# Patient Record
Sex: Female | Born: 1975 | Race: Black or African American | Hispanic: No | Marital: Single | Smoking: Former smoker
Health system: Southern US, Community
[De-identification: ages and names within clinical notes are randomized; demographics above are authoritative.]

## PROBLEM LIST (undated history)

## (undated) DIAGNOSIS — E119 Type 2 diabetes mellitus without complications: Secondary | ICD-10-CM

## (undated) DIAGNOSIS — M79604 Pain in right leg: Secondary | ICD-10-CM

## (undated) DIAGNOSIS — K219 Gastro-esophageal reflux disease without esophagitis: Secondary | ICD-10-CM

## (undated) DIAGNOSIS — F909 Attention-deficit hyperactivity disorder, unspecified type: Secondary | ICD-10-CM

## (undated) DIAGNOSIS — M542 Cervicalgia: Secondary | ICD-10-CM

## (undated) DIAGNOSIS — F419 Anxiety disorder, unspecified: Secondary | ICD-10-CM

## (undated) DIAGNOSIS — Z8489 Family history of other specified conditions: Secondary | ICD-10-CM

## (undated) DIAGNOSIS — Z87442 Personal history of urinary calculi: Secondary | ICD-10-CM

## (undated) HISTORY — DX: Pain in right leg: M79.604

## (undated) HISTORY — PX: OTHER SURGICAL HISTORY: SHX169

## (undated) HISTORY — DX: Attention-deficit hyperactivity disorder, unspecified type: F90.9

## (undated) HISTORY — DX: Type 2 diabetes mellitus without complications: E11.9

## (undated) HISTORY — PX: CHOLECYSTECTOMY: SHX55

## (undated) HISTORY — DX: Anxiety disorder, unspecified: F41.9

## (undated) HISTORY — DX: Cervicalgia: M54.2

---

## 2000-11-29 ENCOUNTER — Emergency Department (HOSPITAL_COMMUNITY): Admission: EM | Admit: 2000-11-29 | Discharge: 2000-11-29 | Payer: Self-pay | Admitting: Emergency Medicine

## 2001-01-26 ENCOUNTER — Emergency Department (HOSPITAL_COMMUNITY): Admission: EM | Admit: 2001-01-26 | Discharge: 2001-01-26 | Payer: Self-pay | Admitting: Internal Medicine

## 2001-10-20 ENCOUNTER — Emergency Department (HOSPITAL_COMMUNITY): Admission: EM | Admit: 2001-10-20 | Discharge: 2001-10-20 | Payer: Self-pay | Admitting: Emergency Medicine

## 2004-01-03 ENCOUNTER — Encounter: Admission: RE | Admit: 2004-01-03 | Discharge: 2004-01-03 | Payer: Self-pay | Admitting: Obstetrics & Gynecology

## 2005-03-30 ENCOUNTER — Emergency Department (HOSPITAL_COMMUNITY): Admission: EM | Admit: 2005-03-30 | Discharge: 2005-03-30 | Payer: Self-pay | Admitting: Family Medicine

## 2005-04-02 ENCOUNTER — Ambulatory Visit: Payer: Self-pay | Admitting: Internal Medicine

## 2005-04-09 ENCOUNTER — Ambulatory Visit: Payer: Self-pay | Admitting: Internal Medicine

## 2005-05-14 ENCOUNTER — Ambulatory Visit: Payer: Self-pay | Admitting: Internal Medicine

## 2005-09-08 ENCOUNTER — Emergency Department (HOSPITAL_COMMUNITY): Admission: EM | Admit: 2005-09-08 | Discharge: 2005-09-08 | Payer: Self-pay | Admitting: Family Medicine

## 2005-10-12 ENCOUNTER — Emergency Department (HOSPITAL_COMMUNITY): Admission: EM | Admit: 2005-10-12 | Discharge: 2005-10-12 | Payer: Self-pay | Admitting: Family Medicine

## 2005-12-25 ENCOUNTER — Emergency Department (HOSPITAL_COMMUNITY): Admission: EM | Admit: 2005-12-25 | Discharge: 2005-12-25 | Payer: Self-pay | Admitting: Family Medicine

## 2005-12-28 ENCOUNTER — Emergency Department (HOSPITAL_COMMUNITY): Admission: EM | Admit: 2005-12-28 | Discharge: 2005-12-28 | Payer: Self-pay | Admitting: Family Medicine

## 2006-01-04 ENCOUNTER — Emergency Department (HOSPITAL_COMMUNITY): Admission: EM | Admit: 2006-01-04 | Discharge: 2006-01-04 | Payer: Self-pay | Admitting: Family Medicine

## 2006-03-13 ENCOUNTER — Emergency Department (HOSPITAL_COMMUNITY): Admission: EM | Admit: 2006-03-13 | Discharge: 2006-03-13 | Payer: Self-pay | Admitting: Emergency Medicine

## 2006-05-28 ENCOUNTER — Other Ambulatory Visit: Admission: RE | Admit: 2006-05-28 | Discharge: 2006-05-28 | Payer: Self-pay | Admitting: Gynecology

## 2006-06-28 ENCOUNTER — Ambulatory Visit: Payer: Self-pay | Admitting: Family Medicine

## 2006-11-25 ENCOUNTER — Emergency Department (HOSPITAL_COMMUNITY): Admission: EM | Admit: 2006-11-25 | Discharge: 2006-11-25 | Payer: Self-pay | Admitting: Emergency Medicine

## 2006-12-28 ENCOUNTER — Ambulatory Visit (HOSPITAL_COMMUNITY): Admission: RE | Admit: 2006-12-28 | Discharge: 2006-12-28 | Payer: Self-pay | Admitting: Obstetrics and Gynecology

## 2006-12-28 ENCOUNTER — Encounter (HOSPITAL_COMMUNITY): Payer: Self-pay | Admitting: Obstetrics and Gynecology

## 2007-02-11 ENCOUNTER — Ambulatory Visit: Payer: Self-pay | Admitting: Internal Medicine

## 2007-02-11 LAB — CONVERTED CEMR LAB
Chlamydia, Swab/Urine, PCR: NEGATIVE
GC Probe Amp, Urine: NEGATIVE

## 2007-09-16 ENCOUNTER — Emergency Department (HOSPITAL_COMMUNITY): Admission: EM | Admit: 2007-09-16 | Discharge: 2007-09-16 | Payer: Self-pay | Admitting: Family Medicine

## 2007-11-28 ENCOUNTER — Ambulatory Visit: Payer: Self-pay | Admitting: Internal Medicine

## 2008-01-13 ENCOUNTER — Other Ambulatory Visit: Admission: RE | Admit: 2008-01-13 | Discharge: 2008-01-13 | Payer: Self-pay | Admitting: Adult Health

## 2008-01-13 ENCOUNTER — Encounter (INDEPENDENT_AMBULATORY_CARE_PROVIDER_SITE_OTHER): Payer: Self-pay | Admitting: Adult Health

## 2008-01-13 ENCOUNTER — Ambulatory Visit: Payer: Self-pay | Admitting: Internal Medicine

## 2008-01-13 LAB — CONVERTED CEMR LAB
ALT: 11 units/L (ref 0–35)
AST: 15 units/L (ref 0–37)
Albumin: 4.3 g/dL (ref 3.5–5.2)
Alkaline Phosphatase: 44 units/L (ref 39–117)
BUN: 11 mg/dL (ref 6–23)
Basophils Absolute: 0 10*3/uL (ref 0.0–0.1)
Basophils Relative: 1 % (ref 0–1)
CO2: 24 meq/L (ref 19–32)
Calcium: 9 mg/dL (ref 8.4–10.5)
Chlamydia, DNA Probe: NEGATIVE
Chloride: 103 meq/L (ref 96–112)
Cholesterol: 160 mg/dL (ref 0–200)
Creatinine, Ser: 0.66 mg/dL (ref 0.40–1.20)
Eosinophils Absolute: 0.1 10*3/uL (ref 0.0–0.7)
Eosinophils Relative: 1 % (ref 0–5)
GC Probe Amp, Genital: NEGATIVE
Glucose, Bld: 79 mg/dL (ref 70–99)
HCT: 39.5 % (ref 36.0–46.0)
HDL: 51 mg/dL (ref >39)
Hemoglobin: 12.2 g/dL (ref 12.0–15.0)
LDL Cholesterol: 98 mg/dL (ref 0–99)
Lymphocytes Relative: 38 % (ref 12–46)
Lymphs Abs: 2.5 10*3/uL (ref 0.7–4.0)
MCHC: 30.9 g/dL (ref 30.0–36.0)
MCV: 78.7 fL (ref 78.0–100.0)
Monocytes Absolute: 0.4 10*3/uL (ref 0.1–1.0)
Monocytes Relative: 6 % (ref 3–12)
Neutro Abs: 3.6 10*3/uL (ref 1.7–7.7)
Neutrophils Relative %: 55 % (ref 43–77)
Platelets: 296 10*3/uL (ref 150–400)
Potassium: 4.1 meq/L (ref 3.5–5.3)
RBC: 5.02 M/uL (ref 3.87–5.11)
RDW: 15 % (ref 11.5–15.5)
Sodium: 142 meq/L (ref 135–145)
TSH: 0.635 microintl units/mL (ref 0.350–4.50)
Total Bilirubin: 0.7 mg/dL (ref 0.3–1.2)
Total CHOL/HDL Ratio: 3.1
Total Protein: 7.4 g/dL (ref 6.0–8.3)
Triglycerides: 56 mg/dL (ref <150)
VLDL: 11 mg/dL (ref 0–40)
WBC: 6.6 10*3/uL (ref 4.0–10.5)

## 2008-08-13 ENCOUNTER — Emergency Department (HOSPITAL_COMMUNITY): Admission: EM | Admit: 2008-08-13 | Discharge: 2008-08-13 | Payer: Self-pay | Admitting: Family Medicine

## 2008-09-26 ENCOUNTER — Emergency Department (HOSPITAL_COMMUNITY): Admission: EM | Admit: 2008-09-26 | Discharge: 2008-09-26 | Payer: Self-pay | Admitting: Family Medicine

## 2008-09-28 ENCOUNTER — Encounter: Admission: RE | Admit: 2008-09-28 | Discharge: 2008-09-28 | Payer: Self-pay | Admitting: Family Medicine

## 2010-04-12 LAB — HEMOCCULT GUIAC POC 1CARD (OFFICE): Fecal Occult Bld: NEGATIVE

## 2010-04-15 ENCOUNTER — Inpatient Hospital Stay (INDEPENDENT_AMBULATORY_CARE_PROVIDER_SITE_OTHER)
Admission: RE | Admit: 2010-04-15 | Discharge: 2010-04-15 | Disposition: A | Discharge status: Home / Self Care | Payer: Self-pay | Source: Ambulatory Visit | Attending: Family Medicine | Admitting: Family Medicine

## 2010-04-15 DIAGNOSIS — L2089 Other atopic dermatitis: Secondary | ICD-10-CM

## 2010-05-20 NOTE — Op Note (Signed)
NAME:  [name], [name]               ACCOUNT NO.:  [id number]   MEDICAL RECORD NO.:  [id number]          PATIENT TYPE:  AMB   LOCATION:  [facility]C                           FACILITY:  [facility]   PHYSICIAN:  Zelphia Cairo, MD    DATE OF BIRTH:  1975-07-30   DATE OF PROCEDURE:  12/28/2006  DATE OF DISCHARGE:                               OPERATIVE REPORT   PREOPERATIVE DIAGNOSIS:  1. Missed abortion.  2. Anembryonic pregnancy.   POSTOPERATIVE DIAGNOSIS:  1. Missed abortion.  2. Anembryonic pregnancy.   PROCEDURE:  1. Cervical block.  2. Suction D&E.   SURGEON:  Zelphia Cairo, MD.   ASSISTANT:  None.   ANESTHESIA:  MAC and local.   SPECIMEN:  Products of conception to pathology.   ESTIMATED BLOOD LOSS:  Minimal.   COMPLICATIONS:  None.   CONDITION:  Stable to recovery room.   DESCRIPTION OF PROCEDURE:  The patient was taken to the operating room  where MAC anesthesia was obtained. She was placed in the dorsal  lithotomy position using Allen stirrups.  She was prepped and draped in  sterile fashion and an in-and-out catheter was used to drain her bladder  for approximately 20 mL of clear urine.  A bivalve speculum was placed  in the vagina and a single-tooth tenaculum was placed on the anterior  lip of the cervix. Nesacaine was used to provide a cervical block.  The  cervix was then easily dilated using Pratt dilators and a 7-French  suction catheter was inserted into the uterine cavity and products of  conception were evacuated from the uterus.  A gentle curette was then  performed to ensure uterine cry and that all products of conception had  been  removed.  The suction catheter was then reinserted to remove all clots  and debris. A single-tooth tenaculum and speculum were removed and the  patient was taken to the recovery room in stable condition.  Sponge,  lap, needle and instrument counts were correct x2.      Zelphia Cairo, MD  Electronically Signed     GA/MEDQ   D:  12/28/2006  T:  12/28/2006  Job:  [id number]

## 2010-05-23 NOTE — Letter (Signed)
July 23, 2005     Ms. Rose [name]  [address]  [place], [place] [address]   RE:  [name], Rose  MRN:  [id number]  /  DOB:  [birth year]   Dear Ms. Gariepy:   You have failed to show up for your colonoscopy.  As you may recall, this  has been scheduled to investigate the bleeding and bowel habits changes you  have had.   This is an important medical test to help understand why you have these  problems.   I recommend that you call our office and arrange a followup visit with me to  discuss these matters to try to complete your evaluation.    Sincerely,      Iva Boop, MD, [facility]   CEG/MedQ  DD:  07/23/2005  DT:  07/24/2005  Job #:  [id number]

## 2010-10-10 LAB — CBC
HCT: 35.8 — ABNORMAL LOW
Hemoglobin: 12
MCHC: 33.4
MCV: 78.2
Platelets: 252
RBC: 4.58
RDW: 14.3
WBC: 8.2

## 2010-10-14 LAB — POCT URINALYSIS DIP (DEVICE)
Glucose, UA: NEGATIVE
Ketones, ur: 15 — AB
Nitrite: NEGATIVE
Operator id: 247071
Protein, ur: 30 — AB
Specific Gravity, Urine: 1.02
Urobilinogen, UA: 1
pH: 7

## 2010-10-14 LAB — POCT PREGNANCY, URINE
Operator id: 247071
Preg Test, Ur: POSITIVE

## 2012-09-13 ENCOUNTER — Encounter: Payer: 59 | Attending: Internal Medicine

## 2012-09-13 VITALS — Ht 65.0 in | Wt 162.9 lb

## 2012-09-13 DIAGNOSIS — E119 Type 2 diabetes mellitus without complications: Secondary | ICD-10-CM | POA: Insufficient documentation

## 2012-09-13 DIAGNOSIS — Z713 Dietary counseling and surveillance: Secondary | ICD-10-CM | POA: Insufficient documentation

## 2012-09-20 NOTE — Patient Instructions (Signed)
Goals:  Follow Diabetes Meal Plan as instructed  Eat 3 meals and 2 snacks, every 3-5 hrs  Limit carbohydrate intake to 45 grams carbohydrate/meal  Limit carbohydrate intake to 30 grams carbohydrate/snack  Add lean protein foods to meals/snacks  Monitor glucose levels as instructed by your doctor  Aim for 15-30 mins of physical activity daily  Bring food record and glucose log to your next nutrition visit

## 2012-09-20 NOTE — Progress Notes (Signed)
Patient was seen on 09/13/12 for the first of a series of three diabetes self-management courses at the [facility].   Current HbA1c: 9.3%  The following learning objectives were met by the patient during this course:   Defines the role of glucose and insulin  Identifies type of diabetes and pathophysiology  Defines the diagnostic criteria for diabetes and prediabetes  States the risk factors for Type 2 Diabetes  States the symptoms of Type 2 Diabetes  Defines Type 2 Diabetes treatment goals  Defines Type 2 Diabetes treatment options  States the rationale for glucose monitoring  Identifies A1C, glucose targets, and testing times  Identifies proper sharps disposal  Defines the purpose of a diabetes food plan  Identifies carbohydrate food groups  Defines effects of carbohydrate foods on glucose levels  Identifies carbohydrate choices/grams/food labels  States benefits of physical activity and effect on glucose  Review of suggested activity guidelines  Handouts given during class include:  Type 2 Diabetes: Basics Book  My Food Plan Book  Food and Activity Log  Your patient has identified their diabetes self-care support plan as:  [facility] support group  Continued diabetes education  Follow-Up Plan: Attend core 2 and core 3

## 2012-10-18 ENCOUNTER — Encounter: Payer: 59 | Attending: Internal Medicine

## 2012-10-18 DIAGNOSIS — E119 Type 2 diabetes mellitus without complications: Secondary | ICD-10-CM | POA: Insufficient documentation

## 2012-10-18 DIAGNOSIS — Z713 Dietary counseling and surveillance: Secondary | ICD-10-CM | POA: Insufficient documentation

## 2012-10-25 ENCOUNTER — Encounter: Payer: 59 | Attending: Internal Medicine

## 2012-10-25 DIAGNOSIS — Z713 Dietary counseling and surveillance: Secondary | ICD-10-CM | POA: Insufficient documentation

## 2012-10-25 DIAGNOSIS — E119 Type 2 diabetes mellitus without complications: Secondary | ICD-10-CM

## 2012-10-26 NOTE — Progress Notes (Signed)
Patient was seen on 10/25/12 for the third of a series of three diabetes self-management courses at the [facility]. The following learning objectives were met by the patient during this class:    State the amount of activity recommended for healthy living   Describe activities suitable for individual needs   Identify ways to regularly incorporate activity into daily life   Identify barriers to activity and ways to over come these barriers  Identify diabetes medications being personally used and their primary action for lowering glucose and possible side effects   Describe role of stress on blood glucose and develop strategies to address psychosocial issues   Identify diabetes complications and ways to prevent them  Explain how to manage diabetes during illness   Evaluate success in meeting personal goal   Establish 2-3 goals that they will plan to diligently work on until they return for the free 63-month follow-up visit  Your patient has established the following 4 month goals in their individualized success plan:  Count Carbohydrates at most meals and snacks  Be active 30 minutes or more 5 times a week  Take my medications as directed  Your patient has identified these potential barriers to change:  None stated  Your patient has identified their diabetes self-care support plan as  None stated

## 2012-12-06 DIAGNOSIS — J4520 Mild intermittent asthma, uncomplicated: Secondary | ICD-10-CM | POA: Insufficient documentation

## 2012-12-06 DIAGNOSIS — F418 Other specified anxiety disorders: Secondary | ICD-10-CM | POA: Insufficient documentation

## 2012-12-06 DIAGNOSIS — Z3041 Encounter for surveillance of contraceptive pills: Secondary | ICD-10-CM | POA: Insufficient documentation

## 2012-12-06 DIAGNOSIS — E1165 Type 2 diabetes mellitus with hyperglycemia: Secondary | ICD-10-CM | POA: Insufficient documentation

## 2013-03-04 ENCOUNTER — Emergency Department: Payer: Self-pay | Admitting: Emergency Medicine

## 2013-04-20 DIAGNOSIS — F39 Unspecified mood [affective] disorder: Secondary | ICD-10-CM | POA: Insufficient documentation

## 2013-06-30 ENCOUNTER — Emergency Department: Payer: Self-pay | Admitting: Internal Medicine

## 2013-08-18 ENCOUNTER — Encounter (HOSPITAL_COMMUNITY): Payer: Self-pay

## 2013-08-18 ENCOUNTER — Encounter (HOSPITAL_COMMUNITY)
Admission: RE | Admit: 2013-08-18 | Discharge: 2013-08-18 | Disposition: A | Discharge status: Home / Self Care | Payer: 59 | Source: Ambulatory Visit | Attending: Obstetrics and Gynecology | Admitting: Obstetrics and Gynecology

## 2013-08-18 DIAGNOSIS — Z01812 Encounter for preprocedural laboratory examination: Secondary | ICD-10-CM | POA: Insufficient documentation

## 2013-08-18 LAB — BASIC METABOLIC PANEL
Anion gap: 12 (ref 5–15)
BUN: 14 mg/dL (ref 6–23)
CO2: 25 mEq/L (ref 19–32)
Calcium: 9.2 mg/dL (ref 8.4–10.5)
Chloride: 102 mEq/L (ref 96–112)
Creatinine, Ser: 0.81 mg/dL (ref 0.50–1.10)
GFR calc Af Amer: >90 mL/min (ref >90)
GFR calc non Af Amer: >90 mL/min (ref >90)
Glucose, Bld: 111 mg/dL — ABNORMAL HIGH (ref 70–99)
Potassium: 3.8 mEq/L (ref 3.7–5.3)
Sodium: 139 mEq/L (ref 137–147)

## 2013-08-18 LAB — CBC
HCT: 37.6 % (ref 36.0–46.0)
Hemoglobin: 12.4 g/dL (ref 12.0–15.0)
MCH: 24.8 pg — ABNORMAL LOW (ref 26.0–34.0)
MCHC: 33 g/dL (ref 30.0–36.0)
MCV: 75.2 fL — ABNORMAL LOW (ref 78.0–100.0)
Platelets: 268 10*3/uL (ref 150–400)
RBC: 5 MIL/uL (ref 3.87–5.11)
RDW: 15.7 % — ABNORMAL HIGH (ref 11.5–15.5)
WBC: 7.4 10*3/uL (ref 4.0–10.5)

## 2013-08-18 NOTE — Patient Instructions (Addendum)
Your procedure is scheduled on:08/24/13  Enter through the Main Entrance at :[facility] up desk phone and dial [phone] and inform us of your arrival.  Please call [phone] if you have any problems the morning of surgery.  Remember: Do not eat food or drink liquids, including water, after midnight:WED  You may brush your teeth the morning of surgery. Do not take morning meds before surgery  DO NOT wear jewelry, eye make-up, lipstick,body lotion, or dark fingernail polish.  (Polished toes are ok) You may wear deodorant.  If you are to be admitted after surgery, leave suitcase in car until your room has been assigned. Patients discharged on the day of surgery will not be allowed to drive home. Wear loose fitting, comfortable clothes for your ride home.

## 2013-08-23 NOTE — H&P (Addendum)
Rose [name] is an 38 y.o. female with chronic pelvic pain presents for further evaluation.  Started on DMPA for menorrhagia and dysmenorrhea.  Bleeding has improved but pain persists.      Past Medical History  Diagnosis Date  . Diabetes mellitus without complication   . Asthma   . Anxiety     Past Surgical History  Procedure Laterality Date  . Wrist, cyst removal    . Cesarean section    . Cholecystectomy      No family history on file.  Social History:  reports that she has never smoked. She does not have any smokeless tobacco history on file. She reports that she drinks alcohol. She reports that she does not use illicit drugs.  Allergies:  Allergies  Allergen Reactions  . Bactrim [Sulfamethoxazole-Tmp Ds] Swelling    No prescriptions prior to admission    ROS  Physical Exam  AF, VSS Gen - NAD Abd - soft, ND CV - RRR Lungs - clear PV - no masses  [place] 2.7cm fibroid.  No adnexal masses.  ? Adenomyosis.  No free fluid   Assessment/Plan:  Chronic pelvic pain Diagnostic laparoscopy with possible fulgeration of endometriosis R/b/a discussed, questions answered, informed consent  Rose [name] 08/23/2013, 5:14 PM

## 2013-08-24 ENCOUNTER — Ambulatory Visit (HOSPITAL_COMMUNITY)
Admission: RE | Admit: 2013-08-24 | Discharge: 2013-08-24 | Disposition: A | Discharge status: Home / Self Care | Payer: 59 | Source: Ambulatory Visit | Attending: Obstetrics and Gynecology | Admitting: Obstetrics and Gynecology

## 2013-08-24 ENCOUNTER — Encounter (HOSPITAL_COMMUNITY): Payer: Self-pay | Admitting: *Deleted

## 2013-08-24 ENCOUNTER — Ambulatory Visit (HOSPITAL_COMMUNITY): Payer: 59 | Admitting: Anesthesiology

## 2013-08-24 ENCOUNTER — Encounter (HOSPITAL_COMMUNITY): Payer: 59 | Admitting: Anesthesiology

## 2013-08-24 ENCOUNTER — Encounter (HOSPITAL_COMMUNITY)
Admission: RE | Disposition: A | Discharge status: Home / Self Care | Payer: Self-pay | Source: Ambulatory Visit | Attending: Obstetrics and Gynecology

## 2013-08-24 DIAGNOSIS — G8929 Other chronic pain: Secondary | ICD-10-CM | POA: Insufficient documentation

## 2013-08-24 DIAGNOSIS — N920 Excessive and frequent menstruation with regular cycle: Secondary | ICD-10-CM | POA: Insufficient documentation

## 2013-08-24 DIAGNOSIS — N949 Unspecified condition associated with female genital organs and menstrual cycle: Secondary | ICD-10-CM | POA: Diagnosis not present

## 2013-08-24 DIAGNOSIS — J45909 Unspecified asthma, uncomplicated: Secondary | ICD-10-CM | POA: Insufficient documentation

## 2013-08-24 DIAGNOSIS — N946 Dysmenorrhea, unspecified: Secondary | ICD-10-CM | POA: Diagnosis not present

## 2013-08-24 DIAGNOSIS — E119 Type 2 diabetes mellitus without complications: Secondary | ICD-10-CM | POA: Diagnosis not present

## 2013-08-24 DIAGNOSIS — F411 Generalized anxiety disorder: Secondary | ICD-10-CM | POA: Insufficient documentation

## 2013-08-24 HISTORY — PX: LAPAROSCOPY: SHX197

## 2013-08-24 LAB — GLUCOSE, CAPILLARY
Glucose-Capillary: 100 mg/dL — ABNORMAL HIGH (ref 70–99)
Glucose-Capillary: 102 mg/dL — ABNORMAL HIGH (ref 70–99)

## 2013-08-24 LAB — PREGNANCY, URINE: Preg Test, Ur: NEGATIVE

## 2013-08-24 SURGERY — LAPAROSCOPY OPERATIVE
Anesthesia: General | Site: Abdomen

## 2013-08-24 MED ORDER — DEXAMETHASONE SODIUM PHOSPHATE 10 MG/ML IJ SOLN
INTRAMUSCULAR | Status: DC | PRN
  Administered 2013-08-24: 4 mg via INTRAVENOUS

## 2013-08-24 MED ORDER — HYDROCODONE-IBUPROFEN 7.5-200 MG PO TABS: 1.0000 | ORAL_TABLET | Freq: Three times a day (TID) | ORAL | Status: DC | PRN

## 2013-08-24 MED ORDER — MIDAZOLAM HCL 2 MG/2ML IJ SOLN: 0.5000 mg | Freq: Once | INTRAMUSCULAR | Status: DC | PRN

## 2013-08-24 MED ORDER — SCOPOLAMINE 1 MG/3DAYS TD PT72
1.0000 | MEDICATED_PATCH | Freq: Once | TRANSDERMAL | Status: DC
  Administered 2013-08-24: 1.5 mg via TRANSDERMAL

## 2013-08-24 MED ORDER — HYDROMORPHONE HCL PF 1 MG/ML IJ SOLN
0.2500 mg | INTRAMUSCULAR | Status: DC | PRN
  Administered 2013-08-24: 0.5 mg via INTRAVENOUS

## 2013-08-24 MED ORDER — DEXAMETHASONE SODIUM PHOSPHATE 4 MG/ML IJ SOLN
INTRAMUSCULAR | Status: AC
  Filled 2013-08-24: qty 1

## 2013-08-24 MED ORDER — LIDOCAINE HCL (CARDIAC) 20 MG/ML IV SOLN
INTRAVENOUS | Status: AC
  Filled 2013-08-24: qty 5

## 2013-08-24 MED ORDER — GLYCOPYRROLATE 0.2 MG/ML IJ SOLN
INTRAMUSCULAR | Status: AC
  Filled 2013-08-24: qty 1

## 2013-08-24 MED ORDER — ONDANSETRON HCL 4 MG/2ML IJ SOLN
INTRAMUSCULAR | Status: DC | PRN
  Administered 2013-08-24: 4 mg via INTRAVENOUS

## 2013-08-24 MED ORDER — MIDAZOLAM HCL 2 MG/2ML IJ SOLN
INTRAMUSCULAR | Status: AC
Start: 2013-08-24 — End: 2013-08-24
  Filled 2013-08-24: qty 2

## 2013-08-24 MED ORDER — GLYCOPYRROLATE 0.2 MG/ML IJ SOLN
INTRAMUSCULAR | Status: DC | PRN
  Administered 2013-08-24: .5 mg via INTRAVENOUS

## 2013-08-24 MED ORDER — MIDAZOLAM HCL 2 MG/2ML IJ SOLN
INTRAMUSCULAR | Status: DC | PRN
  Administered 2013-08-24: 2 mg via INTRAVENOUS

## 2013-08-24 MED ORDER — LIDOCAINE HCL (CARDIAC) 20 MG/ML IV SOLN
INTRAVENOUS | Status: DC | PRN
  Administered 2013-08-24: 70 mg via INTRAVENOUS

## 2013-08-24 MED ORDER — PROMETHAZINE HCL 25 MG/ML IJ SOLN: 6.2500 mg | INTRAMUSCULAR | Status: DC | PRN

## 2013-08-24 MED ORDER — PROPOFOL 10 MG/ML IV EMUL
INTRAVENOUS | Status: AC
  Filled 2013-08-24: qty 20

## 2013-08-24 MED ORDER — ROCURONIUM BROMIDE 100 MG/10ML IV SOLN
INTRAVENOUS | Status: AC
  Filled 2013-08-24: qty 1

## 2013-08-24 MED ORDER — HYDROMORPHONE HCL PF 1 MG/ML IJ SOLN
INTRAMUSCULAR | Status: AC
  Administered 2013-08-24: 0.5 mg via INTRAVENOUS
  Filled 2013-08-24: qty 1

## 2013-08-24 MED ORDER — SCOPOLAMINE 1 MG/3DAYS TD PT72
MEDICATED_PATCH | TRANSDERMAL | Status: AC
  Filled 2013-08-24: qty 1

## 2013-08-24 MED ORDER — ONDANSETRON HCL 4 MG/2ML IJ SOLN
INTRAMUSCULAR | Status: AC
  Filled 2013-08-24: qty 2

## 2013-08-24 MED ORDER — FENTANYL CITRATE 0.05 MG/ML IJ SOLN
INTRAMUSCULAR | Status: DC | PRN
  Administered 2013-08-24 (×2): 100 ug via INTRAVENOUS

## 2013-08-24 MED ORDER — KETOROLAC TROMETHAMINE 30 MG/ML IJ SOLN: 15.0000 mg | Freq: Once | INTRAMUSCULAR | Status: DC | PRN

## 2013-08-24 MED ORDER — LACTATED RINGERS IV SOLN
INTRAVENOUS | Status: DC
  Administered 2013-08-24: 08:00:00 via INTRAVENOUS
  Administered 2013-08-24: 1000 mL via INTRAVENOUS

## 2013-08-24 MED ORDER — CEFOTETAN DISODIUM 2 G IJ SOLR
2.0000 g | INTRAMUSCULAR | Status: AC
  Administered 2013-08-24: 2 g via INTRAVENOUS
  Filled 2013-08-24: qty 2

## 2013-08-24 MED ORDER — KETOROLAC TROMETHAMINE 30 MG/ML IJ SOLN
INTRAMUSCULAR | Status: DC | PRN
  Administered 2013-08-24: 30 mg via INTRAVENOUS

## 2013-08-24 MED ORDER — FENTANYL CITRATE 0.05 MG/ML IJ SOLN
INTRAMUSCULAR | Status: AC
  Filled 2013-08-24: qty 5

## 2013-08-24 MED ORDER — BUPIVACAINE HCL (PF) 0.25 % IJ SOLN
INTRAMUSCULAR | Status: DC | PRN
  Administered 2013-08-24: 6 mL

## 2013-08-24 MED ORDER — NEOSTIGMINE METHYLSULFATE 10 MG/10ML IV SOLN
INTRAVENOUS | Status: AC
  Filled 2013-08-24: qty 1

## 2013-08-24 MED ORDER — NEOSTIGMINE METHYLSULFATE 10 MG/10ML IV SOLN
INTRAVENOUS | Status: DC | PRN
  Administered 2013-08-24: 3.5 mg via INTRAVENOUS

## 2013-08-24 MED ORDER — BUPIVACAINE HCL (PF) 0.25 % IJ SOLN
INTRAMUSCULAR | Status: AC
  Filled 2013-08-24: qty 30

## 2013-08-24 MED ORDER — ROCURONIUM BROMIDE 100 MG/10ML IV SOLN
INTRAVENOUS | Status: DC | PRN
  Administered 2013-08-24: 40 mg via INTRAVENOUS

## 2013-08-24 MED ORDER — GLYCOPYRROLATE 0.2 MG/ML IJ SOLN
INTRAMUSCULAR | Status: AC
  Filled 2013-08-24: qty 2

## 2013-08-24 MED ORDER — PROPOFOL 10 MG/ML IV BOLUS
INTRAVENOUS | Status: DC | PRN
  Administered 2013-08-24: 150 mg via INTRAVENOUS

## 2013-08-24 MED ORDER — KETOROLAC TROMETHAMINE 30 MG/ML IJ SOLN
INTRAMUSCULAR | Status: AC
  Filled 2013-08-24: qty 1

## 2013-08-24 MED ORDER — MEPERIDINE HCL 25 MG/ML IJ SOLN: 6.2500 mg | INTRAMUSCULAR | Status: DC | PRN

## 2013-08-24 SURGICAL SUPPLY — 29 items
BLADE SURG 11 STRL SS (BLADE) ×3 IMPLANT
CATH ROBINSON RED A/P 16FR (CATHETERS) ×3 IMPLANT
CLOSURE WOUND 1/2 X4 (GAUZE/BANDAGES/DRESSINGS)
CLOTH BEACON ORANGE TIMEOUT ST (SAFETY) ×3 IMPLANT
DERMABOND ADVANCED (GAUZE/BANDAGES/DRESSINGS) ×2
DERMABOND ADVANCED .7 DNX12 (GAUZE/BANDAGES/DRESSINGS) ×1 IMPLANT
DRSG COVADERM PLUS 2X2 (GAUZE/BANDAGES/DRESSINGS) ×3 IMPLANT
DRSG OPSITE POSTOP 3X4 (GAUZE/BANDAGES/DRESSINGS) ×3 IMPLANT
DURAPREP 26ML APPLICATOR (WOUND CARE) ×3 IMPLANT
GLOVE BIO SURGEON STRL SZ 6.5 (GLOVE) ×4 IMPLANT
GLOVE BIO SURGEONS STRL SZ 6.5 (GLOVE) ×2
GLOVE BIOGEL PI IND STRL 7.0 (GLOVE) ×1 IMPLANT
GLOVE BIOGEL PI INDICATOR 7.0 (GLOVE) ×2
GLOVE SURG SS PI 7.0 STRL IVOR (GLOVE) ×15 IMPLANT
GOWN STRL REUS W/TWL LRG LVL3 (GOWN DISPOSABLE) ×6 IMPLANT
NS IRRIG 1000ML POUR BTL (IV SOLUTION) ×3 IMPLANT
PACK LAPAROSCOPY BASIN (CUSTOM PROCEDURE TRAY) ×3 IMPLANT
PROTECTOR NERVE ULNAR (MISCELLANEOUS) ×6 IMPLANT
SEALER TISSUE G2 CVD JAW 45CM (ENDOMECHANICALS) ×3 IMPLANT
STRIP CLOSURE SKIN 1/2X4 (GAUZE/BANDAGES/DRESSINGS) IMPLANT
SUT VIC AB 3-0 PS2 18 (SUTURE) ×2
SUT VIC AB 3-0 PS2 18XBRD (SUTURE) ×1 IMPLANT
SUT VICRYL 0 UR6 27IN ABS (SUTURE) ×3 IMPLANT
TOWEL OR 17X24 6PK STRL BLUE (TOWEL DISPOSABLE) ×6 IMPLANT
TROCAR OPTI TIP 5M 100M (ENDOMECHANICALS) ×3 IMPLANT
TROCAR XCEL NON-BLD 11X100MML (ENDOMECHANICALS) ×3 IMPLANT
TROCAR XCEL OPT SLVE 5M 100M (ENDOMECHANICALS) ×3 IMPLANT
WARMER LAPAROSCOPE (MISCELLANEOUS) ×3 IMPLANT
WATER STERILE IRR 1000ML POUR (IV SOLUTION) ×3 IMPLANT

## 2013-08-24 NOTE — Anesthesia Preprocedure Evaluation (Signed)
Anesthesia Evaluation  Patient identified by MRN, date of birth, ID band Patient awake    Reviewed: Allergy & Precautions, H&P , Patient's Chart, lab work & pertinent test results, reviewed documented beta blocker date and time   History of Anesthesia Complications Negative for: history of anesthetic complications  Airway Mallampati: II TM Distance: >3 FB Neck ROM: full    Dental   Pulmonary asthma ,  breath sounds clear to auscultation        Cardiovascular Exercise Tolerance: Good Rhythm:regular Rate:Normal     Neuro/Psych PSYCHIATRIC DISORDERS Anxiety    GI/Hepatic   Endo/Other  diabetes  Renal/GU      Musculoskeletal   Abdominal   Peds  Hematology   Anesthesia Other Findings   Reproductive/Obstetrics                           Anesthesia Physical Anesthesia Plan  ASA: III  Anesthesia Plan: General ETT   Post-op Pain Management:    Induction:   Airway Management Planned:   Additional Equipment:   Intra-op Plan:   Post-operative Plan:   Informed Consent: I have reviewed the patients History and Physical, chart, labs and discussed the procedure including the risks, benefits and alternatives for the proposed anesthesia with the patient or authorized representative who has indicated his/her understanding and acceptance.   Dental Advisory Given  Plan Discussed with: CRNA and Surgeon  Anesthesia Plan Comments:         Anesthesia Quick Evaluation

## 2013-08-24 NOTE — Op Note (Signed)
NAME:  Rose [name], Rose [name]               ACCOUNT NO.:  [id number]  MEDICAL RECORD NO.:  [id number]  LOCATION:  [facility]                          FACILITY:  [facility]  PHYSICIAN:  Marylynn Pearson, MD    DATE OF BIRTH:  01-18-1975  DATE OF PROCEDURE:  08/24/2013 DATE OF DISCHARGE:                              OPERATIVE REPORT   PREOPERATIVE DIAGNOSIS:  Chronic pelvic pain.  POSTOPERATIVE DIAGNOSES: 1. Chronic pelvic pain. 2. Adhesions.  PROCEDURE:  Diagnostic laparoscopy with lysis of adhesions.  SURGEON:  Marylynn Pearson, MD.  ANESTHESIA:  General.  COMPLICATIONS:  None.  CONDITION:  Stable to recovery room.  BLOOD LOSS:  Minimal.  PROCEDURE:  The patient was taken to the operating room after informed consent was obtained.  She was given general anesthesia, placed in a dorsal lithotomy position using Allen stirrups and prepped and draped in sterile fashion.  In and out catheter was used to drain her bladder. Bivalve speculum was placed in the vagina and a Hulka clamp was placed on the cervix.  Tenaculum and speculum were removed and our attention was turned to the abdomen.  0.25% Marcaine was used to provide local anesthesia.  An infraumbilical skin incision was made with a scalpel. This was extended to the fascia bluntly using a Kelly clamp.  Optical trocar was then inserted under direct visualization.  Once intraperitoneal placement was confirmed, CO2 was turned on and a survey was performed.  She was noted to have 2 areas of dense adhesions between the anterior uterus and the anterior abdominal wall.  Bilateral ovaries appeared normal.  Posterior cul-de-sac appeared normal.  No other adhesions identified.  No endometriosis identified.  Enseal device was then placed through the operative scope and a 5 mm trocar was inserted in the suprapubic region.  Adhesions were lysed using the Enseal device to allow for more mobility of the uterus.  All trocars and instruments were then removed  from the abdomen.  A deep stitch was placed in the infraumbilical incision and the skin of both incisions was closed with Vicryl and Dermabond was placed over both incisions.  The Hulka clamp was removed. The patient was extubated and taken to the recovery room in stable condition.  Sponge, lap, needle, and instrument counts were correct x2.     Marylynn Pearson, MD     GA/MEDQ  D:  08/24/2013  T:  08/24/2013  Job:  [id number]

## 2013-08-24 NOTE — Anesthesia Postprocedure Evaluation (Signed)
  Anesthesia Post-op Note  Patient: Rose [name]  Procedure(s) Performed: Procedure(s): LAPAROSCOPY OPERATIVE with lysis of adhesions  (N/A)  Patient Location: PACU  Anesthesia Type:General  Level of Consciousness: awake, alert  and oriented  Airway and Oxygen Therapy: Patient Spontanous Breathing  Post-op Pain: none  Post-op Assessment: Post-op Vital signs reviewed, Patient's Cardiovascular Status Stable, Respiratory Function Stable, Patent Airway, No signs of Nausea or vomiting and Pain level controlled  Post-op Vital Signs: Reviewed and stable  Last Vitals:  Filed Vitals:   08/24/13 0845  BP: 108/85  Pulse: 93  Temp:   Resp: 15    Complications: No apparent anesthesia complications

## 2013-08-24 NOTE — Transfer of Care (Signed)
Immediate Anesthesia Transfer of Care Note  Patient: Rose [name]  Procedure(s) Performed: Procedure(s): LAPAROSCOPY OPERATIVE with lysis of adhesions  (N/A)  Patient Location: PACU  Anesthesia Type:General  Level of Consciousness: awake, alert  and oriented  Airway & Oxygen Therapy: Patient Spontanous Breathing, [place] applied in PACU   Post-op Assessment: Report given to PACU RN and Post -op Vital signs reviewed and stable  Post vital signs: Reviewed and stable  Complications: No apparent anesthesia complications

## 2013-08-24 NOTE — Discharge Instructions (Signed)

## 2013-08-26 ENCOUNTER — Encounter (HOSPITAL_COMMUNITY): Payer: Self-pay | Admitting: Obstetrics and Gynecology

## 2013-09-15 ENCOUNTER — Emergency Department: Payer: Self-pay | Admitting: Emergency Medicine

## 2013-09-18 ENCOUNTER — Ambulatory Visit (INDEPENDENT_AMBULATORY_CARE_PROVIDER_SITE_OTHER): Payer: 59 | Admitting: Endocrinology

## 2013-09-18 ENCOUNTER — Encounter: Payer: Self-pay | Admitting: Endocrinology

## 2013-09-18 VITALS — BP 112/80 | HR 104 | Temp 98.9°F | Ht 64.0 in | Wt 159.0 lb

## 2013-09-18 DIAGNOSIS — E119 Type 2 diabetes mellitus without complications: Secondary | ICD-10-CM | POA: Insufficient documentation

## 2013-09-18 LAB — HEMOGLOBIN A1C: HEMOGLOBIN A1C: 9.1 % — AB (ref 4.6–6.5)

## 2013-09-18 MED ORDER — BROMOCRIPTINE MESYLATE 2.5 MG PO TABS: 1.2500 mg | ORAL_TABLET | Freq: Every day | ORAL | Status: DC

## 2013-09-18 MED ORDER — GLIMEPIRIDE 4 MG PO TABS: 4.0000 mg | ORAL_TABLET | Freq: Every day | ORAL | Status: DC

## 2013-09-18 MED ORDER — GLUCOSE BLOOD VI STRP: 1.0000 | ORAL_STRIP | Freq: Every day | Status: AC

## 2013-09-18 NOTE — Progress Notes (Signed)
Subjective:    [name], female    DOB: Mar 22, 1975, 38 y.o.   MRN: [id number]  HPI pt states DM was dx'ed in 2014, when she presented with blurry vision; she has mild if any neuropathy of the lower extremities; he is unaware of any associated chronic complications; she has never been on insulin; pt says his diet and exercise are poor; he has never had GDM, pancreatitis, severe hypoglycemia or DKA.  She says she is at risk for pregnancy.  She did not like the janumet (weight loss) or farxiga (vaginitis).     Past Medical History  Diagnosis Date  . Diabetes mellitus without complication   . Asthma   . Anxiety     Past Surgical History  Procedure Laterality Date  . Wrist, cyst removal    . Cesarean section    . Cholecystectomy    . Laparoscopy N/A 08/24/2013    Procedure: LAPAROSCOPY OPERATIVE with lysis of adhesions ;  Surgeon: Marylynn Pearson, MD;  Location: [facility];  Service: Gynecology;  Laterality: N/A;    History   Social History  . Marital Status: Single    Spouse Name: N/A    Number of Children: N/A  . Years of Education: N/A   Occupational History  . Not on file.   Social History Main Topics  . Smoking status: Never Smoker   . Smokeless tobacco: Not on file  . Alcohol Use: Yes     Comment: rarely  . Drug Use: No  . Sexual Activity: Not on file   Other Topics Concern  . Not on file   Social History Narrative  . No narrative on file    Current Outpatient Prescriptions on File Prior to Visit  Medication Sig Dispense Refill  . amphetamine-dextroamphetamine (ADDERALL) 20 MG tablet Take 20 mg by mouth 2 (two) times daily.       . clonazePAM (KLONOPIN) 1 MG tablet Take 1 mg by mouth 2 (two) times daily.      . Dapagliflozin Propanediol (FARXIGA) 10 MG TABS Take 1 tablet by mouth daily.      . Multiple Vitamins-Minerals (ANTIOXIDANT PO) Take 1 each by mouth daily.      Marland Kitchen omega-3 acid ethyl esters (LOVAZA) 1 G capsule Take 1 g by mouth daily.       . sitaGLIPtin (JANUVIA) 100 MG tablet Take 100 mg by mouth daily.      . Vitamins-Lipotropics (B-50 PO) Take 1 each by mouth daily.      Marland Kitchen albuterol (PROVENTIL HFA;VENTOLIN HFA) 108 (90 BASE) MCG/ACT inhaler Inhale 2 puffs into the lungs every 6 (six) hours as needed for wheezing or shortness of breath.       Marland Kitchen asenapine (SAPHRIS) 5 MG SUBL 24 hr tablet Place 5 mg under the tongue at bedtime.       No current facility-administered medications on file prior to visit.    Allergies  Allergen Reactions  . Bactrim [Sulfamethoxazole-Tmp Ds] Swelling    Family History  Problem Relation Age of Onset  . Diabetes Neg Hx     BP 112/80  Pulse 104  Temp(Src) 98.9 F (37.2 C) (Oral)  Ht 5\' 4"  (1.626 m)  Wt 159 lb (72.122 kg)  BMI 27.28 kg/m2  SpO2 94%    Review of Systems denies blurry vision, headache, chest pain, sob, n/v, urinary frequency, muscle cramps, excessive diaphoresis, cold intolerance, rhinorrhea, and easy bruising.  She has lost 15 lbs this year.  Depression  is improved.     Objective:   Physical Exam VS: see vs page GEN: no distress HEAD: head: no deformity eyes: no periorbital swelling, no proptosis external nose and ears are normal mouth: no lesion seen NECK: supple, thyroid is not enlarged CHEST WALL: no deformity LUNGS:  Clear to auscultation CV: reg rate and rhythm, no murmur ABD: abdomen is soft, nontender.  no hepatosplenomegaly.  not distended.  no hernia MUSCULOSKELETAL: muscle bulk and strength are grossly normal.  no obvious joint swelling.  gait is normal and steady EXTEMITIES: no deformity.  no ulcer on the feet.  feet are of normal color and temp.  no edema PULSES: dorsalis pedis intact bilat.  no carotid bruit NEURO:  cn 2-12 grossly intact.   readily moves all 4's.  sensation is intact to touch on the feet SKIN:  Normal texture and temperature.  No rash or suspicious lesion is visible.   NODES:  None palpable at the neck.   PSYCH: alert,  well-oriented.  Does not appear anxious nor depressed.    i have reviewed the following outside records:  Office notes.    Lab Results  Component Value Date   HGBA1C 9.1* 09/18/2013      Assessment & Plan:  DM: severe exacerbation: i reviewed all oral DM med categories with pt.  ptis advised: in view of your medical condition, you should avoid pregnancy until we have decided it is safe.   Weight loss, new to me, possibly due to meds.   Vaginitis, possibly due to farxiga.     Patient is advised the following: Patient Instructions  good diet and exercise habits significanly improve the control of your diabetes.  please let me know if you wish to be referred to a dietician.  high blood sugar is very risky to your health.  you should see an eye doctor and dentist every year.  You are at higher than average risk for pneumonia and hepatitis-B.  You should be vaccinated against both.   controlling your blood pressure and cholesterol drastically reduces the damage diabetes does to your body.  this also applies to quitting smoking.  please discuss these with your doctor.  check your blood sugar once a day.  vary the time of day when you check, between before the 3 meals, and at bedtime.  also check if you have symptoms of your blood sugar being too high or too low.  please keep a record of the readings and bring it to your next appointment here.  You can write it on any piece of paper.  please call us sooner if your blood sugar goes below 70, or if you have a lot of readings over 200. A diabetes blood test is requested for you today.  We'll contact you with results.   Based on the results, i'll prescribe for you an additional diabetes pill, such as bromocriptine or glimepiride, or both.    addendum: add both bromocriptine and glimepiride.

## 2013-09-18 NOTE — Patient Instructions (Addendum)
good diet and exercise habits significanly improve the control of your diabetes.  please let me know if you wish to be referred to a dietician.  high blood sugar is very risky to your health.  you should see an eye doctor and dentist every year.  You are at higher than average risk for pneumonia and hepatitis-B.  You should be vaccinated against both.   controlling your blood pressure and cholesterol drastically reduces the damage diabetes does to your body.  this also applies to quitting smoking.  please discuss these with your doctor.  check your blood sugar once a day.  vary the time of day when you check, between before the 3 meals, and at bedtime.  also check if you have symptoms of your blood sugar being too high or too low.  please keep a record of the readings and bring it to your next appointment here.  You can write it on any piece of paper.  please call us sooner if your blood sugar goes below 70, or if you have a lot of readings over 200. A diabetes blood test is requested for you today.  We'll contact you with results.   Based on the results, i'll prescribe for you an additional diabetes pill, such as bromocriptine or glimepiride, or both.

## 2013-09-19 ENCOUNTER — Ambulatory Visit (INDEPENDENT_AMBULATORY_CARE_PROVIDER_SITE_OTHER): Payer: 59 | Admitting: Neurology

## 2013-09-19 ENCOUNTER — Encounter: Payer: Self-pay | Admitting: Neurology

## 2013-09-19 ENCOUNTER — Encounter (INDEPENDENT_AMBULATORY_CARE_PROVIDER_SITE_OTHER): Payer: Self-pay

## 2013-09-19 VITALS — BP 114/83 | HR 117 | Ht 64.0 in | Wt 155.0 lb

## 2013-09-19 DIAGNOSIS — M79604 Pain in right leg: Secondary | ICD-10-CM | POA: Insufficient documentation

## 2013-09-19 DIAGNOSIS — M542 Cervicalgia: Secondary | ICD-10-CM

## 2013-09-19 DIAGNOSIS — M79609 Pain in unspecified limb: Secondary | ICD-10-CM

## 2013-09-19 MED ORDER — PREGABALIN 50 MG PO CAPS: 50.0000 mg | ORAL_CAPSULE | Freq: Three times a day (TID) | ORAL | Status: DC

## 2013-09-19 NOTE — Progress Notes (Signed)
PATIENT: Rose [name] DOB: 10-20-1975  HISTORICAL  Rose [name] is a 38 yo RH African American female, she is referred by her primary care physician, gynecologist Dr. Julien Girt for evaluation of right leg pain, right neck pain   She had past medical history of diabetes, chronic neck pain since 2005 .  She suffered chronic pelvic area pain, eventually underwent laparoscopic surgery in August 24 2013, per note, she was given general anesthesia, intubated, placed in a dorsal lithotomy position.   Surgery to help her pelvic pain very much, but postsurgically, she began to experience deep achy pain involving right anterior thigh muscle, anterior leg, radiating below her right knee, to right anterior shin, she denied persistent sensory loss, she denies left lower extremity involvement, no significant low back pain, no bowel bladder incontinence.  In addition, she experienced much worsening pain of her neck, limited range of motion of her neck, radiating pain from right neck, to the right shoulder, right arm, she also complains of a few months history of shooting sensation along her spine, and her body, itching, she was given a prescription of Neurontin by her psychologist, complains of itching, did not agree with her,  She denies significant gait difficulty other than the pain, no bowel bladder incontinence.  REVIEW OF SYSTEMS: Full 14 system review of systems performed and notable only for insomnia, anxiety  ALLERGIES: Allergies  Allergen Reactions  . Bactrim [Sulfamethoxazole-Tmp Ds] Swelling  . Other Swelling    Unknown antibiotic    HOME MEDICATIONS: Current Outpatient Prescriptions on File Prior to Visit  Medication Sig Dispense Refill  . amphetamine-dextroamphetamine (ADDERALL) 20 MG tablet Take 20 mg by mouth 2 (two) times daily.       . bromocriptine (PARLODEL) 2.5 MG tablet Take 0.5 tablets (1.25 mg total) by mouth at bedtime.  15 tablet  11  . clonazePAM (KLONOPIN) 1  MG tablet Take 1 mg by mouth 2 (two) times daily.      . Dapagliflozin Propanediol (FARXIGA) 10 MG TABS Take 1 tablet by mouth daily.      . diazepam (VALIUM) 5 MG tablet Take 5 mg by mouth every 6 (six) hours as needed for anxiety.      Marland Kitchen glimepiride (AMARYL) 4 MG tablet Take 1 tablet (4 mg total) by mouth daily before breakfast.  30 tablet  3  . glucose blood (ONE TOUCH ULTRA TEST) test strip 1 each by Other route daily. And lancets 1/day 250.00  100 each  12  . sitaGLIPtin (JANUVIA) 100 MG tablet Take 100 mg by mouth daily.       No current facility-administered medications on file prior to visit.    PAST MEDICAL HISTORY: Past Medical History  Diagnosis Date  . Diabetes mellitus without complication Since 1610.  . Asthma   . Anxiety   . Neck pain   . Right leg pain     PAST SURGICAL HISTORY: Past Surgical History  Procedure Laterality Date  . Wrist, cyst removal    . Cesarean section    . Cholecystectomy    . Laparoscopy N/A 08/24/2013    Procedure: LAPAROSCOPY OPERATIVE with lysis of adhesions ;  Surgeon: Marylynn Pearson, MD;  Location: [facility];  Service: Gynecology;  Laterality: N/A;    FAMILY HISTORY: Family History  Problem Relation Age of Onset  . Diabetes Neg Hx   . Blindness Father   . High blood pressure Father   . Aneurysm Mother     SOCIAL HISTORY:  History   Social History  . Marital Status: Single    Spouse Name: N/A    Number of Children: 1  . Years of Education: GED   Occupational History  .  [employer],     Social History Main Topics  . Smoking status: Never Smoker   . Smokeless tobacco: Never Used  . Alcohol Use: No  . Drug Use: No  . Sexual Activity: Not on file   Other Topics Concern  . Not on file   Social History Narrative   Patient is single and she takes care of her daughter. Patient works full time Web designer.   Education GED.   Right handed.   Caffeine one cup of coffee daily and soda daily.   Children One          PHYSICAL EXAM   Filed Vitals:   09/19/13 1514  BP: 114/83  Pulse: 117  Height: 5\' 4"  (1.626 m)  Weight: 155 lb (70.308 kg)    Not recorded    Body mass index is 26.59 kg/(m^2).   Generalized: In no acute distress  Neck: Supple, no carotid bruits   Cardiac: Regular rate rhythm  Pulmonary: Clear to auscultation bilaterally  Musculoskeletal: No deformity, limited range of motion of neck turning, flexion-extension.  Neurological examination  Mentation: Alert oriented to time, place, history taking, and causual conversation  Cranial nerve II-XII: Pupils were equal round reactive to light. Extraocular movements were full.  Visual field were full on confrontational test. Bilateral fundi were sharp.  Facial sensation and strength were normal. Hearing was intact to finger rubbing bilaterally. Uvula tongue midline.  Head turning and shoulder shrug and were normal and symmetric.Tongue protrusion into cheek strength was normal.  Motor: Normal tone, bulk and strength.  Sensory: Intact to fine touch, pinprick, preserved vibratory sensation, and proprioception at toes.  Coordination: Normal finger to nose, heel-to-shin bilaterally there was no truncal ataxia  Gait: Rising up from seated position without assistance, normal stance, without trunk ataxia, moderate stride, good arm swing, smooth turning, able to perform tiptoe, and heel walking without difficulty.   Romberg signs: Negative  Deep tendon reflexes: Brachioradialis 2/2, biceps 2/2, triceps 2/2, patellar 2/2, Achilles 2/2, plantar responses were flexor bilaterally.   DIAGNOSTIC DATA (LABS, IMAGING, TESTING) - I reviewed patient records, labs, notes, testing and imaging myself where available.  Lab Results  Component Value Date   WBC 7.4 08/18/2013   HGB 12.4 08/18/2013   HCT 37.6 08/18/2013   MCV 75.2* 08/18/2013   PLT 268 08/18/2013      Component Value Date/Time   NA 139 08/18/2013 1531   K 3.8 08/18/2013 1531    CL 102 08/18/2013 1531   CO2 25 08/18/2013 1531   GLUCOSE 111* 08/18/2013 1531   BUN 14 08/18/2013 1531   CREATININE 0.81 08/18/2013 1531   CALCIUM 9.2 08/18/2013 1531   PROT 7.4 01/13/2008 2035   ALBUMIN 4.3 01/13/2008 2035   AST 15 01/13/2008 2035   ALT 11 01/13/2008 2035   ALKPHOS 44 01/13/2008 2035   BILITOT 0.7 01/13/2008 2035   GFRNONAA >90 08/18/2013 1531   GFRAA >90 08/18/2013 1531   Lab Results  Component Value Date   CHOL 160 01/13/2008   HDL 51 01/13/2008   LDLCALC 98 01/13/2008   TRIG 56 01/13/2008   CHOLHDL 3.1 Ratio 01/13/2008   Lab Results  Component Value Date   HGBA1C 9.1* 09/18/2013   No results found for this basename: [id number]  Lab Results  Component Value Date   TSH 0.635 01/13/2008      ASSESSMENT AND PLAN  Rose [name] is a 38 y.o. female complains of chronic neck pain, radiating pain to her right shoulder, and arm, bilateral anterior thigh area deep achy pain status post laparoscopic surgery.  1. Differentiation of her right neck pain including right cervical radiculopathy 2. Differentiation diagnosis of Right anterior thigh pain including right lateral femoral cutaneous neuropathy, vs. right femoral neuropathy. 3. Will complete evaluation with MRI of cervical, EMG nerve conduction study 4. Lyrica 50 mg 3 times a day   Marcial Pacas, M.D. Ph.D.  [facility] [address] [place], [place] [address] [phone]

## 2013-09-21 ENCOUNTER — Ambulatory Visit (INDEPENDENT_AMBULATORY_CARE_PROVIDER_SITE_OTHER): Payer: 59 | Admitting: Neurology

## 2013-09-21 ENCOUNTER — Encounter (INDEPENDENT_AMBULATORY_CARE_PROVIDER_SITE_OTHER): Payer: Self-pay | Admitting: Radiology

## 2013-09-21 DIAGNOSIS — M79604 Pain in right leg: Secondary | ICD-10-CM

## 2013-09-21 DIAGNOSIS — M79609 Pain in unspecified limb: Secondary | ICD-10-CM

## 2013-09-21 DIAGNOSIS — Z0289 Encounter for other administrative examinations: Secondary | ICD-10-CM

## 2013-09-21 DIAGNOSIS — M542 Cervicalgia: Secondary | ICD-10-CM

## 2013-09-21 MED ORDER — DULOXETINE HCL 60 MG PO CPEP: 60.0000 mg | ORAL_CAPSULE | Freq: Every day | ORAL | Status: DC

## 2013-09-21 NOTE — Procedures (Signed)
   NCS (NERVE CONDUCTION STUDY) WITH EMG (ELECTROMYOGRAPHY) REPORT   STUDY DATE: September 21 2013 PATIENT NAME: Rose [name] DOB: 27-Sep-1975 MRN: [id number]    TECHNOLOGIST: Towana Badger ELECTROMYOGRAPHER: Marcial Pacas M.D.  CLINICAL INFORMATION:  38 years old female, status post laparoscopic surgery in August 24 2013, she has developed right lower extremity deep achy pain. She has a history of chronic neck pain, now with worsening right-sided neck pain, radiating to her right shoulder,  FINDINGS: NERVE CONDUCTION STUDY: Right median, ulnar mixed, sensory and motor responses were normal. Right radial sensory response was normal.  Right sural sensory response was normal.  Right tibial, peroneal motor responses were normal. Right femoral motor responses were normal.  Bilateral saphenous sensory response was attempted without success,  NEEDLE ELECTROMYOGRAPHY: Selected needle examination was performed at right upper, lower extremity muscles, left lower extremity muscles, right cervical, lumbar paraspinal muscles  Needle examination of right deltoid, biceps, triceps, extensor digital communis, pronator teres, first dorsal interossei was normal. There was no spontaneous activity at right cervical paraspinal muscles, right C5, C6, C7.  Right tibialis anterior: Increased insertion activity, 2-3 plus spontaneous activity, complex enlarged motor unit potential, with decreased recruitment patterns. Right peroneal longus: Normally insertion activity, no spontaneous activity, complex enlarged motor unit potential, with mildly decreased recruitment patterns. Right tibialis posterior, medial gastrocnemius,: Normally insertion activity, no spontaneous activity, normal morphology motor unit potential, with mildly decreased recruitment patterns. Right vastus lateralis, rectus femoris, abductor magnus: Normally insertion activity, no spontaneous activity, normal morphology motor unit potential, with  normal recruitment patterns.  Right biceps femoris short head, biceps femoris long head, normally insertion activity, no spontaneous activity, normal morphology mildly enlarged motor unit potential, with mildly decreased recruitment patterns.  Needle examination of gluteus medius was normal.   There was no spontaneous activity at right lumbosacral paraspinal muscles, right L4-5 S1.  Needle examination of selected left lower extremity muscle for comparison, left tibialis anterior, tibialis posterior was normal.   IMPRESSION:  This is an abnormal study. There is electrodiagnostic evidence of active neuropathic changes involving right L4, L5 myotomes, suggestive of right lumbosacral radiculopathy, could not rule out the possibility of right lower lumbar plexopathy. There was no evidence of right cervical radiculopathy.  INTERPRETING PHYSICIAN:   Marcial Pacas M.D. Ph.D. [facility] [address] [place], [place] [address] [phone]

## 2013-09-25 ENCOUNTER — Telehealth: Payer: Self-pay | Admitting: Neurology

## 2013-09-25 NOTE — Telephone Encounter (Signed)
Spoke to patient and she relayed her blood sugars are in the 300's, also having blurry vision.  Has been taking Cymbalta since last week.  Please advise.  [phone]

## 2013-09-25 NOTE — Telephone Encounter (Signed)
Patient calling about her generic Cymbalta script, states that it is spiking her blood sugar and also causing blurry vision, please return call and advise.

## 2013-09-26 NOTE — Telephone Encounter (Signed)
I have called [place], fail to reach her, could not leave a message either, her elevated glucose less likely due to cymbalta.  Butch Penny, try again, she should contact her primary care for better control of DM.

## 2013-09-26 NOTE — Telephone Encounter (Signed)
Left message but not sure it went through.  I relayed that the elevated glucose is less likely due to Cymbalta, per Dr. Krista Blue.  And patient should contact PCP for better control of DM.

## 2013-10-04 ENCOUNTER — Ambulatory Visit (INDEPENDENT_AMBULATORY_CARE_PROVIDER_SITE_OTHER): Payer: 59

## 2013-10-04 ENCOUNTER — Encounter (INDEPENDENT_AMBULATORY_CARE_PROVIDER_SITE_OTHER): Payer: Self-pay

## 2013-10-04 DIAGNOSIS — M542 Cervicalgia: Secondary | ICD-10-CM

## 2013-10-04 DIAGNOSIS — M79609 Pain in unspecified limb: Secondary | ICD-10-CM

## 2013-10-04 DIAGNOSIS — M79604 Pain in right leg: Secondary | ICD-10-CM

## 2013-10-05 ENCOUNTER — Encounter (INDEPENDENT_AMBULATORY_CARE_PROVIDER_SITE_OTHER): Payer: Self-pay

## 2013-10-05 ENCOUNTER — Encounter: Payer: Self-pay | Admitting: Neurology

## 2013-10-05 ENCOUNTER — Ambulatory Visit (INDEPENDENT_AMBULATORY_CARE_PROVIDER_SITE_OTHER): Payer: 59 | Admitting: Neurology

## 2013-10-05 VITALS — BP 116/84 | HR 106 | Ht 64.0 in | Wt 152.0 lb

## 2013-10-05 DIAGNOSIS — M542 Cervicalgia: Secondary | ICD-10-CM

## 2013-10-05 DIAGNOSIS — M79604 Pain in right leg: Secondary | ICD-10-CM

## 2013-10-05 DIAGNOSIS — E119 Type 2 diabetes mellitus without complications: Secondary | ICD-10-CM

## 2013-10-05 MED ORDER — NORTRIPTYLINE HCL 10 MG PO CAPS: ORAL_CAPSULE | ORAL | Status: DC

## 2013-10-05 MED ORDER — TIZANIDINE HCL 2 MG PO TABS: 2.0000 mg | ORAL_TABLET | Freq: Four times a day (QID) | ORAL | Status: DC | PRN

## 2013-10-05 NOTE — Progress Notes (Signed)
PATIENT: Rose [name] DOB: Jun 03, 1975  HISTORICAL  Rose Dean Zweig is a 38 yo RH African American female, she is referred by her primary care physician, gynecologist Dr. Julien Girt for evaluation of right leg pain, right neck pain   She had past medical history of diabetes since 2014, chronic neck pain since 2005 .  She suffered chronic pelvic area pain, eventually underwent laparoscopic surgery in August 24 2013, per note, she was given general anesthesia, intubated, placed in a dorsal lithotomy position.   Surgery to help her pelvic pain very much, but postsurgically, she began to experience deep achy pain involving right anterior thigh muscle, anterior leg, radiating below her right knee, to right anterior shin, she denied persistent sensory loss, she denies left lower extremity involvement, no significant low back pain, no bowel bladder incontinence.  In addition, she experienced much worsening pain of her neck, limited range of motion of her neck, radiating pain from right neck, to the right shoulder, right arm, she also complains of a few months history of shooting sensation along her spine, and her body, itching, she was given a prescription of Neurontin by her psychologist, complains of itching, did not agree with her,  She denies significant gait difficulty other than the pain, no bowel bladder incontinence.  UPDATE Oct 05 2013: Her right side neck pain has much improved, she continues to have right anterior thigh deep achy pain, radiating to no right knee, to her right medial ankle, she denies significant weakness, she denies bowel and bladder incontinence.  I have reviewed MRI of cervical, lumbar spine, multilevel degenerative disc disease, there is no significant canal, or foraminal stenosis  REVIEW OF SYSTEMS: Full 14 system review of systems performed and notable only for insomnia, anxiety, right anterior thigh pain.  ALLERGIES: Allergies  Allergen Reactions  .  Bactrim [Sulfamethoxazole-Tmp Ds] Swelling  . Neurontin [Gabapentin] Itching  . Other Swelling    Unknown antibiotic    HOME MEDICATIONS: Current Outpatient Prescriptions on File Prior to Visit  Medication Sig Dispense Refill  . amphetamine-dextroamphetamine (ADDERALL) 20 MG tablet Take 20 mg by mouth 2 (two) times daily.       . clonazePAM (KLONOPIN) 1 MG tablet Take 1 mg by mouth 2 (two) times daily.      . diazepam (VALIUM) 5 MG tablet Take 5 mg by mouth every 6 (six) hours as needed for anxiety.      . DULoxetine (CYMBALTA) 60 MG capsule Take 1 capsule (60 mg total) by mouth daily.  30 capsule  11  . glimepiride (AMARYL) 4 MG tablet Take 1 tablet (4 mg total) by mouth daily before breakfast.  30 tablet  3  . glucose blood (ONE TOUCH ULTRA TEST) test strip 1 each by Other route daily. And lancets 1/day 250.00  100 each  12  . sitaGLIPtin (JANUVIA) 100 MG tablet Take 100 mg by mouth daily.       No current facility-administered medications on file prior to visit.    PAST MEDICAL HISTORY: Past Medical History  Diagnosis Date  . Diabetes mellitus without complication Since 3710.  . Asthma   . Anxiety   . Neck pain   . Right leg pain     PAST SURGICAL HISTORY: Past Surgical History  Procedure Laterality Date  . Wrist, cyst removal    . Cesarean section    . Cholecystectomy    . Laparoscopy N/A 08/24/2013    Procedure: LAPAROSCOPY OPERATIVE with lysis of adhesions ;  Surgeon: Marylynn Pearson, MD;  Location: [facility];  Service: Gynecology;  Laterality: N/A;    FAMILY HISTORY: Family History  Problem Relation Age of Onset  . Diabetes Neg Hx   . Blindness Father   . High blood pressure Father   . Aneurysm Mother     SOCIAL HISTORY:  History   Social History  . Marital Status: Single    Spouse Name: N/A    Number of Children: 1  . Years of Education: GED   Occupational History  .  [employer],     Social History Main Topics  . Smoking status: Never Smoker   .  Smokeless tobacco: Never Used  . Alcohol Use: No  . Drug Use: No  . Sexual Activity: Not on file   Other Topics Concern  . Not on file   Social History Narrative   Patient is single and she takes care of her daughter. Patient works full time Web designer.   Education GED.   Right handed.   Caffeine one cup of coffee daily and soda daily.   Children One         PHYSICAL EXAM   Filed Vitals:   10/05/13 1530  BP: 116/84  Pulse: 106  Height: 5\' 4"  (1.626 m)  Weight: 152 lb (68.947 kg)    Not recorded    Body mass index is 26.08 kg/(m^2).   Generalized: In no acute distress  Neck: Supple, no carotid bruits   Cardiac: Regular rate rhythm  Pulmonary: Clear to auscultation bilaterally  Musculoskeletal: No deformity, limited range of motion of neck turning, flexion-extension.  Neurological examination  Mentation: Alert oriented to time, place, history taking, and causual conversation  Cranial nerve II-XII: Pupils were equal round reactive to light. Extraocular movements were full.  Visual field were full on confrontational test. Bilateral fundi were sharp.  Facial sensation and strength were normal. Hearing was intact to finger rubbing bilaterally. Uvula tongue midline.  Head turning and shoulder shrug and were normal and symmetric.Tongue protrusion into cheek strength was normal.  Motor: Normal tone, bulk and strength.  Sensory: Intact to fine touch, pinprick, preserved vibratory sensation, and proprioception at toes.  Coordination: Normal finger to nose, heel-to-shin bilaterally there was no truncal ataxia  Gait: Rising up from seated position without assistance, normal stance, without trunk ataxia, moderate stride, good arm swing, smooth turning, able to perform tiptoe, and heel walking without difficulty.   Romberg signs: Negative  Deep tendon reflexes: Brachioradialis 2/2, biceps 2/2, triceps 2/2, patellar 2/2, Achilles 2/2, plantar responses were flexor  bilaterally.   DIAGNOSTIC DATA (LABS, IMAGING, TESTING) - I reviewed patient records, labs, notes, testing and imaging myself where available.  Lab Results  Component Value Date   WBC 7.4 08/18/2013   HGB 12.4 08/18/2013   HCT 37.6 08/18/2013   MCV 75.2* 08/18/2013   PLT 268 08/18/2013      Component Value Date/Time   NA 139 08/18/2013 1531   K 3.8 08/18/2013 1531   CL 102 08/18/2013 1531   CO2 25 08/18/2013 1531   GLUCOSE 111* 08/18/2013 1531   BUN 14 08/18/2013 1531   CREATININE 0.81 08/18/2013 1531   CALCIUM 9.2 08/18/2013 1531   PROT 7.4 01/13/2008 2035   ALBUMIN 4.3 01/13/2008 2035   AST 15 01/13/2008 2035   ALT 11 01/13/2008 2035   ALKPHOS 44 01/13/2008 2035   BILITOT 0.7 01/13/2008 2035   GFRNONAA >90 08/18/2013 1531   GFRAA >90 08/18/2013 1531  Lab Results  Component Value Date   CHOL 160 01/13/2008   HDL 51 01/13/2008   LDLCALC 98 01/13/2008   TRIG 56 01/13/2008   CHOLHDL 3.1 Ratio 01/13/2008   Lab Results  Component Value Date   HGBA1C 9.1* 09/18/2013   No results found for this basename: [id number]   Lab Results  Component Value Date   TSH 0.635 01/13/2008      ASSESSMENT AND PLAN  Rose Dean Vaden is a 38 y.o. female complains of chronic neck pain, radiating pain to her right shoulder, and arm, bilateral anterior thigh area deep achy pain status post laparoscopic surgery. Her right side neck pain has much improved, but her right anterior achy pain remains, also radiating below her right knee to her right medial ankle, she denies significant weakness  MRI of cervical, lumbar spine showed mild degenerative disc disease, there was no evidence of foraminal, or canal stenosis.  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 There was evidence of active denervation at the right tibialis anterior,  2. She could not tolerate multiple neuropathic pain medications, this including Neurontin, Lyrica, Cymbalta, Flexeril  3. will  try low dose nortriptyline, titrating to 20 mg every night, tizanidine as needed, refer her to physical therapy     Marcial Pacas, M.D. Ph.D.  [facility] [address] [place], [place] [address] [phone]

## 2013-10-10 ENCOUNTER — Telehealth: Payer: Self-pay | Admitting: Neurology

## 2013-10-10 NOTE — Telephone Encounter (Signed)
I have called and fail to reach patient, could not leave message for her MRI report, will go over at follow up visit

## 2013-10-24 ENCOUNTER — Other Ambulatory Visit: Payer: Self-pay | Admitting: Endocrinology

## 2013-10-24 ENCOUNTER — Telehealth: Payer: Self-pay

## 2013-10-24 MED ORDER — BROMOCRIPTINE MESYLATE 2.5 MG PO TABS: 1.2500 mg | ORAL_TABLET | Freq: Every day | ORAL | Status: DC

## 2013-10-24 NOTE — Telephone Encounter (Signed)
i refilled 

## 2013-10-24 NOTE — Telephone Encounter (Signed)
Received a refill request for Bromocriptine 2.5 mg for a 90 day supply. Medication is not on current list. Please advise if ok to refill,  Thanks!

## 2013-11-06 ENCOUNTER — Ambulatory Visit: Payer: 59 | Admitting: Neurology

## 2013-11-15 ENCOUNTER — Encounter: Payer: Self-pay | Admitting: Neurology

## 2013-11-24 ENCOUNTER — Telehealth: Payer: Self-pay | Admitting: Endocrinology

## 2013-11-24 MED ORDER — BROMOCRIPTINE MESYLATE 2.5 MG PO TABS: 3.0000 mg | ORAL_TABLET | Freq: Every day | ORAL | Status: DC

## 2013-11-24 NOTE — Telephone Encounter (Signed)
Patient stated she need a refill of Bromocriptine 2.5 mg 90 supply,

## 2013-11-24 NOTE — Telephone Encounter (Signed)
Rx sent to pharmacy per pt's request.  

## 2013-11-27 ENCOUNTER — Ambulatory Visit: Payer: 59 | Attending: Neurology

## 2013-11-27 DIAGNOSIS — M25561 Pain in right knee: Secondary | ICD-10-CM

## 2013-11-27 DIAGNOSIS — M25551 Pain in right hip: Secondary | ICD-10-CM | POA: Diagnosis not present

## 2013-11-27 NOTE — Patient Instructions (Signed)
Iliotibial Band Stretch   Stand with right hip ____ inches from wall. Cross other leg in front and use it and arm for support. Lean toward wall until a stretch is felt on outside of hip near wall. Keep that leg straight. Hold ____ seconds. Repeat ____ times. Do ____ sessions per day.  Iliotibial Band Stretch, Side-Lying   Lie on side, back to edge of bed, top arm in front. Allow top leg to drape behind over edge. Hold ___ seconds.  Repeat ___ times per session. Do ___ sessions per day.  Iliotibial Band (Supine)   Lying on back, bend right leg and place foot outside other knee. Slowly and gently use foot to move straight leg inward. Repeat ____ times. Do ____ sessions per day.  Iliotibial Band (Sit)   With back supported, bend left knee over other leg held straight. Use opposite elbow to push knee further over while turning body away. Do not let bottom slide away from support. Hold ____ seconds. Repeat ____ times. Do ____ sessions per day. CAUTION: Stretch should be gentle, steady and slow.  Iliotibial Band Stretch, Standing   Stand, one leg crossed behind other leg. Bending at waist, reach toward back foot. Hold ___ seconds.  Repeat ___ times per session. Do ___ sessions per day.  Iliotibial Band Stretch, Side-Lying   Lie on side facing edge of bed, top arm behind. Allow top leg to drape over edge. Hold ___ seconds.  Repeat ___ times per session. Do ___ sessions per day.  Copyright  VHI. All rights reserved.

## 2013-11-27 NOTE — Therapy (Addendum)
Physical Therapy Evaluation  Patient Details  Name: Rose [name] MRN: [id number] Date of Birth: [birth year]  Encounter Date: 11/27/2013      PT End of Session - 11/27/13 0844    Visit Number 1   Number of Visits 8   Date for PT Re-Evaluation 12/25/13   PT Start Time 0800   PT Stop Time 0845   PT Time Calculation (min) 45 min   Activity Tolerance Patient tolerated treatment well      Past Medical History  Diagnosis Date  . Diabetes mellitus without complication   . Asthma   . Anxiety   . Neck pain   . Right leg pain     Past Surgical History  Procedure Laterality Date  . Wrist, cyst removal    . Cesarean section    . Cholecystectomy    . Laparoscopy N/A 08/24/2013    Procedure: LAPAROSCOPY OPERATIVE with lysis of adhesions ;  Surgeon: Marylynn Pearson, MD;  Location: [facility];  Service: Gynecology;  Laterality: N/A;    There were no vitals taken for this visit.  Visit Diagnosis:  Pain in joint, lower leg, right - Plan: PT plan of care cert/re-cert  Pain in joint, pelvic region and thigh, right - Plan: PT plan of care cert/re-cert      Subjective Assessment - 11/27/13 0804    Symptoms No pain today. When has pain she can have pain in RT hip and thigh and at times into lower RT leg. She cand have whole leg ache. Sometimes in Rt knee only. Rainy weather increases  pain    Pertinent History The leg pain started after  laproscopic surgery in August 2015.  The pain started a couple of days later. She uses heat and medication for comfort. She only take meds occasionally.    Limitations --  Driving, lying down at night   How long can you sit comfortably? 10 minutes and she needs to move more frequent than normal   How long can you stand comfortably? Can stand as needed   How long can you walk comfortably? She can wlak without problem   Diagnostic tests Nerve conduction indicated some nerve problem   Patient Stated Goals To get off medicaiton and wants pain to be  releived   Currently in Pain? No/denies   Multiple Pain Sites No          [facility] PT Assessment - 11/27/13 0811    Assessment   Medical Diagnosis RT leg pain   Onset Date 09/03/13   Next MD Visit Not scheduled   Prior Therapy no   Precautions   Precautions None   Restrictions   Weight Bearing Restrictions No   Balance Screen   Has the patient fallen in the past 6 months No   Has the patient had a decrease in activity level because of a fear of falling?  No   Is the patient reluctant to leave their home because of a fear of falling?  No   Home Environment   Living Enviornment Private residence  apartment   Living Arrangements Children   Posture/Postural Control   Posture Comments RT shoulder higher than LT  , increased lordosis lumbar   AROM   Overall AROM  Within functional limits for tasks performed  Lumbar range without causing any symptoms into RT leg.    Flexibility   Soft Tissue Assessment /Muscle Lenght --  All hp range WNL except decreased hip extension 5-10 degrees   Special Tests  Hip Special Tests  Marcello Moores Test;Ober's Test  negative   Ambulation/Gait   Ambulation/Gait --  Normal   Stairs --  She reports independent            PT Education - 11/27/13 0831    Education provided Yes   Person(s) Educated Patient   Methods Explanation;Demonstration;Handout   Comprehension Verbalized understanding;Returned demonstration              Plan - 11/27/13 0845    Clinical Impression Statement Rose [name] has normal strength and some minor hip extension tighness and poor core strength. Some soreness at RT greater trochantor may indicate some bursitis.    Pt will benefit from skilled therapeutic intervention in order to improve on the following deficits Decreased strength;Decreased range of motion;Postural dysfunction   Rehab Potential Good   PT Frequency 2x / week   PT Duration 4 weeks   PT Treatment/Interventions Cryotherapy;Moist Heat;Patient/family  education;Therapeutic exercise;Manual techniques;Passive range of motion  iontophoressis   PT Next Visit Plan [place] and STW to RT hip, ionto if OK,d, core strength   PT Home Exercise Plan ITB stretch, core strength, hip flexor stretch   Consulted and Agree with Plan of Care Patient        Problem List Patient Active Problem List   Diagnosis Date Noted  . Neck pain 09/19/2013  . Right leg pain   . Diabetes 09/18/2013       By signing I understand that I am ordering/authorizing the use of Iontophoresis using 4 mg/mL of dexamethasone as a component of this plan of care.                                        Darrel Hoover PT 11/27/2013, 9:06 AM      PHYSICAL THERAPY DISCHARGE SUMMARY  Visits from Start of Care:   Current functional level related to goals / functional outcomes: Unknown as he did not return   Remaining deficits: Unknown as he did not return   Education / Equipment: None Plan:                                                    Patient goals were not met. Patient is being discharged due to not returning since the last visit.  ?????   Rose [name]   PT   09/11/14     4:33 PM

## 2013-12-04 ENCOUNTER — Ambulatory Visit: Payer: 59

## 2013-12-11 ENCOUNTER — Ambulatory Visit: Payer: 59 | Attending: Neurology

## 2013-12-11 DIAGNOSIS — M25561 Pain in right knee: Secondary | ICD-10-CM | POA: Insufficient documentation

## 2013-12-11 DIAGNOSIS — M25551 Pain in right hip: Secondary | ICD-10-CM | POA: Insufficient documentation

## 2013-12-18 ENCOUNTER — Ambulatory Visit: Payer: 59

## 2013-12-27 ENCOUNTER — Emergency Department: Payer: Self-pay | Admitting: Emergency Medicine

## 2013-12-28 LAB — URINALYSIS, COMPLETE
BLOOD: NEGATIVE
Bacteria: NONE SEEN
Bilirubin,UR: NEGATIVE
Glucose,UR: NEGATIVE mg/dL (ref 0–75)
LEUKOCYTE ESTERASE: NEGATIVE
NITRITE: NEGATIVE
PH: 5 (ref 4.5–8.0)
RBC,UR: 30 /HPF (ref 0–5)
Specific Gravity: 1.044 (ref 1.003–1.030)

## 2014-01-29 ENCOUNTER — Other Ambulatory Visit: Payer: Self-pay

## 2014-01-29 ENCOUNTER — Telehealth: Payer: Self-pay | Admitting: Endocrinology

## 2014-01-29 MED ORDER — GLIMEPIRIDE 4 MG PO TABS: 4.0000 mg | ORAL_TABLET | Freq: Every day | ORAL | Status: DC

## 2014-01-29 NOTE — Telephone Encounter (Signed)
Pt taking cymbalta and her blood sugar has been rising due to this please advise if pt should increase her insulin dose

## 2014-01-29 NOTE — Telephone Encounter (Signed)
Pt now uses [facility] on [address] in [place] no longer uses the [employer]

## 2014-01-29 NOTE — Telephone Encounter (Signed)
Pt states she has lost her insurance and cannot come in at this time due to her finances.  Please advise, Thanks!

## 2014-01-29 NOTE — Telephone Encounter (Signed)
Pt advised of note below and voiced understanding.  

## 2014-01-29 NOTE — Addendum Note (Signed)
Addended by: Renato Shin on: 01/29/2014 04:11 PM   Modules accepted: Orders

## 2014-01-29 NOTE — Telephone Encounter (Signed)
please call patient: Ov is due.  Let's address then

## 2014-01-29 NOTE — Telephone Encounter (Signed)
If you cannot come in for ov, i can refill the glimepiride, but i can't rx new meds.  Even then, i can only refill the glimepiride once. Please come in for ov then.

## 2014-01-29 NOTE — Telephone Encounter (Signed)
See below. I contacted pt. She states for the last 2 weeks in the mornings her blood sugar has been consistently over 240 and she has been experiencing blurred vision Pt is currently taking [place] and Januvia. Pt is not taking Bromocriptine or Glimepiride. Pt states her current medications are not working well for her and wanted to know what adjustments could be made. Please advise, Thanks!

## 2014-02-18 ENCOUNTER — Other Ambulatory Visit: Payer: Self-pay | Admitting: Endocrinology

## 2014-02-20 ENCOUNTER — Encounter: Payer: Self-pay | Admitting: Neurology

## 2014-02-20 NOTE — Therapy (Signed)
[facility] [place], [place], [address] Phone: [phone]   Fax:  [phone]  Patient Details  Name: Rose [name] MRN: [id number] Date of Birth: Dec 10, 1975 Referring Provider:  No ref. provider found  Encounter Date: 02/20/2014 PHYSICAL THERAPY DISCHARGE SUMMARY  Visits from Start of Care:1  Current functional level related to goals / functional outcomes: Unknown as she noshowed  Appointments and did not return   Remaining deficits: Unknown   Education / Equipment: Stretching  Plan:                                                    Patient goals were not met. Patient is being discharged due to not returning since the last visit.  ?????      Darrel Hoover PT 02/20/2014, 12:26 PM  [facility] [address] [place], [place], [address] Phone: [phone]   Fax:  [phone]

## 2014-03-01 ENCOUNTER — Emergency Department: Payer: Self-pay | Admitting: Emergency Medicine

## 2015-06-07 ENCOUNTER — Emergency Department
Admission: EM | Admit: 2015-06-07 | Discharge: 2015-06-07 | Disposition: A | Discharge status: Home / Self Care | Payer: Self-pay | Attending: Student | Admitting: Student

## 2015-06-07 ENCOUNTER — Encounter: Payer: Self-pay | Admitting: Emergency Medicine

## 2015-06-07 DIAGNOSIS — J45909 Unspecified asthma, uncomplicated: Secondary | ICD-10-CM | POA: Insufficient documentation

## 2015-06-07 DIAGNOSIS — Z794 Long term (current) use of insulin: Secondary | ICD-10-CM | POA: Insufficient documentation

## 2015-06-07 DIAGNOSIS — E119 Type 2 diabetes mellitus without complications: Secondary | ICD-10-CM | POA: Insufficient documentation

## 2015-06-07 DIAGNOSIS — N764 Abscess of vulva: Secondary | ICD-10-CM | POA: Insufficient documentation

## 2015-06-07 MED ORDER — DOXYCYCLINE HYCLATE 100 MG PO TABS: 100.0000 mg | ORAL_TABLET | Freq: Two times a day (BID) | ORAL | Status: DC

## 2015-06-07 MED ORDER — DOXYCYCLINE HYCLATE 100 MG PO TABS
100.0000 mg | ORAL_TABLET | Freq: Once | ORAL | Status: AC
  Administered 2015-06-07: 100 mg via ORAL
  Filled 2015-06-07: qty 1

## 2015-06-07 NOTE — ED Provider Notes (Signed)
[facility] Emergency Department Provider Note ____________________________________________  Time seen: 1211  I have reviewed the triage vital signs and the nursing notes.  HISTORY  Chief Complaint  Abscess   HPI Rose [name] is a 40 y.o. female present to the ED for evaluation of an abscess to the right inner thigh is been present for the last week. She started over the last 2 days however she's had some increase in spontaneous, clear/bloody drainage from the area. She does admit to shaving the bikini area recently. She denies any fevers, chills, or sweats. She has had previous skin infections treated with doxycycline successfully.  Past Medical History  Diagnosis Date  . Diabetes mellitus without complication ([place])   . Asthma   . Anxiety   . Neck pain   . Right leg pain     Patient Active Problem List   Diagnosis Date Noted  . Neck pain 09/19/2013  . Right leg pain   . Diabetes ([place]) 09/18/2013    Past Surgical History  Procedure Laterality Date  . Wrist, cyst removal    . Cesarean section    . Cholecystectomy    . Laparoscopy N/A 08/24/2013    Procedure: LAPAROSCOPY OPERATIVE with lysis of adhesions ;  Surgeon: Marylynn Pearson, MD;  Location: [facility];  Service: Gynecology;  Laterality: N/A;    Current Outpatient Rx  Name  Route  Sig  Dispense  Refill  . amphetamine-dextroamphetamine (ADDERALL) 20 MG tablet   Oral   Take 20 mg by mouth 2 (two) times daily.          . bromocriptine (PARLODEL) 2.5 MG tablet      TAKE 1 TABLET (2.5 MG TOTAL) BY MOUTH AT BEDTIME.   15 tablet   5   . clonazePAM (KLONOPIN) 1 MG tablet   Oral   Take 1 mg by mouth 2 (two) times daily.         Marland Kitchen doxycycline (VIBRA-TABS) 100 MG tablet   Oral   Take 1 tablet (100 mg total) by mouth 2 (two) times daily.   20 tablet   0   . glimepiride (AMARYL) 4 MG tablet   Oral   Take 1 tablet (4 mg total) by mouth daily before breakfast.   30 tablet   3   .  glucose blood (ONE TOUCH ULTRA TEST) test strip   Other   1 each by Other route daily. And lancets 1/day 250.00   100 each   12   . nortriptyline (PAMELOR) 10 MG capsule      One po qhs xone week, then 2 tabs po qhs   60 capsule   11   . sitaGLIPtin (JANUVIA) 100 MG tablet   Oral   Take 100 mg by mouth daily.         Marland Kitchen tiZANidine (ZANAFLEX) 2 MG tablet   Oral   Take 1 tablet (2 mg total) by mouth every 6 (six) hours as needed for muscle spasms.   90 tablet   11     Allergies Bactrim; Neurontin; and Other  Family History  Problem Relation Age of Onset  . Diabetes Neg Hx   . Blindness Father   . High blood pressure Father   . Aneurysm Mother     Social History Social History  Substance Use Topics  . Smoking status: Never Smoker   . Smokeless tobacco: Never Used  . Alcohol Use: No    Review of Systems  Constitutional: Negative  for fever. Genitourinary: Negative for dysuria. Musculoskeletal: Negative for back pain. Skin: Negative for rash. Vulvar/inguinal abscess as above Neurological: Negative for headaches, focal weakness or numbness. ____________________________________________  PHYSICAL EXAM:  VITAL SIGNS: ED Triage Vitals  Enc Vitals Group     BP 06/07/15 1137 113/83 mmHg     Pulse Rate 06/07/15 1137 111     Resp 06/07/15 1137 16     Temp 06/07/15 1137 98.5 F (36.9 C)     Temp Source 06/07/15 1137 Oral     SpO2 06/07/15 1137 100 %     Weight 06/07/15 1137 153 lb (69.4 kg)     Height 06/07/15 1137 5\' 4"  (1.626 m)     Head Cir --      Peak Flow --      Pain Score 06/07/15 1136 6     Pain Loc --      Pain Edu? --      Excl. in [place]? --    Constitutional: Alert and oriented. Well appearing and in no distress. Head: Normocephalic and atraumatic. Respiratory: Normal respiratory effort.  GU: normal external genitalia. Local skin induration with spontaneous drainage at the right bikini line, consistent with a  Local skin abscess x 2.   Musculoskeletal: Nontender with normal range of motion in all extremities.  Neurologic:  Normal gait without ataxia. Normal speech and language. No gross focal neurologic deficits are appreciated. Skin:  Skin is warm, dry and intact. No rash noted. ____________________________________________  PROCEDURES  Doxycycline 100 mg PO ____________________________________________  INITIAL IMPRESSION / ASSESSMENT AND PLAN / ED COURSE  Patient with a small, local abscess to the right bikini line. No indication for I&D at this presentation. She will be discharged with a prescription for Doxycycline to dose as directed. Shaving hygiene instructions provided. Follow-up with Dr. Ancil Boozer as planned in 1 week.  ____________________________________________  FINAL CLINICAL IMPRESSION(S) / ED DIAGNOSES  Final diagnoses:  Vulvar abscess     Melvenia Needles, PA-C 06/07/15 1232  Joanne Gavel, MD 06/07/15 1657

## 2015-06-07 NOTE — Discharge Instructions (Signed)
Abscess An abscess is an infected area that contains a collection of pus and debris.It can occur in almost any part of the body. An abscess is also known as a furuncle or boil. CAUSES  An abscess occurs when tissue gets infected. This can occur from blockage of oil or sweat glands, infection of hair follicles, or a minor injury to the skin. As the body tries to fight the infection, pus collects in the area and creates pressure under the skin. This pressure causes pain. People with weakened immune systems have difficulty fighting infections and get certain abscesses more often.  SYMPTOMS Usually an abscess develops on the skin and becomes a painful mass that is red, warm, and tender. If the abscess forms under the skin, you may feel a moveable soft area under the skin. Some abscesses break open (rupture) on their own, but most will continue to get worse without care. The infection can spread deeper into the body and eventually into the bloodstream, causing you to feel ill.  DIAGNOSIS  Your caregiver will take your medical history and perform a physical exam. A sample of fluid may also be taken from the abscess to determine what is causing your infection. TREATMENT  Your caregiver may prescribe antibiotic medicines to fight the infection. However, taking antibiotics alone usually does not cure an abscess. Your caregiver may need to make a small cut (incision) in the abscess to drain the pus. In some cases, gauze is packed into the abscess to reduce pain and to continue draining the area. HOME CARE INSTRUCTIONS   Only take over-the-counter or prescription medicines for pain, discomfort, or fever as directed by your caregiver.  If you were prescribed antibiotics, take them as directed. Finish them even if you start to feel better.  If gauze is used, follow your caregiver's directions for changing the gauze.  To avoid spreading the infection:  Keep your draining abscess covered with a  bandage.  Wash your hands well.  Do not share personal care items, towels, or whirlpools with others.  Avoid skin contact with others.  Keep your skin and clothes clean around the abscess.  Keep all follow-up appointments as directed by your caregiver. SEEK MEDICAL CARE IF:   You have increased pain, swelling, redness, fluid drainage, or bleeding.  You have muscle aches, chills, or a general ill feeling.  You have a fever. MAKE SURE YOU:   Understand these instructions.  Will watch your condition.  Will get help right away if you are not doing well or get worse.   This information is not intended to replace advice given to you by your health care provider. Make sure you discuss any questions you have with your health care provider.   Document Released: 10/01/2004 Document Revised: 06/23/2011 Document Reviewed: 03/06/2011 Elsevier Interactive Patient Education 2016 [place] have a local abscess and mild cellulitis to the bikini area, likely due to an ingrown hair. The area is spontaneously draining, which is welcomed. Continue to apply warm compresses to promote drainage. Take the antibiotic as directed, until completely gone. Follow-up with Dr. Ancil Boozer for recheck in 1 week as planned. Return to the ED as needed.

## 2015-06-07 NOTE — ED Notes (Addendum)
Pt states abscess on right inner thigh that started a week ago and is "spreading". Upon assessment the "abscess" is on the right labia. It is bleeding but there is no drainage/discharge.

## 2015-06-07 NOTE — ED Notes (Signed)
Pt verbalized understanding of discharge instructions. NAD at this time. 

## 2016-06-05 DIAGNOSIS — E785 Hyperlipidemia, unspecified: Secondary | ICD-10-CM | POA: Insufficient documentation

## 2016-10-02 ENCOUNTER — Ambulatory Visit (INDEPENDENT_AMBULATORY_CARE_PROVIDER_SITE_OTHER): Payer: BLUE CROSS/BLUE SHIELD | Admitting: Certified Nurse Midwife

## 2016-10-02 ENCOUNTER — Encounter: Payer: Self-pay | Admitting: Certified Nurse Midwife

## 2016-10-02 VITALS — BP 143/93 | HR 94 | Ht 66.0 in | Wt 175.0 lb

## 2016-10-02 DIAGNOSIS — Z3009 Encounter for other general counseling and advice on contraception: Secondary | ICD-10-CM | POA: Diagnosis not present

## 2016-10-02 MED ORDER — IBUPROFEN 800 MG PO TABS: 800.0000 mg | ORAL_TABLET | Freq: Three times a day (TID) | ORAL | 1 refills | Status: DC | PRN

## 2016-10-02 MED ORDER — MISOPROSTOL 200 MCG PO TABS: ORAL_TABLET | ORAL | 2 refills | Status: DC

## 2016-10-02 NOTE — Patient Instructions (Signed)
Intrauterine Device Insertion An intrauterine device (IUD) is a medical device that gets inserted into the uterus to prevent pregnancy. It is a small, T-shaped device that has one or two nylon strings hanging down from it. The strings hang out of the lower part of the uterus (cervix) to allow for future IUD removal. There are two types of IUDs available:  Copper IUD. This type of IUD has copper wire wrapped around it. Copper makes the uterus and fallopian tubes produce a fluid that kills sperm. A copper IUD may last up to 10 years.  Hormone IUD. This type of IUD is made of plastic and contains the hormone progestin (synthetic progesterone). The hormone thickens mucus in the cervix and prevents sperm from entering the uterus. It also thins the uterine lining to prevent implantation of a fertilized egg. The hormone can weaken or kill the sperm that get into the uterus. A hormone IUD may last 3-5 years.  Tell a health care provider about:  Any allergies you have.  All medicines you are taking, including vitamins, herbs, eye drops, creams, and over-the-counter medicines.  Any problems you or family members have had with anesthetic medicines.  Any blood disorders you have.  Any surgeries you have had.  Any medical conditions you have, including any STIs (sexually transmitted infections) you may have.  Whether you are pregnant or may be pregnant. What are the risks? Generally, this is a safe procedure. However, problems may occur, including:  Infection.  Bleeding.  Allergic reactions to medicines.  Accidental puncture (perforation) of the uterus, or damage to other structures or organs.  Accidental placement of the IUD either in the muscle layer of the uterus (myometrium) or outside the uterus.  The IUD falling out of the uterus (expulsion). This is more common among women who have recently had a child.  Pregnancy that happens in the fallopian tube (ectopic pregnancy).  Infection of  the uterus and fallopian tubes (pelvic inflammatory disease).  What happens before the procedure?  Schedule the IUD insertion for when you will have your menstrual period or right after, to make sure you are not pregnant. Placement of the IUD is better tolerated shortly after a menstrual cycle.  Follow instructions from your health care provider about eating or drinking restrictions.  Ask your health care provider about changing or stopping your regular medicines. This is especially important if you are taking diabetes medicines or blood thinners.  You may get a pain reliever to take before the procedure.  You may have tests for: ? Pregnancy. A pregnancy test involves having a urine sample taken. ? STIs. Placing an IUD in someone who has an STI can make the infection worse. ? Cervical cancer. You may have a Pap test to check for this type of cancer. This means collecting cells from your cervix to be examined under a microscope.  You may have a physical exam to determine the size and position of your uterus. The procedure may vary among health care providers and hospitals. What happens during the procedure?  A tool (speculum) will be placed in your vagina and widened so that your health care provider can see your cervix.  Medicine may be applied to your cervix to help lower your risk of infection (antiseptic medicine).  You may be given an anesthetic medicine to numb each side of your cervix (intracervical block or paracervical block). This medicine is usually given by an injection into the cervix.  A tool (uterine sound) will be inserted   into your uterus to determine the length of your uterus and the direction that your uterus may be tilted.  A slim instrument or tube (IUD inserter) that holds the IUD will be inserted into your vagina, through your cervical canal, and into your uterus.  The IUD will be placed in the uterus, and the IUD inserter will be removed.  The strings that are  attached to the IUD will be trimmed so that they lie just below the cervix. The procedure may vary among health care providers and hospitals. What happens after the procedure?  You may have bleeding after the procedure. This is normal. It varies from light bleeding (spotting) for a few days to menstrual-like bleeding.  You may have cramping and pain.  You may feel dizzy or light-headed.  You may have lower back pain. Summary  An intrauterine device (IUD) is a small, T-shaped device that has one or two nylon strings hanging down from it.  Two types of IUDs are available. You may have a copper IUD or a hormone IUD.  Schedule the IUD insertion for when you will have your menstrual period or right after, to make sure you are not pregnant. Placement of the IUD is better tolerated shortly after a menstrual cycle.  You may have bleeding after the procedure. This is normal. It varies from light spotting for a few days to menstrual-like bleeding. This information is not intended to replace advice given to you by your health care provider. Make sure you discuss any questions you have with your health care provider. Document Released: 08/20/2010 Document Revised: 11/13/2015 Document Reviewed: 11/13/2015 Elsevier Interactive Patient Education  2017 [employer]. Levonorgestrel intrauterine device (IUD) What is this medicine? LEVONORGESTREL IUD (LEE voe nor jes trel) is a contraceptive (birth control) device. The device is placed inside the uterus by a healthcare professional. It is used to prevent pregnancy. This device can also be used to treat heavy bleeding that occurs during your period. This medicine may be used for other purposes; ask your health care provider or pharmacist if you have questions. COMMON BRAND NAME(S): Kyleena, LILETTA, Mirena, Skyla What should I tell my health care provider before I take this medicine? They need to know if you have any of these conditions: -abnormal Pap  smear -cancer of the breast, uterus, or cervix -diabetes -endometritis -genital or pelvic infection now or in the past -have more than one sexual partner or your partner has more than one partner -heart disease -history of an ectopic or tubal pregnancy -immune system problems -IUD in place -liver disease or tumor -problems with blood clots or take blood-thinners -seizures -use intravenous drugs -uterus of unusual shape -vaginal bleeding that has not been explained -an unusual or allergic reaction to levonorgestrel, other hormones, silicone, or polyethylene, medicines, foods, dyes, or preservatives -pregnant or trying to get pregnant -breast-feeding How should I use this medicine? This device is placed inside the uterus by a health care professional. Talk to your pediatrician regarding the use of this medicine in children. Special care may be needed. Overdosage: If you think you have taken too much of this medicine contact a poison control center or emergency room at once. NOTE: This medicine is only for you. Do not share this medicine with others. What if I miss a dose? This does not apply. Depending on the brand of device you have inserted, the device will need to be replaced every 3 to 5 years if you wish to continue using this type of   birth control. What may interact with this medicine? Do not take this medicine with any of the following medications: -amprenavir -bosentan -fosamprenavir This medicine may also interact with the following medications: -aprepitant -armodafinil -barbiturate medicines for inducing sleep or treating seizures -bexarotene -boceprevir -griseofulvin -medicines to treat seizures like carbamazepine, ethotoin, felbamate, oxcarbazepine, phenytoin, topiramate -modafinil -pioglitazone -rifabutin -rifampin -rifapentine -some medicines to treat HIV infection like atazanavir, efavirenz, indinavir, lopinavir, nelfinavir, tipranavir, ritonavir -[facility] John's  wort -warfarin This list may not describe all possible interactions. Give your health care provider a list of all the medicines, herbs, non-prescription drugs, or dietary supplements you use. Also tell them if you smoke, drink alcohol, or use illegal drugs. Some items may interact with your medicine. What should I watch for while using this medicine? Visit your doctor or health care professional for regular check ups. See your doctor if you or your partner has sexual contact with others, becomes HIV positive, or gets a sexual transmitted disease. This product does not protect you against HIV infection (AIDS) or other sexually transmitted diseases. You can check the placement of the IUD yourself by reaching up to the top of your vagina with clean fingers to feel the threads. Do not pull on the threads. It is a good habit to check placement after each menstrual period. Call your doctor right away if you feel more of the IUD than just the threads or if you cannot feel the threads at all. The IUD may come out by itself. You may become pregnant if the device comes out. If you notice that the IUD has come out use a backup birth control method like condoms and call your health care provider. Using tampons will not change the position of the IUD and are okay to use during your period. This IUD can be safely scanned with magnetic resonance imaging (MRI) only under specific conditions. Before you have an MRI, tell your healthcare provider that you have an IUD in place, and which type of IUD you have in place. What side effects may I notice from receiving this medicine? Side effects that you should report to your doctor or health care professional as soon as possible: -allergic reactions like skin rash, itching or hives, swelling of the face, lips, or tongue -fever, flu-like symptoms -genital sores -high blood pressure -no menstrual period for 6 weeks during use -pain, swelling, warmth in the leg -pelvic pain  or tenderness -severe or sudden headache -signs of pregnancy -stomach cramping -sudden shortness of breath -trouble with balance, talking, or walking -unusual vaginal bleeding, discharge -yellowing of the eyes or skin Side effects that usually do not require medical attention (report to your doctor or health care professional if they continue or are bothersome): -acne -breast pain -change in sex drive or performance -changes in weight -cramping, dizziness, or faintness while the device is being inserted -headache -irregular menstrual bleeding within first 3 to 6 months of use -nausea This list may not describe all possible side effects. Call your doctor for medical advice about side effects. You may report side effects to FDA at 1-800-FDA-1088. Where should I keep my medicine? This does not apply. NOTE: This sheet is a summary. It may not cover all possible information. If you have questions about this medicine, talk to your doctor, pharmacist, or health care provider.  2018 Elsevier/Gold Standard (2015-10-04 14:14:56)  

## 2016-10-02 NOTE — Progress Notes (Signed)
Pt is here for an IUD consult.

## 2016-10-02 NOTE — Progress Notes (Signed)
GYN ENCOUNTER NOTE  Subjective:       Rose [name] is a 41 y.o. G49P1011 female here for IUD consultation. Patient desires placement of Mirena IUD.   Denies difficulty breathing or respiratory distress, chest pain, abdominal pain, vaginal bleeding, dysuria, and leg pain or swelling.     Gynecologic History  Patient's last menstrual period was 09/09/2016 (approximate).  Contraception: OCP (estrogen/progesterone)  Obstetric History  OB History  Gravida Para Term Preterm AB Living  2 1 1   1 1   SAB TAB Ectopic Multiple Live Births  1       1    # Outcome Date GA Lbr Len/2nd Weight Sex Delivery Anes PTL Lv  2 SAB 2008          1 Term 1993    F CS-Unspec  N LIV      Past Medical History:  Diagnosis Date  . Anxiety   . Diabetes mellitus without complication ([place])   . Neck pain   . Right leg pain     Past Surgical History:  Procedure Laterality Date  . CESAREAN SECTION    . CHOLECYSTECTOMY    . LAPAROSCOPY N/A 08/24/2013   Procedure: LAPAROSCOPY OPERATIVE with lysis of adhesions ;  Surgeon: Marylynn Pearson, MD;  Location: [facility];  Service: Gynecology;  Laterality: N/A;  . wrist, cyst removal      Current Outpatient Prescriptions on File Prior to Visit  Medication Sig Dispense Refill  . glimepiride (AMARYL) 4 MG tablet Take 1 tablet (4 mg total) by mouth daily before breakfast. 30 tablet 3  . glucose blood (ONE TOUCH ULTRA TEST) test strip 1 each by Other route daily. And lancets 1/day 250.00 100 each 12   No current facility-administered medications on file prior to visit.     Allergies  Allergen Reactions  . Sulfamethoxazole-Trimethoprim Swelling  . Bactrim [Sulfamethoxazole-Trimethoprim] Swelling  . Neurontin [Gabapentin] Itching  . Other Swelling    Unknown antibiotic    Social History   Social History  . Marital status: Single    Spouse name: N/A  . Number of children: 1  . Years of education: GED   Occupational History  .  [place] History Main Topics  . Smoking status: Never Smoker  . Smokeless tobacco: Never Used  . Alcohol use No  . Drug use: No  . Sexual activity: Yes    Birth control/ protection: Pill   Other Topics Concern  . Not on file   Social History Narrative   Patient is single and she takes care of her daughter. Patient works full time Web designer.   Education GED.   Right handed.   Caffeine one cup of coffee daily and soda daily.   Children One        Family History  Problem Relation Age of Onset  . Aneurysm Mother   . Blindness Father   . High blood pressure Father   . Diabetes Neg Hx     The following portions of the patient's history were reviewed and updated as appropriate: allergies, current medications, past family history, past medical history, past social history, past surgical history and problem list.  Review of Systems  Review of Systems - Negative except as noted above.  History obtained from the patient.   Objective:   BP (!) 143/93   Pulse 94   Ht 5\' 6"  (1.676 m)   Wt 175 lb (79.4 kg)   LMP 09/09/2016 (Approximate)  BMI 28.25 kg/m   Alert and oriented x 4, no apparent distress.   Physical exam: not indicated.   Assessment:   1. Encounter for counseling regarding contraception  Plan:   Reviewed all forms of birth control options available including abstinence; over the counter/barrier methods; hormonal contraceptive medication including pill, patch, ring, injection,contraceptive implant; hormonal and nonhormonal IUDs; permanent sterilization options including vasectomy and the various tubal sterilization modalities. Risks and benefits reviewed.  Questions were answered.  Information was given to patient to review.   Rx: Motrin and Cytotec, see orders.   RTC x 1-2 weeks for Mirena IUD placement.    Diona Fanti, CNM  A total of 20 minutes were spent face-to-face with the patient during the encounter with greater than 50%  dealing with counseling and coordination of care.

## 2016-10-09 ENCOUNTER — Encounter: Payer: Self-pay | Admitting: Certified Nurse Midwife

## 2016-10-09 ENCOUNTER — Ambulatory Visit (INDEPENDENT_AMBULATORY_CARE_PROVIDER_SITE_OTHER): Payer: BLUE CROSS/BLUE SHIELD | Admitting: Certified Nurse Midwife

## 2016-10-09 VITALS — BP 134/94 | HR 101 | Wt 176.2 lb

## 2016-10-09 DIAGNOSIS — R1084 Generalized abdominal pain: Secondary | ICD-10-CM

## 2016-10-09 DIAGNOSIS — Z30430 Encounter for insertion of intrauterine contraceptive device: Secondary | ICD-10-CM | POA: Diagnosis not present

## 2016-10-09 LAB — POCT URINE PREGNANCY: Preg Test, Ur: NEGATIVE

## 2016-10-09 NOTE — Progress Notes (Signed)
Rose [name] is a 41 y.o. year old G40P1011 African American female who presents for placement of a Mirena IUD. Pregnancy test today was negative.   Patient's last menstrual period was 10/09/2016 (exact date). BP (!) 134/94   Pulse (!) 101   Wt 176 lb 3 oz (79.9 kg)   LMP 10/09/2016 (Exact Date)   BMI 28.44 kg/m   The risks and benefits of the method and placement have been thouroughly reviewed with the patient and all questions were answered.  Specifically the patient is aware of failure rate of 01/998, expulsion of the IUD and of possible perforation.  The patient is aware of irregular bleeding due to the method and understands the incidence of irregular bleeding diminishes with time.  Signed copy of informed consent in chart.   Time out was performed.  A medium plastic speculum was placed in the vagina.  The cervix was visualized, prepped using Betadine, and grasped with a single tooth tenaculum. The uterus was found to be anteroflexed and it sounded to 7 cm.  Mirena IUD placed per manufacturer's recommendations.   The strings were trimmed to 3 cm.  The patient was given post procedure instructions, including signs and symptoms of infection and to check for the strings after each menses or each month, and refraining from intercourse or anything in the vagina for 3 days.  She was given a Mirena care card with date Mirena placed, and date Mirena to be removed.  Reviewed red flag symptoms and when to call.   RTC x 4-6 weeks for IUD string check and transvaginal US due to history of lower abdominal pain.    Diona Fanti, CNM

## 2016-10-09 NOTE — Progress Notes (Signed)
Pt is here for a Mirena IUD insertion. LMP 10/09/16. Informed consent signed.

## 2016-10-09 NOTE — Patient Instructions (Signed)

## 2016-11-06 ENCOUNTER — Other Ambulatory Visit (INDEPENDENT_AMBULATORY_CARE_PROVIDER_SITE_OTHER): Payer: BLUE CROSS/BLUE SHIELD

## 2016-11-06 ENCOUNTER — Ambulatory Visit (INDEPENDENT_AMBULATORY_CARE_PROVIDER_SITE_OTHER): Payer: BLUE CROSS/BLUE SHIELD | Admitting: Certified Nurse Midwife

## 2016-11-06 ENCOUNTER — Encounter: Payer: Self-pay | Admitting: Certified Nurse Midwife

## 2016-11-06 VITALS — BP 131/87 | HR 102 | Wt 174.3 lb

## 2016-11-06 DIAGNOSIS — R1084 Generalized abdominal pain: Secondary | ICD-10-CM | POA: Diagnosis not present

## 2016-11-06 DIAGNOSIS — Z30431 Encounter for routine checking of intrauterine contraceptive device: Secondary | ICD-10-CM | POA: Diagnosis not present

## 2016-11-06 DIAGNOSIS — Z30430 Encounter for insertion of intrauterine contraceptive device: Secondary | ICD-10-CM

## 2016-11-06 DIAGNOSIS — D251 Intramural leiomyoma of uterus: Secondary | ICD-10-CM

## 2016-11-06 NOTE — Progress Notes (Signed)
     GYNECOLOGY OFFICE PROGRESS NOTE  History:  41 y.o. G2P1011 here today for today for IUD string check; Mirena IUD was placed  10/09/2016. No complaints about the IUD, no concerning side effects.  The following portions of the patient's history were reviewed and updated as appropriate: allergies, current medications, past family history, past medical history, past social history, past surgical history and problem list. Pap smear is due.   Review of Systems:   Pertinent items are noted in HPI.  Objective:  Physical Exam  Blood pressure 131/87, pulse (!) 102, weight 174 lb 5 oz (79.1 kg), last menstrual period 11/06/2016.   CONSTITUTIONAL: Well-developed, well-nourished female in no acute distress.   HENT:  Normocephalic, atraumatic. External right and left ear normal. Oropharynx is clear and moist  EYES: Conjunctivae and EOM are normal. Pupils are equal, round, and reactive to light. No scleral icterus.   NECK: Normal range of motion, supple, no masses  CARDIOVASCULAR: Normal heart rate noted  RESPIRATORY: Effort and breath sounds normal, no problems with respiration noted  ABDOMEN: Soft, no distention noted.    PELVIC: Normal appearing external genitalia; normal appearing vaginal mucosa and cervix.  IUD strings visualized, about 3 cm in length outside cervix.   ULTRASOUND REPORT  Location: [facility] Date of Service:  11/06/16   Indications: IUD check Findings:  The uterus measures 10.7 x 5.1 x 6.2cm. Echo texture is grossly homogeneous with evidence of a focal mass. Within the uterus is a suspected fibroid measuring: Fibroid 1: 1.9 x 2.0 x 2.4cm - LUS Anterior Intramural The Endometrium measures 4.9 mm. The IUD appears to be in the correct location within the endometrium with strings visible in the cervical canal.  Right Ovary was not visualized. Left Ovary was not visualized. Neither ovary was visualized due to overlying bowel gas. Survey of the  adnexa demonstrates no adnexal masses. There is no free fluid in the cul de sac.  Impression: 1. Slightly anteflexed uterus containing a fibroid in the LUS measuring 1.9 x 2.0 x 2.4cm. 2. IUD appears to be in correct location within the endometrium with strings visible in cervical canal.  Recommendations: 1.Clinical correlation with the patient's History and Physical Exam.  Assessment & Plan:   Normal IUD check.  Uterine fibroid present on ultrasound, results reviewed with patient.   Patient may keep IUD in place for five years; can come in for removal if she desires pregnancy within the next five years.  Routine preventative health maintenance measures emphasized.  Reviewed red flag symptoms and when to call.   RTC x 4 months for annual exam. RTC x 6 months for follow up US.    Diona Fanti, CNM     Diona Fanti, CNM [facility], [facility]

## 2016-11-06 NOTE — Patient Instructions (Signed)
Levonorgestrel intrauterine device (IUD) What is this medicine? LEVONORGESTREL IUD (LEE voe nor jes trel) is a contraceptive (birth control) device. The device is placed inside the uterus by a healthcare professional. It is used to prevent pregnancy. This device can also be used to treat heavy bleeding that occurs during your period. This medicine may be used for other purposes; ask your health care provider or pharmacist if you have questions. COMMON BRAND NAME(S): Kyleena, LILETTA, Mirena, Skyla What should I tell my health care provider before I take this medicine? They need to know if you have any of these conditions: -abnormal Pap smear -cancer of the breast, uterus, or cervix -diabetes -endometritis -genital or pelvic infection now or in the past -have more than one sexual partner or your partner has more than one partner -heart disease -history of an ectopic or tubal pregnancy -immune system problems -IUD in place -liver disease or tumor -problems with blood clots or take blood-thinners -seizures -use intravenous drugs -uterus of unusual shape -vaginal bleeding that has not been explained -an unusual or allergic reaction to levonorgestrel, other hormones, silicone, or polyethylene, medicines, foods, dyes, or preservatives -pregnant or trying to get pregnant -breast-feeding How should I use this medicine? This device is placed inside the uterus by a health care professional. Talk to your pediatrician regarding the use of this medicine in children. Special care may be needed. Overdosage: If you think you have taken too much of this medicine contact a poison control center or emergency room at once. NOTE: This medicine is only for you. Do not share this medicine with others. What if I miss a dose? This does not apply. Depending on the brand of device you have inserted, the device will need to be replaced every 3 to 5 years if you wish to continue using this type of birth  control. What may interact with this medicine? Do not take this medicine with any of the following medications: -amprenavir -bosentan -fosamprenavir This medicine may also interact with the following medications: -aprepitant -armodafinil -barbiturate medicines for inducing sleep or treating seizures -bexarotene -boceprevir -griseofulvin -medicines to treat seizures like carbamazepine, ethotoin, felbamate, oxcarbazepine, phenytoin, topiramate -modafinil -pioglitazone -rifabutin -rifampin -rifapentine -some medicines to treat HIV infection like atazanavir, efavirenz, indinavir, lopinavir, nelfinavir, tipranavir, ritonavir -St. John's wort -warfarin This list may not describe all possible interactions. Give your health care provider a list of all the medicines, herbs, non-prescription drugs, or dietary supplements you use. Also tell them if you smoke, drink alcohol, or use illegal drugs. Some items may interact with your medicine. What should I watch for while using this medicine? Visit your doctor or health care professional for regular check ups. See your doctor if you or your partner has sexual contact with others, becomes HIV positive, or gets a sexual transmitted disease. This product does not protect you against HIV infection (AIDS) or other sexually transmitted diseases. You can check the placement of the IUD yourself by reaching up to the top of your vagina with clean fingers to feel the threads. Do not pull on the threads. It is a good habit to check placement after each menstrual period. Call your doctor right away if you feel more of the IUD than just the threads or if you cannot feel the threads at all. The IUD may come out by itself. You may become pregnant if the device comes out. If you notice that the IUD has come out use a backup birth control method like condoms and call your   health care provider. Using tampons will not change the position of the IUD and are okay to use  during your period. This IUD can be safely scanned with magnetic resonance imaging (MRI) only under specific conditions. Before you have an MRI, tell your healthcare provider that you have an IUD in place, and which type of IUD you have in place. What side effects may I notice from receiving this medicine? Side effects that you should report to your doctor or health care professional as soon as possible: -allergic reactions like skin rash, itching or hives, swelling of the face, lips, or tongue -fever, flu-like symptoms -genital sores -high blood pressure -no menstrual period for 6 weeks during use -pain, swelling, warmth in the leg -pelvic pain or tenderness -severe or sudden headache -signs of pregnancy -stomach cramping -sudden shortness of breath -trouble with balance, talking, or walking -unusual vaginal bleeding, discharge -yellowing of the eyes or skin Side effects that usually do not require medical attention (report to your doctor or health care professional if they continue or are bothersome): -acne -breast pain -change in sex drive or performance -changes in weight -cramping, dizziness, or faintness while the device is being inserted -headache -irregular menstrual bleeding within first 3 to 6 months of use -nausea This list may not describe all possible side effects. Call your doctor for medical advice about side effects. You may report side effects to FDA at 1-800-FDA-1088. Where should I keep my medicine? This does not apply. NOTE: This sheet is a summary. It may not cover all possible information. If you have questions about this medicine, talk to your doctor, pharmacist, or health care provider.  2018 Elsevier/Gold Standard (2015-10-04 14:14:56) Uterine Fibroids Uterine fibroids are tissue masses (tumors) that can develop in the womb (uterus). They are also called leiomyomas. This type of tumor is not cancerous (benign) and does not spread to other parts of the body  outside of the pelvic area, which is between the hip bones. Occasionally, fibroids may develop in the fallopian tubes, in the cervix, or on the support structures (ligaments) that surround the uterus. You can have one or many fibroids. Fibroids can vary in size, weight, and where they grow in the uterus. Some can become quite large. Most fibroids do not require medical treatment. What are the causes? A fibroid can develop when a single uterine cell keeps growing (replicating). Most cells in the human body have a control mechanism that keeps them from replicating without control. What are the signs or symptoms? Symptoms may include:  Heavy bleeding during your period.  Bleeding or spotting between periods.  Pelvic pain and pressure.  Bladder problems, such as needing to urinate more often (urinary frequency) or urgently.  Inability to reproduce offspring (infertility).  Miscarriages.  How is this diagnosed? Uterine fibroids are diagnosed through a physical exam. Your health care provider may feel the lumpy tumors during a pelvic exam. Ultrasonography and an MRI may be done to determine the size, location, and number of fibroids. How is this treated? Treatment may include:  Watchful waiting. This involves getting the fibroid checked by your health care provider to see if it grows or shrinks. Follow your health care provider's recommendations for how often to have this checked.  Hormone medicines. These can be taken by mouth or given through an intrauterine device (IUD).  Surgery. ? Removing the fibroids (myomectomy) or the uterus (hysterectomy). ? Removing blood supply to the fibroids (uterine artery embolization).  If fibroids interfere with your fertility  and you want to become pregnant, your health care provider may recommend having the fibroids removed. Follow these instructions at home:  Keep all follow-up visits as directed by your health care provider. This is  important.  Take over-the-counter and prescription medicines only as told by your health care provider. ? If you were prescribed a hormone treatment, take the hormone medicines exactly as directed.  Ask your health care provider about taking iron pills and increasing the amount of dark green, leafy vegetables in your diet. These actions can help to boost your blood iron levels, which may be affected by heavy menstrual bleeding.  Pay close attention to your period and tell your health care provider about any changes, such as: ? Increased blood flow that requires you to use more pads or tampons than usual per month. ? A change in the number of days that your period lasts per month. ? A change in symptoms that are associated with your period, such as abdominal cramping or back pain. Contact a health care provider if:  You have pelvic pain, back pain, or abdominal cramps that cannot be controlled with medicines.  You have an increase in bleeding between and during periods.  You soak tampons or pads in a half hour or less.  You feel lightheaded, extra tired, or weak. Get help right away if:  You faint.  You have a sudden increase in pelvic pain. This information is not intended to replace advice given to you by your health care provider. Make sure you discuss any questions you have with your health care provider. Document Released: 12/20/1999 Document Revised: 08/22/2015 Document Reviewed: 06/20/2013 Elsevier Interactive Patient Education  [employer].

## 2016-11-06 NOTE — Progress Notes (Signed)
Pt is here for an IUD string check. States "my period came on this morning".

## 2017-03-19 ENCOUNTER — Encounter: Payer: BLUE CROSS/BLUE SHIELD | Admitting: Certified Nurse Midwife

## 2017-03-26 ENCOUNTER — Ambulatory Visit (INDEPENDENT_AMBULATORY_CARE_PROVIDER_SITE_OTHER): Payer: BLUE CROSS/BLUE SHIELD | Admitting: Certified Nurse Midwife

## 2017-03-26 ENCOUNTER — Encounter: Payer: Self-pay | Admitting: Certified Nurse Midwife

## 2017-03-26 VITALS — BP 129/89 | HR 99 | Ht 66.0 in | Wt 179.7 lb

## 2017-03-26 DIAGNOSIS — Z01419 Encounter for gynecological examination (general) (routine) without abnormal findings: Secondary | ICD-10-CM | POA: Diagnosis not present

## 2017-03-26 DIAGNOSIS — E119 Type 2 diabetes mellitus without complications: Secondary | ICD-10-CM

## 2017-03-26 DIAGNOSIS — Z975 Presence of (intrauterine) contraceptive device: Secondary | ICD-10-CM | POA: Diagnosis not present

## 2017-03-26 DIAGNOSIS — Z1239 Encounter for other screening for malignant neoplasm of breast: Secondary | ICD-10-CM

## 2017-03-26 DIAGNOSIS — Z8342 Family history of familial hypercholesterolemia: Secondary | ICD-10-CM | POA: Diagnosis not present

## 2017-03-26 DIAGNOSIS — Z8249 Family history of ischemic heart disease and other diseases of the circulatory system: Secondary | ICD-10-CM | POA: Diagnosis not present

## 2017-03-26 DIAGNOSIS — Z124 Encounter for screening for malignant neoplasm of cervix: Secondary | ICD-10-CM

## 2017-03-26 NOTE — Patient Instructions (Signed)
Preventive Care 40-64 Years, Female Preventive care refers to lifestyle choices and visits with your health care provider that can promote health and wellness. What does preventive care include?  A yearly physical exam. This is also called an annual well check.  Dental exams once or twice a year.  Routine eye exams. Ask your health care provider how often you should have your eyes checked.  Personal lifestyle choices, including: ? Daily care of your teeth and gums. ? Regular physical activity. ? Eating a healthy diet. ? Avoiding tobacco and drug use. ? Limiting alcohol use. ? Practicing safe sex. ? Taking low-dose aspirin daily starting at age 42. ? Taking vitamin and mineral supplements as recommended by your health care provider. What happens during an annual well check? The services and screenings done by your health care provider during your annual well check will depend on your age, overall health, lifestyle risk factors, and family history of disease. Counseling Your health care provider may ask you questions about your:  Alcohol use.  Tobacco use.  Drug use.  Emotional well-being.  Home and relationship well-being.  Sexual activity.  Eating habits.  Work and work Statistician.  Method of birth control.  Menstrual cycle.  Pregnancy history.  Screening You may have the following tests or measurements:  Height, weight, and BMI.  Blood pressure.  Lipid and cholesterol levels. These may be checked every 5 years, or more frequently if you are over 42 years old.  Skin check.  Lung cancer screening. You may have this screening every year starting at age 42 if you have a 30-pack-year history of smoking and currently smoke or have quit within the past 15 years.  Fecal occult blood test (FOBT) of the stool. You may have this test every year starting at age 42.  Flexible sigmoidoscopy or colonoscopy. You may have a sigmoidoscopy every 5 years or a colonoscopy  every 10 years starting at age 42.  Hepatitis C blood test.  Hepatitis B blood test.  Sexually transmitted disease (STD) testing.  Diabetes screening. This is done by checking your blood sugar (glucose) after you have not eaten for a while (fasting). You may have this done every 1-3 years.  Mammogram. This may be done every 1-2 years. Talk to your health care provider about when you should start having regular mammograms. This may depend on whether you have a family history of breast cancer.  BRCA-related cancer screening. This may be done if you have a family history of breast, ovarian, tubal, or peritoneal cancers.  Pelvic exam and Pap test. This may be done every 3 years starting at age 42. Starting at age 36, this may be done every 5 years if you have a Pap test in combination with an HPV test.  Bone density scan. This is done to screen for osteoporosis. You may have this scan if you are at high risk for osteoporosis.  Discuss your test results, treatment options, and if necessary, the need for more tests with your health care provider. Vaccines Your health care provider may recommend certain vaccines, such as:  Influenza vaccine. This is recommended every year.  Tetanus, diphtheria, and acellular pertussis (Tdap, Td) vaccine. You may need a Td booster every 10 years.  Varicella vaccine. You may need this if you have not been vaccinated.  Zoster vaccine. You may need this after age 42.  Measles, mumps, and rubella (MMR) vaccine. You may need at least one dose of MMR if you were born in  1957 or later. You may also need a second dose.  Pneumococcal 13-valent conjugate (PCV13) vaccine. You may need this if you have certain conditions and were not previously vaccinated.  Pneumococcal polysaccharide (PPSV23) vaccine. You may need one or two doses if you smoke cigarettes or if you have certain conditions.  Meningococcal vaccine. You may need this if you have certain  conditions.  Hepatitis A vaccine. You may need this if you have certain conditions or if you travel or work in places where you may be exposed to hepatitis A.  Hepatitis B vaccine. You may need this if you have certain conditions or if you travel or work in places where you may be exposed to hepatitis B.  Haemophilus influenzae type b (Hib) vaccine. You may need this if you have certain conditions.  Talk to your health care provider about which screenings and vaccines you need and how often you need them. This information is not intended to replace advice given to you by your health care provider. Make sure you discuss any questions you have with your health care provider. Document Released: 01/18/2015 Document Revised: 09/11/2015 Document Reviewed: 10/23/2014 Elsevier Interactive Patient Education  2018 Elsevier Inc.  

## 2017-03-26 NOTE — Progress Notes (Signed)
ANNUAL PREVENTATIVE CARE GYN  ENCOUNTER NOTE  Subjective:       Rose [name] is a 42 y.o. G37P1011 female here for a routine annual gynecologic exam.  Current complaints: 1. Spotting with IUD in place.   Denies difficult breathing or respiratory distress, chest pain, abdominal pain, vaginal bleeding, dysuria, and leg pain or swelling.     Gynecologic History  No LMP recorded. (Menstrual status: IUD).  Contraception: IUD  Last Pap: unknown.   Last mammogram: due.   Obstetric History OB History  Gravida Para Term Preterm AB Living  2 1 1   1 1   SAB TAB Ectopic Multiple Live Births  1       1    # Outcome Date GA Lbr Len/2nd Weight Sex Delivery Anes PTL Lv  2 SAB 2008          1 Term 1993    F CS-Unspec  N LIV    Past Medical History:  Diagnosis Date  . Anxiety   . Diabetes mellitus without complication ([place])   . Neck pain   . Right leg pain     Past Surgical History:  Procedure Laterality Date  . CESAREAN SECTION    . CHOLECYSTECTOMY    . LAPAROSCOPY N/A 08/24/2013   Procedure: LAPAROSCOPY OPERATIVE with lysis of adhesions ;  Surgeon: Marylynn Pearson, MD;  Location: [facility];  Service: Gynecology;  Laterality: N/A;  . wrist, cyst removal      Current Outpatient Medications on File Prior to Visit  Medication Sig Dispense Refill  . glimepiride (AMARYL) 4 MG tablet Take 1 tablet (4 mg total) by mouth daily before breakfast. 30 tablet 3  . glucose blood (ONE TOUCH ULTRA TEST) test strip 1 each by Other route daily. And lancets 1/day 250.00 100 each 12  . ibuprofen (ADVIL,MOTRIN) 800 MG tablet Take 1 tablet (800 mg total) by mouth every 8 (eight) hours as needed. 60 tablet 1  . insulin detemir (LEVEMIR) 100 UNIT/ML injection Use up to 50 units per day, as per MD instructions    . Lancets MISC Dispense 100 lancets, ok to sub any brand preferred by insurance/patient, use up to 3x/day, dx diabetes    . metFORMIN (GLUCOPHAGE) 1000 MG tablet Take 1,000 mg by mouth.    Glory Rosebush DELICA LANCETS FINE MISC by Does not apply route.    . simvastatin (ZOCOR) 20 MG tablet Take 20 mg by mouth.     No current facility-administered medications on file prior to visit.     Allergies  Allergen Reactions  . Sulfamethoxazole-Trimethoprim Swelling  . Bactrim [Sulfamethoxazole-Trimethoprim] Swelling  . Neurontin [Gabapentin] Itching  . Other Swelling    Unknown antibiotic    Social History   Socioeconomic History  . Marital status: Single    Spouse name: Not on file  . Number of children: 1  . Years of education: GED  . Highest education level: Not on file  Occupational History    Employer: [employer]  Social Needs  . Financial resource strain: Not on file  . Food insecurity:    Worry: Not on file    Inability: Not on file  . Transportation needs:    Medical: Not on file    Non-medical: Not on file  Tobacco Use  . Smoking status: Never Smoker  . Smokeless tobacco: Never Used  Substance and Sexual Activity  . Alcohol use: No    Alcohol/week: 0.5 oz    Types: 1 Standard drinks  or equivalent per week  . Drug use: No  . Sexual activity: Yes    Birth control/protection: IUD  Lifestyle  . Physical activity:    Days per week: Not on file    Minutes per session: Not on file  . Stress: Not on file  Relationships  . Social connections:    Talks on phone: Not on file    Gets together: Not on file    Attends religious service: Not on file    Active member of club or organization: Not on file    Attends meetings of clubs or organizations: Not on file    Relationship status: Not on file  . Intimate partner violence:    Fear of current or ex partner: Not on file    Emotionally abused: Not on file    Physically abused: Not on file    Forced sexual activity: Not on file  Other Topics Concern  . Not on file  Social History Narrative   Patient is single and she takes care of her daughter. Patient works full time Web designer.   Education GED.    Right handed.   Caffeine one cup of coffee daily and soda daily.   Children One        Family History  Problem Relation Age of Onset  . Aneurysm Mother   . Blindness Father   . High blood pressure Father   . Diabetes Neg Hx     The following portions of the patient's history were reviewed and updated as appropriate: allergies, current medications, past family history, past medical history, past social history, past surgical history and problem list.  Review of Systems  ROS negative except as noted above. Information obtained from patient.   Objective:   BP 129/89   Pulse 99   Ht 5\' 6"  (1.676 m)   Wt 179 lb 11.2 oz (81.5 kg)   BMI 29.00 kg/m    CONSTITUTIONAL: Well-developed, well-nourished female in no acute distress.   PSYCHIATRIC: Normal mood and affect. Normal behavior. Normal judgment and thought content.  [place]: Alert and oriented to person, place, and time. Normal muscle tone coordination. No cranial  nerve deficit noted.  HENT:  Normocephalic, atraumatic, External right and left ear normal.   EYES: Conjunctivae and EOM are normal. Pupils are equal and round. No scleral icterus.   NECK: Normal range of motion, supple, no masses.  Normal thyroid.   SKIN: Skin is warm and dry. No rash noted. Not diaphoretic. No erythema. No pallor.  CARDIOVASCULAR: Normal heart rate noted, regular rhythm, no murmur.  RESPIRATORY: Clear to auscultation bilaterally. Effort and breath sounds normal, no problems with respiration noted.  BREASTS: Symmetric in size. No masses, skin changes, nipple drainage, or lymphadenopathy.  ABDOMEN: Soft, normal bowel sounds, no distention noted.  No tenderness, rebound or guarding.   PELVIC:  External Genitalia: Normal  Vagina: Normal  Cervix: Normal  Uterus: Normal  Adnexa: Normal   MUSCULOSKELETAL: Normal range of motion. No tenderness.  No cyanosis, clubbing, or edema.  2+ distal pulses.  LYMPHATIC: No Axillary,  Supraclavicular, or Inguinal Adenopathy.  Assessment:   Annual gynecologic examination 42 y.o. Contraception: IUD Overweight Problem List Items Addressed This Visit    None    Visit Diagnoses    Well woman exam with routine gynecological exam    -  Primary      Plan:   Pap: Pap Co Test  Mammogram: Ordered  Labs: See orders  Routine preventative health  maintenance measures emphasized: Exercise/Diet/Weight control, Tobacco Warnings, Alcohol/Substance use risks, Stress Management, Peer Pressure Issues and Safe Sex  Reviewed red flag symptoms and when to call.   RTC x 1 year for Annual Exam or sooner if needed.   Diona Fanti, CNM [facility], [facility]

## 2017-03-26 NOTE — Progress Notes (Signed)
Pt is doing well.

## 2017-03-27 LAB — COMPREHENSIVE METABOLIC PANEL
ALK PHOS: 53 IU/L (ref 39–117)
ALT: 12 IU/L (ref 0–32)
AST: 14 IU/L (ref 0–40)
Albumin/Globulin Ratio: 1.5 (ref 1.2–2.2)
Albumin: 4.2 g/dL (ref 3.5–5.5)
BUN/Creatinine Ratio: 13 (ref 9–23)
BUN: 9 mg/dL (ref 6–24)
Bilirubin Total: 0.3 mg/dL (ref 0.0–1.2)
CALCIUM: 9.5 mg/dL (ref 8.7–10.2)
CO2: 26 mmol/L (ref 20–29)
CREATININE: 0.68 mg/dL (ref 0.57–1.00)
Chloride: 99 mmol/L (ref 96–106)
GFR calc Af Amer: 125 mL/min/{1.73_m2} (ref >59)
GFR, EST NON AFRICAN AMERICAN: 108 mL/min/{1.73_m2} (ref >59)
GLOBULIN, TOTAL: 2.8 g/dL (ref 1.5–4.5)
GLUCOSE: 57 mg/dL — AB (ref 65–99)
Potassium: 3.8 mmol/L (ref 3.5–5.2)
Sodium: 139 mmol/L (ref 134–144)
Total Protein: 7 g/dL (ref 6.0–8.5)

## 2017-03-27 LAB — LIPID PANEL
CHOLESTEROL TOTAL: 167 mg/dL (ref 100–199)
Chol/HDL Ratio: 3.9 ratio (ref 0.0–4.4)
HDL: 43 mg/dL (ref >39)
LDL CALC: 105 mg/dL — AB (ref 0–99)
Triglycerides: 97 mg/dL (ref 0–149)
VLDL CHOLESTEROL CAL: 19 mg/dL (ref 5–40)

## 2017-03-27 LAB — CBC
HEMOGLOBIN: 12 g/dL (ref 11.1–15.9)
Hematocrit: 36 % (ref 34.0–46.6)
MCH: 25.4 pg — ABNORMAL LOW (ref 26.6–33.0)
MCHC: 33.3 g/dL (ref 31.5–35.7)
MCV: 76 fL — ABNORMAL LOW (ref 79–97)
Platelets: 351 10*3/uL (ref 150–379)
RBC: 4.73 x10E6/uL (ref 3.77–5.28)
RDW: 15 % (ref 12.3–15.4)
WBC: 9 10*3/uL (ref 3.4–10.8)

## 2017-03-27 LAB — THYROID PANEL WITH TSH
FREE THYROXINE INDEX: 1.3 (ref 1.2–4.9)
T3 UPTAKE RATIO: 23 % — AB (ref 24–39)
T4 TOTAL: 5.7 ug/dL (ref 4.5–12.0)
TSH: 2.08 u[IU]/mL (ref 0.450–4.500)

## 2017-03-30 LAB — PAPIG, HPV, RFX 16/18
HPV, HIGH-RISK: POSITIVE — AB
PAP SMEAR COMMENT: 0

## 2017-03-31 DIAGNOSIS — Z8342 Family history of familial hypercholesterolemia: Secondary | ICD-10-CM | POA: Insufficient documentation

## 2017-03-31 DIAGNOSIS — Z8249 Family history of ischemic heart disease and other diseases of the circulatory system: Secondary | ICD-10-CM | POA: Insufficient documentation

## 2017-03-31 DIAGNOSIS — Z975 Presence of (intrauterine) contraceptive device: Secondary | ICD-10-CM | POA: Insufficient documentation

## 2017-04-01 ENCOUNTER — Other Ambulatory Visit: Payer: Self-pay | Admitting: Certified Nurse Midwife

## 2017-04-01 DIAGNOSIS — R928 Other abnormal and inconclusive findings on diagnostic imaging of breast: Secondary | ICD-10-CM

## 2017-04-05 ENCOUNTER — Telehealth: Payer: Self-pay

## 2017-04-05 ENCOUNTER — Other Ambulatory Visit: Payer: Self-pay

## 2017-04-05 ENCOUNTER — Telehealth: Payer: Self-pay | Admitting: *Deleted

## 2017-04-05 DIAGNOSIS — Z1239 Encounter for other screening for malignant neoplasm of breast: Secondary | ICD-10-CM

## 2017-04-05 NOTE — Telephone Encounter (Signed)
Message left on voice mail.

## 2017-04-05 NOTE — Telephone Encounter (Signed)
Patient called and is incurring about her lab results. Patient is requesting a call back. Her call back # is [phone]  She also states.she called [facility] and she states that they need a Diagnostic order sent over in the system before she can schedule her appt. Please advise.

## 2017-04-05 NOTE — Telephone Encounter (Signed)
Spoke with pt- reviewed lab results with her. She states she has zocor from her PCP that she just started taking. Also reviewed pap smear results, HPV and colposcopy. Information on those 2 things were mailed to pt per her request.

## 2017-04-05 NOTE — Telephone Encounter (Signed)
Pt called back regarding a message about her results. Please call pt. Thank you

## 2017-04-12 ENCOUNTER — Telehealth: Payer: Self-pay

## 2017-04-12 NOTE — Telephone Encounter (Signed)
Patient called and states she received information regarding Colposcopy and she is wondering if she can go ahead and schedule for that procedure or do she have to wait for the repeat Pap in 2-3 months . Patient is requesting a call back. Her # is [phone] Please advise. Thank you

## 2017-04-12 NOTE — Telephone Encounter (Signed)
Spoke with pt- reassured her about her concerns. She will keep appointment for a repap in June. Advised to please contact us should she have any further questions or concerns.

## 2017-04-13 NOTE — Telephone Encounter (Signed)
Returned my call. She thought about it and she wants to proceed with a repap. She will call and make an appointment if she changes her mind about a colposcopy.

## 2017-05-05 ENCOUNTER — Other Ambulatory Visit: Payer: Self-pay | Admitting: Certified Nurse Midwife

## 2017-05-05 DIAGNOSIS — N921 Excessive and frequent menstruation with irregular cycle: Secondary | ICD-10-CM

## 2017-05-05 DIAGNOSIS — Z975 Presence of (intrauterine) contraceptive device: Principal | ICD-10-CM

## 2017-05-19 ENCOUNTER — Other Ambulatory Visit: Payer: BLUE CROSS/BLUE SHIELD

## 2017-05-24 ENCOUNTER — Ambulatory Visit
Admission: RE | Admit: 2017-05-24 | Discharge: 2017-05-24 | Disposition: A | Discharge status: Home / Self Care | Payer: BLUE CROSS/BLUE SHIELD | Source: Ambulatory Visit | Attending: Certified Nurse Midwife | Admitting: Certified Nurse Midwife

## 2017-05-24 DIAGNOSIS — R928 Other abnormal and inconclusive findings on diagnostic imaging of breast: Secondary | ICD-10-CM

## 2017-06-07 ENCOUNTER — Ambulatory Visit (INDEPENDENT_AMBULATORY_CARE_PROVIDER_SITE_OTHER): Payer: BLUE CROSS/BLUE SHIELD

## 2017-06-07 ENCOUNTER — Ambulatory Visit (INDEPENDENT_AMBULATORY_CARE_PROVIDER_SITE_OTHER): Payer: BLUE CROSS/BLUE SHIELD | Admitting: Certified Nurse Midwife

## 2017-06-07 VITALS — BP 128/85 | HR 100 | Ht 66.0 in | Wt 177.4 lb

## 2017-06-07 DIAGNOSIS — N921 Excessive and frequent menstruation with irregular cycle: Secondary | ICD-10-CM

## 2017-06-07 DIAGNOSIS — R87615 Unsatisfactory cytologic smear of cervix: Secondary | ICD-10-CM | POA: Diagnosis not present

## 2017-06-07 DIAGNOSIS — Z975 Presence of (intrauterine) contraceptive device: Secondary | ICD-10-CM | POA: Diagnosis not present

## 2017-06-07 NOTE — Patient Instructions (Addendum)
Uterine Fibroids Uterine fibroids are tissue masses (tumors) that can develop in the womb (uterus). They are also called leiomyomas. This type of tumor is not cancerous (benign) and does not spread to other parts of the body outside of the pelvic area, which is between the hip bones. Occasionally, fibroids may develop in the fallopian tubes, in the cervix, or on the support structures (ligaments) that surround the uterus. You can have one or many fibroids. Fibroids can vary in size, weight, and where they grow in the uterus. Some can become quite large. Most fibroids do not require medical treatment. What are the causes? A fibroid can develop when a single uterine cell keeps growing (replicating). Most cells in the human body have a control mechanism that keeps them from replicating without control. What are the signs or symptoms? Symptoms may include:  Heavy bleeding during your period.  Bleeding or spotting between periods.  Pelvic pain and pressure.  Bladder problems, such as needing to urinate more often (urinary frequency) or urgently.  Inability to reproduce offspring (infertility).  Miscarriages.  How is this diagnosed? Uterine fibroids are diagnosed through a physical exam. Your health care provider may feel the lumpy tumors during a pelvic exam. Ultrasonography and an MRI may be done to determine the size, location, and number of fibroids. How is this treated? Treatment may include:  Watchful waiting. This involves getting the fibroid checked by your health care provider to see if it grows or shrinks. Follow your health care provider's recommendations for how often to have this checked.  Hormone medicines. These can be taken by mouth or given through an intrauterine device (IUD).  Surgery. ? Removing the fibroids (myomectomy) or the uterus (hysterectomy). ? Removing blood supply to the fibroids (uterine artery embolization).  If fibroids interfere with your fertility and you  want to become pregnant, your health care provider may recommend having the fibroids removed. Follow these instructions at home:  Keep all follow-up visits as directed by your health care provider. This is important.  Take over-the-counter and prescription medicines only as told by your health care provider. ? If you were prescribed a hormone treatment, take the hormone medicines exactly as directed.  Ask your health care provider about taking iron pills and increasing the amount of dark green, leafy vegetables in your diet. These actions can help to boost your blood iron levels, which may be affected by heavy menstrual bleeding.  Pay close attention to your period and tell your health care provider about any changes, such as: ? Increased blood flow that requires you to use more pads or tampons than usual per month. ? A change in the number of days that your period lasts per month. ? A change in symptoms that are associated with your period, such as abdominal cramping or back pain. Contact a health care provider if:  You have pelvic pain, back pain, or abdominal cramps that cannot be controlled with medicines.  You have an increase in bleeding between and during periods.  You soak tampons or pads in a half hour or less.  You feel lightheaded, extra tired, or weak. Get help right away if:  You faint.  You have a sudden increase in pelvic pain. This information is not intended to replace advice given to you by your health care provider. Make sure you discuss any questions you have with your health care provider. Document Released: 12/20/1999 Document Revised: 08/22/2015 Document Reviewed: 06/20/2013 Elsevier Interactive Patient Education  2018 Elsevier Inc.    Preventive Care 40-64 Years, Female Preventive care refers to lifestyle choices and visits with your health care provider that can promote health and wellness. What does preventive care include?  A yearly physical exam. This is  also called an annual well check.  Dental exams once or twice a year.  Routine eye exams. Ask your health care provider how often you should have your eyes checked.  Personal lifestyle choices, including: ? Daily care of your teeth and gums. ? Regular physical activity. ? Eating a healthy diet. ? Avoiding tobacco and drug use. ? Limiting alcohol use. ? Practicing safe sex. ? Taking low-dose aspirin daily starting at age 52. ? Taking vitamin and mineral supplements as recommended by your health care provider. What happens during an annual well check? The services and screenings done by your health care provider during your annual well check will depend on your age, overall health, lifestyle risk factors, and family history of disease. Counseling Your health care provider may ask you questions about your:  Alcohol use.  Tobacco use.  Drug use.  Emotional well-being.  Home and relationship well-being.  Sexual activity.  Eating habits.  Work and work Statistician.  Method of birth control.  Menstrual cycle.  Pregnancy history.  Screening You may have the following tests or measurements:  Height, weight, and BMI.  Blood pressure.  Lipid and cholesterol levels. These may be checked every 5 years, or more frequently if you are over 80 years old.  Skin check.  Lung cancer screening. You may have this screening every year starting at age 86 if you have a 30-pack-year history of smoking and currently smoke or have quit within the past 15 years.  Fecal occult blood test (FOBT) of the stool. You may have this test every year starting at age 18.  Flexible sigmoidoscopy or colonoscopy. You may have a sigmoidoscopy every 5 years or a colonoscopy every 10 years starting at age 50.  Hepatitis C blood test.  Hepatitis B blood test.  Sexually transmitted disease (STD) testing.  Diabetes screening. This is done by checking your blood sugar (glucose) after you have not  eaten for a while (fasting). You may have this done every 1-3 years.  Mammogram. This may be done every 1-2 years. Talk to your health care provider about when you should start having regular mammograms. This may depend on whether you have a family history of breast cancer.  BRCA-related cancer screening. This may be done if you have a family history of breast, ovarian, tubal, or peritoneal cancers.  Pelvic exam and Pap test. This may be done every 3 years starting at age 96. Starting at age 53, this may be done every 5 years if you have a Pap test in combination with an HPV test.  Bone density scan. This is done to screen for osteoporosis. You may have this scan if you are at high risk for osteoporosis.  Discuss your test results, treatment options, and if necessary, the need for more tests with your health care provider. Vaccines Your health care provider may recommend certain vaccines, such as:  Influenza vaccine. This is recommended every year.  Tetanus, diphtheria, and acellular pertussis (Tdap, Td) vaccine. You may need a Td booster every 10 years.  Varicella vaccine. You may need this if you have not been vaccinated.  Zoster vaccine. You may need this after age 35.  Measles, mumps, and rubella (MMR) vaccine. You may need at least one dose of MMR if you were born  in 1957 or later. You may also need a second dose.  Pneumococcal 13-valent conjugate (PCV13) vaccine. You may need this if you have certain conditions and were not previously vaccinated.  Pneumococcal polysaccharide (PPSV23) vaccine. You may need one or two doses if you smoke cigarettes or if you have certain conditions.  Meningococcal vaccine. You may need this if you have certain conditions.  Hepatitis A vaccine. You may need this if you have certain conditions or if you travel or work in places where you may be exposed to hepatitis A.  Hepatitis B vaccine. You may need this if you have certain conditions or if you  travel or work in places where you may be exposed to hepatitis B.  Haemophilus influenzae type b (Hib) vaccine. You may need this if you have certain conditions.  Talk to your health care provider about which screenings and vaccines you need and how often you need them. This information is not intended to replace advice given to you by your health care provider. Make sure you discuss any questions you have with your health care provider. Document Released: 01/18/2015 Document Revised: 09/11/2015 Document Reviewed: 10/23/2014 Elsevier Interactive Patient Education  [employer].

## 2017-06-07 NOTE — Progress Notes (Signed)
GYN ENCOUNTER NOTE  Subjective:       Rose [name] is a 42 y.o. G66P1011 female is here for gynecologic evaluation of the following issues:  1. Repeat Pap collection, previous specimen unsatisfactory for evaluation 2. Breakthrough bleeding with IUD-reports intermittent spotting since placement  Denies difficulty breathing or respiratory distress, chest pain, abdominal pain, excessive vaginal bleeding, dysuria, and leg pain or swelling.    Gynecologic History  No LMP recorded. (Menstrual status: IUD).  Contraception: IUD, Mirena  Last Pap: 03/26/2017. Results were: HPV+  Last mammogram: 05/24/2017. Results were: BI-RADS 2  Obstetric History OB History  Gravida Para Term Preterm AB Living  2 1 1   1 1   SAB TAB Ectopic Multiple Live Births  1       1    # Outcome Date GA Lbr Len/2nd Weight Sex Delivery Anes PTL Lv  2 SAB 2008          1 Term 1993    F CS-Unspec  N LIV    Past Medical History:  Diagnosis Date  . Anxiety   . Diabetes mellitus without complication ([place])   . Neck pain   . Right leg pain     Past Surgical History:  Procedure Laterality Date  . CESAREAN SECTION    . CHOLECYSTECTOMY    . LAPAROSCOPY N/A 08/24/2013   Procedure: LAPAROSCOPY OPERATIVE with lysis of adhesions ;  Surgeon: Marylynn Pearson, MD;  Location: [facility];  Service: Gynecology;  Laterality: N/A;  . wrist, cyst removal      Current Outpatient Medications on File Prior to Visit  Medication Sig Dispense Refill  . glimepiride (AMARYL) 4 MG tablet Take 1 tablet (4 mg total) by mouth daily before breakfast. 30 tablet 3  . glucose blood (ONE TOUCH ULTRA TEST) test strip 1 each by Other route daily. And lancets 1/day 250.00 100 each 12  . ibuprofen (ADVIL,MOTRIN) 800 MG tablet Take 1 tablet (800 mg total) by mouth every 8 (eight) hours as needed. 60 tablet 1  . insulin detemir (LEVEMIR) 100 UNIT/ML injection Use up to 50 units per day, as per MD instructions    . Lancets MISC Dispense 100  lancets, ok to sub any brand preferred by insurance/patient, use up to 3x/day, dx diabetes    . metFORMIN (GLUCOPHAGE) 1000 MG tablet Take 1,000 mg by mouth.    Glory Rosebush DELICA LANCETS FINE MISC by Does not apply route.    . simvastatin (ZOCOR) 20 MG tablet Take 20 mg by mouth.     No current facility-administered medications on file prior to visit.     Allergies  Allergen Reactions  . Sulfamethoxazole-Trimethoprim Swelling  . Bactrim [Sulfamethoxazole-Trimethoprim] Swelling  . Neurontin [Gabapentin] Itching  . Other Swelling    Unknown antibiotic    Social History   Socioeconomic History  . Marital status: Single    Spouse name: Not on file  . Number of children: 1  . Years of education: GED  . Highest education level: Not on file  Occupational History    Employer: [employer]  Social Needs  . Financial resource strain: Not on file  . Food insecurity:    Worry: Not on file    Inability: Not on file  . Transportation needs:    Medical: Not on file    Non-medical: Not on file  Tobacco Use  . Smoking status: Never Smoker  . Smokeless tobacco: Never Used  Substance and Sexual Activity  . Alcohol use: No  Alcohol/week: 0.5 oz    Types: 1 Standard drinks or equivalent per week  . Drug use: No  . Sexual activity: Yes    Birth control/protection: IUD  Lifestyle  . Physical activity:    Days per week: Not on file    Minutes per session: Not on file  . Stress: Not on file  Relationships  . Social connections:    Talks on phone: Not on file    Gets together: Not on file    Attends religious service: Not on file    Active member of club or organization: Not on file    Attends meetings of clubs or organizations: Not on file    Relationship status: Not on file  . Intimate partner violence:    Fear of current or ex partner: Not on file    Emotionally abused: Not on file    Physically abused: Not on file    Forced sexual activity: Not on file  Other Topics  Concern  . Not on file  Social History Narrative   Patient is single and she takes care of her daughter. Patient works full time Web designer.   Education GED.   Right handed.   Caffeine one cup of coffee daily and soda daily.   Children One        Family History  Problem Relation Age of Onset  . Aneurysm Mother   . Blindness Father   . High blood pressure Father   . Diabetes Neg Hx     The following portions of the patient's history were reviewed and updated as appropriate: allergies, current medications, past family history, past medical history, past social history, past surgical history and problem list.  Review of Systems  ROS negative except as noted above. Information obtained from patient.   Objective:   BP 128/85   Pulse 100   Ht 5\' 6"  (1.676 m)   Wt 177 lb 7 oz (80.5 kg)   BMI 28.64 kg/m   CONSTITUTIONAL: Well-developed, well-nourished female in no acute distress.   PELVIC:  External Genitalia: Normal  Vagina: Normal  Cervix: Normal   ULTRASOUND REPORT  Location: [facility] Date of Service:  06/07/2017   Indications: Breakthrough bleeding with IUD Findings:  The uterus measures 9.1 x 4.3 x 3.5 cm. Echo texture is homogeneous with evidence of focal masses. Within the uterus is a suspected fibroid measuring: Fibroid 1: Anterior, Subserosal, 2.0 x 2.6 x 2.0 cm - It does not appear to be abutting or invading the endometrial cavity. The Endometrium measures 2.7 mm. The IUD appears to be in the correct location within the endometrium.  Right Ovary measures 2.4 x 1.8 x 1.5 cm. It is normal in appearance. Left Ovary measures 2.0 x 1.3 x 1.3 cm. It is normal appearance. Survey of the adnexa demonstrates no adnexal masses. There is no free fluid in the cul de sac.  Impression: 1. Slightly anteflexed uterus measures 9.1 x 4.3 x 3.5 cm. 2. Anterior, subserosal fibroid measuring 2.0 x 2.6 x 2.0 cm - does not appear to be invading the  endometrial cavity. 3. Endometrium measures 2.7 mm. 4. IUD appears to be in the correct location. 5. Bilateral ovaries appear WNL.  Recommendations: 1.Clinical correlation with the patient's History and Physical Exam. Assessment:   1. Encounter for repeat Papanicolaou smear of cervix due to previous unsatisfactory results  - PapIG, HPV, rfx 16/18  2. Breakthrough bleeding associated with intrauterine device (IUD)   Plan:  Ultrasound findings reviewed with patient; verbalized understanding.   Discussed incidence of irregular bleeding associated with IUDs and various treatment options.   Labs: Pap, see orders. Will contact patient with results and follow up as needed.  Reviewed red flag symptoms and when to call.   RTC as previously scheduled or sooner if needed.    Diona Fanti, CNM [facility], [facility]

## 2017-06-09 LAB — PAPIG, HPV, RFX 16/18
HPV, high-risk: NEGATIVE
PAP SMEAR COMMENT: 0

## 2017-06-10 ENCOUNTER — Telehealth: Payer: Self-pay

## 2017-06-10 NOTE — Telephone Encounter (Signed)
Pt informed of MLs instructions.

## 2017-06-10 NOTE — Telephone Encounter (Signed)
Message left on pts voicemail to return my call.

## 2017-06-10 NOTE — Telephone Encounter (Signed)
The patient called back on her lunch break but was not able to hold and requested a return call after 3 PM today when she gets off work.  Please contact her at [phone].  Thank you

## 2017-07-07 ENCOUNTER — Telehealth: Payer: Self-pay | Admitting: Certified Nurse Midwife

## 2017-07-07 NOTE — Telephone Encounter (Signed)
The patient is requesting a call; she saw Sharyn Lull 06/07/17 and is having IUD issues with breakthrough bleeding.  She is not sure if she needs to come in, needs advice, please advise, thanks.

## 2017-07-12 ENCOUNTER — Telehealth: Payer: Self-pay

## 2017-07-12 NOTE — Telephone Encounter (Signed)
Message left on patients voicemail- Reiterated IUD is in place on U/S done 06/07/17. Asked patient to let me know if she has other questions or concerns.

## 2018-03-19 ENCOUNTER — Emergency Department
Admission: EM | Admit: 2018-03-19 | Discharge: 2018-03-19 | Disposition: A | Discharge status: Home / Self Care | Payer: BLUE CROSS/BLUE SHIELD | Attending: Emergency Medicine | Admitting: Emergency Medicine

## 2018-03-19 ENCOUNTER — Other Ambulatory Visit: Payer: Self-pay

## 2018-03-19 DIAGNOSIS — E119 Type 2 diabetes mellitus without complications: Secondary | ICD-10-CM | POA: Diagnosis not present

## 2018-03-19 DIAGNOSIS — Z79899 Other long term (current) drug therapy: Secondary | ICD-10-CM | POA: Diagnosis not present

## 2018-03-19 DIAGNOSIS — R5383 Other fatigue: Secondary | ICD-10-CM | POA: Diagnosis not present

## 2018-03-19 LAB — GLUCOSE, CAPILLARY: GLUCOSE-CAPILLARY: 193 mg/dL — AB (ref 70–99)

## 2018-03-19 NOTE — ED Provider Notes (Signed)
[facility] Emergency Department Provider Note  Time seen: 7:18 AM  I have reviewed the triage vital signs and the nursing notes.   HISTORY  Chief Complaint Fatigue   HPI Rose [name] is a 43 y.o. female with a past medical history of anxiety, diabetes, presents to the emergency department with concerns over possible upper respiratory infection.  According to the patient she woke this morning feeling more tired than usual, also states she thought that her throat might be getting sore but states it is not sore yet.  Patient denies any fever, cough or congestion.  Largely negative review of systems.   Past Medical History:  Diagnosis Date  . Anxiety   . Diabetes mellitus without complication ([place])   . Neck pain   . Right leg pain     Patient Active Problem List   Diagnosis Date Noted  . Family history of high cholesterol 03/31/2017  . Family history of hypertension 03/31/2017  . IUD (intrauterine device) in place 03/31/2017  . Neck pain 09/19/2013  . Right leg pain   . Diabetes ([place]) 09/18/2013    Past Surgical History:  Procedure Laterality Date  . CESAREAN SECTION    . CHOLECYSTECTOMY    . LAPAROSCOPY N/A 08/24/2013   Procedure: LAPAROSCOPY OPERATIVE with lysis of adhesions ;  Surgeon: Marylynn Pearson, MD;  Location: [facility];  Service: Gynecology;  Laterality: N/A;  . wrist, cyst removal      Prior to Admission medications   Medication Sig Start Date End Date Taking? Authorizing Provider  glimepiride (AMARYL) 4 MG tablet Take 1 tablet (4 mg total) by mouth daily before breakfast. 01/29/14   Renato Shin, MD  glucose blood (ONE TOUCH ULTRA TEST) test strip 1 each by Other route daily. And lancets 1/day 250.00 09/18/13   Renato Shin, MD  ibuprofen (ADVIL,MOTRIN) 800 MG tablet Take 1 tablet (800 mg total) by mouth every 8 (eight) hours as needed. 10/02/16   Diona Fanti, CNM  insulin detemir (LEVEMIR) 100 UNIT/ML injection Use up to  50 units per day, as per MD instructions 09/15/16   [provider]  Lancets MISC Dispense 100 lancets, ok to sub any brand preferred by insurance/patient, use up to 3x/day, dx diabetes 09/15/16   [provider]  metFORMIN (GLUCOPHAGE) 1000 MG tablet Take 1,000 mg by mouth.    [provider]  [place] by Does not apply route.    [provider]  simvastatin (ZOCOR) 20 MG tablet Take 20 mg by mouth.    [provider]    Allergies  Allergen Reactions  . Sulfamethoxazole-Trimethoprim Swelling  . Bactrim [Sulfamethoxazole-Trimethoprim] Swelling  . Neurontin [Gabapentin] Itching  . Other Swelling    Unknown antibiotic    Family History  Problem Relation Age of Onset  . Aneurysm Mother   . Blindness Father   . High blood pressure Father   . Diabetes Neg Hx     Social History Social History   Tobacco Use  . Smoking status: Never Smoker  . Smokeless tobacco: Never Used  Substance Use Topics  . Alcohol use: No    Alcohol/week: 1.0 standard drinks    Types: 1 Standard drinks or equivalent per week  . Drug use: No    Review of Systems Constitutional: Negative for fever. ENT: Negative for recent illness/congestion Cardiovascular: Negative for chest pain. Respiratory: Negative for shortness of breath.  Negative for cough. Gastrointestinal: Negative for abdominal pain, vomiting and  diarrhea. Musculoskeletal: Negative for musculoskeletal complaints Skin: Negative for skin complaints  Neurological: Negative for headache All other ROS negative  ____________________________________________   PHYSICAL EXAM:  VITAL SIGNS: ED Triage Vitals  Enc Vitals Group     BP 03/19/18 0424 140/85     Pulse Rate 03/19/18 0424 (!) 102     Resp 03/19/18 0424 18     Temp 03/19/18 0424 98.6 F (37 C)     Temp Source 03/19/18 0424 Oral     SpO2 03/19/18 0424 95 %     Weight 03/19/18 0419 150 lb (68 kg)     Height 03/19/18  0419 5\' 4"  (1.626 m)     Head Circumference --      Peak Flow --      Pain Score 03/19/18 0419 0     Pain Loc --      Pain Edu? --      Excl. in [place]? --    Constitutional: Alert and oriented. Well appearing and in no distress. Eyes: Normal exam ENT   Head: Normocephalic and atraumatic.   Nose: No congestion/rhinnorhea.   Mouth/Throat: Mucous membranes are moist.  Normal oropharynx. Cardiovascular: Normal rate, regular rhythm. No murmur Respiratory: Normal respiratory effort without tachypnea nor retractions. Breath sounds are clear and equal bilaterally. No wheezes/rales/rhonchi.  Gastrointestinal: Soft and nontender. No distention. Musculoskeletal: Nontender with normal range of motion in all extremities. No lower extremity tenderness or edema. Neurologic:  Normal speech and language. No gross focal neurologic deficits  Skin:  Skin is warm, dry and intact.  Psychiatric: Mood and affect are normal.    INITIAL IMPRESSION / ASSESSMENT AND PLAN / ED COURSE  Pertinent labs & imaging results that were available during my care of the patient were reviewed by me and considered in my medical decision making (see chart for details).  Patient presents to the emergency department with concerns over developing upper respiratory infection.  However during my evaluation the patient appears extremely well, reassuring vitals including afebrile.  Clear lung sounds.  Patient denies any cough or congestion.  Normal-appearing oropharynx, states it felt like her throat was going to get sore today but has not done so yet.  Does admit that she has been very concerned about coronavirus.  At this time however the patient is not exhibiting any symptoms suggestive of coronavirus or influenza.  I discussed with the patient obtaining plenty of rest, drinking plenty of fluids and using over-the-counter medication such as Tylenol or ibuprofen if needed.  Patient agreeable to plan of  care.  ____________________________________________   FINAL CLINICAL IMPRESSION(S) / ED DIAGNOSES  fatigue   Harvest Dark, MD 03/19/18 [phone]

## 2018-03-19 NOTE — ED Notes (Signed)
Pt reports she felt off , due to the fact that  when she woke up she felt as if she may still be dreaming. Pt reports no other S/S at this time .

## 2018-03-19 NOTE — ED Triage Notes (Signed)
Patient woke up around 0330 and "felt like the flu was coming on." Has no specific complaint and denies fever, chills, nausea, vomiting, diarrhea, headache, body aches; No complaints. Patient wants to be "checked for the flu".

## 2018-03-19 NOTE — ED Notes (Signed)
Report received 

## 2018-03-28 ENCOUNTER — Telehealth: Payer: Self-pay

## 2018-03-28 NOTE — Telephone Encounter (Signed)
Attempted x 2 to contact pt- line is busy. No alternate phone number available to reschedule annual pe on 04/01/18

## 2018-04-01 ENCOUNTER — Encounter: Payer: BLUE CROSS/BLUE SHIELD | Admitting: Certified Nurse Midwife

## 2018-05-19 ENCOUNTER — Other Ambulatory Visit: Payer: Self-pay

## 2018-05-19 ENCOUNTER — Emergency Department: Payer: BLUE CROSS/BLUE SHIELD

## 2018-05-19 ENCOUNTER — Emergency Department
Admission: EM | Admit: 2018-05-19 | Discharge: 2018-05-19 | Disposition: A | Discharge status: Home / Self Care | Payer: BLUE CROSS/BLUE SHIELD | Attending: Emergency Medicine | Admitting: Emergency Medicine

## 2018-05-19 ENCOUNTER — Encounter: Payer: Self-pay | Admitting: Emergency Medicine

## 2018-05-19 DIAGNOSIS — Z794 Long term (current) use of insulin: Secondary | ICD-10-CM | POA: Insufficient documentation

## 2018-05-19 DIAGNOSIS — R079 Chest pain, unspecified: Secondary | ICD-10-CM

## 2018-05-19 DIAGNOSIS — E119 Type 2 diabetes mellitus without complications: Secondary | ICD-10-CM | POA: Insufficient documentation

## 2018-05-19 DIAGNOSIS — F419 Anxiety disorder, unspecified: Secondary | ICD-10-CM | POA: Diagnosis not present

## 2018-05-19 DIAGNOSIS — Z79899 Other long term (current) drug therapy: Secondary | ICD-10-CM | POA: Insufficient documentation

## 2018-05-19 DIAGNOSIS — R0789 Other chest pain: Secondary | ICD-10-CM | POA: Insufficient documentation

## 2018-05-19 LAB — COMPREHENSIVE METABOLIC PANEL
ALT: 18 U/L (ref 0–44)
AST: 20 U/L (ref 15–41)
Albumin: 3.9 g/dL (ref 3.5–5.0)
Alkaline Phosphatase: 51 U/L (ref 38–126)
Anion gap: 9 (ref 5–15)
BUN: 7 mg/dL (ref 6–20)
CO2: 25 mmol/L (ref 22–32)
Calcium: 9.1 mg/dL (ref 8.9–10.3)
Chloride: 103 mmol/L (ref 98–111)
Creatinine, Ser: 0.8 mg/dL (ref 0.44–1.00)
GFR calc Af Amer: >60 mL/min (ref >60)
GFR calc non Af Amer: >60 mL/min (ref >60)
Glucose, Bld: 291 mg/dL — ABNORMAL HIGH (ref 70–99)
Potassium: 3.9 mmol/L (ref 3.5–5.1)
Sodium: 137 mmol/L (ref 135–145)
Total Bilirubin: 0.4 mg/dL (ref 0.3–1.2)
Total Protein: 7.7 g/dL (ref 6.5–8.1)

## 2018-05-19 LAB — CBC WITH DIFFERENTIAL/PLATELET
Abs Immature Granulocytes: 0.01 10*3/uL (ref 0.00–0.07)
Basophils Absolute: 0.1 10*3/uL (ref 0.0–0.1)
Basophils Relative: 1 %
Eosinophils Absolute: 0.1 10*3/uL (ref 0.0–0.5)
Eosinophils Relative: 1 %
HCT: 38.3 % (ref 36.0–46.0)
Hemoglobin: 12.3 g/dL (ref 12.0–15.0)
Immature Granulocytes: 0 %
Lymphocytes Relative: 26 %
Lymphs Abs: 2.2 10*3/uL (ref 0.7–4.0)
MCH: 25.3 pg — ABNORMAL LOW (ref 26.0–34.0)
MCHC: 32.1 g/dL (ref 30.0–36.0)
MCV: 78.6 fL — ABNORMAL LOW (ref 80.0–100.0)
Monocytes Absolute: 0.5 10*3/uL (ref 0.1–1.0)
Monocytes Relative: 6 %
Neutro Abs: 5.8 10*3/uL (ref 1.7–7.7)
Neutrophils Relative %: 66 %
Platelets: 311 10*3/uL (ref 150–400)
RBC: 4.87 MIL/uL (ref 3.87–5.11)
RDW: 14.2 % (ref 11.5–15.5)
WBC: 8.7 10*3/uL (ref 4.0–10.5)
nRBC: 0 % (ref 0.0–0.2)

## 2018-05-19 LAB — TROPONIN I
Troponin I: <0.03 ng/mL (ref <0.03)
Troponin I: <0.03 ng/mL (ref <0.03)

## 2018-05-19 MED ORDER — HYDROXYZINE HCL 25 MG PO TABS: 25.0000 mg | ORAL_TABLET | Freq: Three times a day (TID) | ORAL | 0 refills | Status: DC | PRN

## 2018-05-19 MED ORDER — HYDROXYZINE HCL 25 MG PO TABS
25.0000 mg | ORAL_TABLET | Freq: Three times a day (TID) | ORAL | Status: DC | PRN
  Administered 2018-05-19: 08:00:00 25 mg via ORAL
  Filled 2018-05-19: qty 1

## 2018-05-19 NOTE — ED Triage Notes (Signed)
Patient ambulatory to triage with steady gait, without difficulty or distress noted; pt reports right sided CP,nonradiating; denies hx of same but st she has anxiety

## 2018-05-19 NOTE — ED Notes (Signed)
Pt ambulatory to room 9 to be placed on card monitor for EKG & further eval by EDT Judson Roch; report called to care nurse Dorothyann Gibbs, RN

## 2018-05-19 NOTE — Discharge Instructions (Signed)
The heart blood work chest x-ray and EKG that we did today look okay.  I want you to follow-up with Dr. Clayborn Bigness the cardiologist on call just to double check everything.  Give his office a call today he should be out of see you later today or tomorrow.  Let him know you were in the emergency room with chest pain.  Take the Atarax 1 pill 3 times a day to see if it helps with your anxiety.  Please return for any worsening of your symptoms.

## 2018-05-19 NOTE — ED Notes (Signed)
Pt sitting in bed speaking with this RN, A&Ox4, no SOB noted, no signs of acute distress

## 2018-05-19 NOTE — ED Provider Notes (Addendum)
[facility] Emergency Department Provider Note   ____________________________________________   First MD Initiated Contact with Patient 05/19/18 [id number]     (approximate)  I have reviewed the triage vital signs and the nursing notes.   HISTORY  Chief Complaint Chest Pain    HPI Rose [name] is a 43 y.o. female who is complaining of right-sided chest pain and pressure constant for about 3 days.  On pressing her little bit more she says it goes away when she sleeps and waxes and wanes during the day but is never really completely gone.  Not worse with exercise or deep breathing.  She has no sweating with that or nausea with it.  Kind of a dull achy pressure pain.  Is not reproduced with palpation.  Patient reports she has a history of anxiety and has been bad for several days as well.  She is to get a medicine from a doctor in [place] but she has been back to see him because he wanted to reevaluate her.         Past Medical History:  Diagnosis Date  . Anxiety   . Diabetes mellitus without complication ([place])   . Neck pain   . Right leg pain     Patient Active Problem List   Diagnosis Date Noted  . Family history of high cholesterol 03/31/2017  . Family history of hypertension 03/31/2017  . IUD (intrauterine device) in place 03/31/2017  . Neck pain 09/19/2013  . Right leg pain   . Diabetes ([place]) 09/18/2013    Past Surgical History:  Procedure Laterality Date  . CESAREAN SECTION    . CHOLECYSTECTOMY    . LAPAROSCOPY N/A 08/24/2013   Procedure: LAPAROSCOPY OPERATIVE with lysis of adhesions ;  Surgeon: Marylynn Pearson, MD;  Location: [place] [facility] [facility];  Service: Gynecology;  Laterality: N/A;  . wrist, cyst removal      Prior to Admission medications   Medication Sig Start Date End Date Taking? Authorizing Provider  Ascorbic Acid (VITAMIN C) 1000 MG tablet Take 1,000 mg by mouth 2 (two) times daily.   Yes [provider]  ferrous sulfate 325  (65 FE) MG tablet Take 325 mg by mouth daily with breakfast.   Yes [provider]  glucose blood (ONE TOUCH ULTRA TEST) test strip 1 each by Other route daily. And lancets 1/day 250.00 09/18/13  Yes Renato Shin, MD  insulin detemir (LEVEMIR) 100 UNIT/ML injection Inject 40 Units into the skin daily.  09/15/16  Yes [provider]  metFORMIN (GLUCOPHAGE) 1000 MG tablet Take 1,000 mg by mouth 2 (two) times daily.    Yes [provider]  Pyridoxine HCl (VITAMIN B-6) 250 MG tablet Take 250 mg by mouth daily.   Yes [provider]  simvastatin (ZOCOR) 20 MG tablet Take 20 mg by mouth daily at 6 PM.    Yes [provider]  vitamin B-12 (CYANOCOBALAMIN) 250 MCG tablet Take 250 mcg by mouth daily.   Yes [provider]  Vitamin D, Cholecalciferol, 50 MCG (2000 UT) CAPS Take 2,000 Units by mouth daily.   Yes [provider]  hydrOXYzine (ATARAX/VISTARIL) 25 MG tablet Take 1 tablet (25 mg total) by mouth 3 (three) times daily as needed. 05/19/18   Nena Polio, MD    Allergies Sulfamethoxazole-trimethoprim; Bactrim [sulfamethoxazole-trimethoprim]; Neurontin [gabapentin]; and Other  Family History  Problem Relation Age of Onset  . Aneurysm Mother   . Blindness Father   . High blood pressure  Father   . Diabetes Neg Hx     Social History Social History   Tobacco Use  . Smoking status: Never Smoker  . Smokeless tobacco: Never Used  Substance Use Topics  . Alcohol use: No    Alcohol/week: 1.0 standard drinks    Types: 1 Standard drinks or equivalent per week  . Drug use: No    Review of Systems  Constitutional: No fever/chills Eyes: No visual changes. ENT: No sore throat. Cardiovascular:  chest pain. Respiratory: Denies shortness of breath. Gastrointestinal: No abdominal pain.  No nausea, no vomiting.  No diarrhea.  No constipation. Genitourinary: Negative for dysuria. Musculoskeletal: Negative for back pain. Skin:  Negative for rash. Neurological: Negative for headaches, focal weakness   ____________________________________________   PHYSICAL EXAM:  VITAL SIGNS: ED Triage Vitals  Enc Vitals Group     BP 05/19/18 0655 127/90     Pulse Rate 05/19/18 0655 (!) 107     Resp 05/19/18 0655 16     Temp 05/19/18 0655 98.3 F (36.8 C)     Temp Source 05/19/18 0655 Oral     SpO2 05/19/18 0655 99 %     Weight 05/19/18 0644 160 lb (72.6 kg)     Height 05/19/18 0644 5\' 4"  (1.626 m)     Head Circumference --      Peak Flow --      Pain Score 05/19/18 0644 6     Pain Loc --      Pain Edu? --      Excl. in [place]? --     Constitutional: Alert and oriented. Well appearing and in no acute distress. Eyes: Conjunctivae are normal.  Head: Atraumatic. Nose: No congestion/rhinnorhea. Mouth/Throat: Mucous membranes are moist.  Oropharynx non-erythematous. Neck: No stridor. Cardiovascular: Normal rate, regular rhythm. Grossly normal heart sounds.  Good peripheral circulation. Respiratory: Normal respiratory effort.  No retractions. Lungs CTAB. Gastrointestinal: Soft and nontender. No distention. No abdominal bruits. No CVA tenderness. Musculoskeletal: No lower extremity tenderness nor edema. Neurologic:  Normal speech and language. No gross focal neurologic deficits are appreciated.  Skin:  Skin is warm, dry and intact. No rash noted. .  ____________________________________________   LABS (all labs ordered are listed, but only abnormal results are displayed)  Labs Reviewed  COMPREHENSIVE METABOLIC PANEL - Abnormal; Notable for the following components:      Result Value   Glucose, Bld 291 (*)    All other components within normal limits  CBC WITH DIFFERENTIAL/PLATELET - Abnormal; Notable for the following components:   MCV 78.6 (*)    MCH 25.3 (*)    All other components within normal limits  TROPONIN I  TROPONIN I   ____________________________________________  EKG  EKG read interpreted by me  shows normal sinus rhythm rate of 98 normal axis essentially normal EKG ____________________________________________  RADIOLOGY  ED MD interpretation:  Official radiology report(s): Dg Chest Portable 1 View  Result Date: 05/19/2018 CLINICAL DATA:  Chest pain and pressure for 2 days EXAM: PORTABLE CHEST 1 VIEW COMPARISON:  09/15/2017 FINDINGS: The heart size and mediastinal contours are within normal limits. Both lungs are clear. The visualized skeletal structures are unremarkable. IMPRESSION: No active disease. Electronically Signed   By: Inez Catalina M.D.   On: 05/19/2018 08:00    ____________________________________________   PROCEDURES  Procedure(s) performed (including Critical Care):  Procedures   ____________________________________________   INITIAL IMPRESSION / ASSESSMENT AND PLAN / ED COURSE   EKG chest x-ray and troponins are okay.  Pain is atypical.  I will have her follow-up with cardiology just in case.  I will discharge her now so she can get the follow-up.    I discussed the patient with Dr. Clayborn Bigness, he wants to see her in a week.  He will get her an appointment when she calls.  Patient is okay with this.  She feels better after the Atarax.Rose [name] was evaluated in Emergency Department on 05/19/2018 for the symptoms described in the history of present illness. She was evaluated in the context of the global COVID-19 pandemic, which necessitated consideration that the patient might be at risk for infection with the SARS-CoV-2 virus that causes COVID-19. Institutional protocols and algorithms that pertain to the evaluation of patients at risk for COVID-19 are in a state of rapid change based on information released by regulatory bodies including the CDC and federal and state organizations. These policies and algorithms were followed during the patient's care in the ED.         ____________________________________________   FINAL CLINICAL IMPRESSION(S) /  ED DIAGNOSES  Final diagnoses:  Chest pain, unspecified type  Anxiety     ED Discharge Orders         Ordered    hydrOXYzine (ATARAX/VISTARIL) 25 MG tablet  3 times daily PRN     05/19/18 0948           Note:  This document was prepared using Dragon voice recognition software and may include unintentional dictation errors.    Nena Polio, MD 05/19/18 1610    Nena Polio, MD 05/19/18 1001

## 2018-05-24 ENCOUNTER — Encounter: Payer: Self-pay | Admitting: Emergency Medicine

## 2018-05-24 ENCOUNTER — Emergency Department
Admission: EM | Admit: 2018-05-24 | Discharge: 2018-05-24 | Disposition: A | Discharge status: Home / Self Care | Payer: BLUE CROSS/BLUE SHIELD | Attending: Emergency Medicine | Admitting: Emergency Medicine

## 2018-05-24 ENCOUNTER — Other Ambulatory Visit: Payer: Self-pay

## 2018-05-24 DIAGNOSIS — Z7984 Long term (current) use of oral hypoglycemic drugs: Secondary | ICD-10-CM | POA: Diagnosis not present

## 2018-05-24 DIAGNOSIS — E119 Type 2 diabetes mellitus without complications: Secondary | ICD-10-CM | POA: Diagnosis not present

## 2018-05-24 DIAGNOSIS — F419 Anxiety disorder, unspecified: Secondary | ICD-10-CM | POA: Insufficient documentation

## 2018-05-24 DIAGNOSIS — R079 Chest pain, unspecified: Secondary | ICD-10-CM | POA: Insufficient documentation

## 2018-05-24 LAB — CBC
HCT: 36.2 % (ref 36.0–46.0)
Hemoglobin: 11.8 g/dL — ABNORMAL LOW (ref 12.0–15.0)
MCH: 25.3 pg — ABNORMAL LOW (ref 26.0–34.0)
MCHC: 32.6 g/dL (ref 30.0–36.0)
MCV: 77.7 fL — ABNORMAL LOW (ref 80.0–100.0)
Platelets: 316 10*3/uL (ref 150–400)
RBC: 4.66 MIL/uL (ref 3.87–5.11)
RDW: 14 % (ref 11.5–15.5)
WBC: 11.5 10*3/uL — ABNORMAL HIGH (ref 4.0–10.5)
nRBC: 0 % (ref 0.0–0.2)

## 2018-05-24 LAB — COMPREHENSIVE METABOLIC PANEL
ALT: 14 U/L (ref 0–44)
AST: 17 U/L (ref 15–41)
Albumin: 3.9 g/dL (ref 3.5–5.0)
Alkaline Phosphatase: 53 U/L (ref 38–126)
Anion gap: 12 (ref 5–15)
BUN: 10 mg/dL (ref 6–20)
CO2: 26 mmol/L (ref 22–32)
Calcium: 8.9 mg/dL (ref 8.9–10.3)
Chloride: 101 mmol/L (ref 98–111)
Creatinine, Ser: 0.65 mg/dL (ref 0.44–1.00)
GFR calc Af Amer: >60 mL/min (ref >60)
GFR calc non Af Amer: >60 mL/min (ref >60)
Glucose, Bld: 123 mg/dL — ABNORMAL HIGH (ref 70–99)
Potassium: 3.4 mmol/L — ABNORMAL LOW (ref 3.5–5.1)
Sodium: 139 mmol/L (ref 135–145)
Total Bilirubin: 0.5 mg/dL (ref 0.3–1.2)
Total Protein: 7.4 g/dL (ref 6.5–8.1)

## 2018-05-24 LAB — LIPASE, BLOOD: Lipase: 37 U/L (ref 11–51)

## 2018-05-24 LAB — TROPONIN I: Troponin I: <0.03 ng/mL (ref <0.03)

## 2018-05-24 LAB — FIBRIN DERIVATIVES D-DIMER (ARMC ONLY): Fibrin derivatives D-dimer (ARMC): 184.06 ng/mL (FEU) (ref 0.00–499.00)

## 2018-05-24 NOTE — ED Notes (Signed)
Patient informed that the doctor is composing her discharge paperwork.

## 2018-05-24 NOTE — ED Triage Notes (Signed)
Patient to ER for c/o right sided chest pain. States she was seen on 5/14 for anxiety, but states this doesn't feel like anxiety to her. Patient reports she at times feels like it's difficult to catch her breath. Patient denies any heavy lifting/pulling, denies any cough or fever. Describes pain as a burning sensation.

## 2018-05-24 NOTE — ED Notes (Signed)
MD at the bedside for pt evaluation  

## 2018-05-24 NOTE — ED Provider Notes (Signed)
[facility] Emergency Department Provider Note  Time seen: 8:33 PM  I have reviewed the triage vital signs and the nursing notes.   HISTORY  Chief Complaint Chest Pain   HPI Rose [name] is a 43 y.o. female a past medical history of anxiety, diabetes, presents emergency department for right-sided chest discomfort.  According to the patient over the past 7 days she has been experiencing right-sided chest discomfort.  States she feels somewhat short of breath as well.  Describes as a dull pulling sensation in the right chest.  Denies any worse with deep breathing.  Denies any cough.  Denies any fever.  Patient was seen for similar complaints 05/19/2018, had a negative work-up at that time and was discharged home with possible anxiety.  Patient states she tried taking her medicine for anxiety but it did not help and she was concerned so she came back to the emergency department for repeat evaluation.    Past Medical History:  Diagnosis Date  . Anxiety   . Diabetes mellitus without complication ([place])   . Neck pain   . Right leg pain     Patient Active Problem List   Diagnosis Date Noted  . Family history of high cholesterol 03/31/2017  . Family history of hypertension 03/31/2017  . IUD (intrauterine device) in place 03/31/2017  . Neck pain 09/19/2013  . Right leg pain   . Diabetes ([place]) 09/18/2013    Past Surgical History:  Procedure Laterality Date  . CESAREAN SECTION    . CHOLECYSTECTOMY    . LAPAROSCOPY N/A 08/24/2013   Procedure: LAPAROSCOPY OPERATIVE with lysis of adhesions ;  Surgeon: Marylynn Pearson, MD;  Location: [place] [facility];  Service: Gynecology;  Laterality: N/A;  . wrist, cyst removal      Prior to Admission medications   Medication Sig Start Date End Date Taking? Authorizing Provider  Ascorbic Acid (VITAMIN C) 1000 MG tablet Take 1,000 mg by mouth 2 (two) times daily.    [provider]  ferrous sulfate 325 (65 FE) MG tablet Take  325 mg by mouth daily with breakfast.    [provider]  glucose blood (ONE TOUCH ULTRA TEST) test strip 1 each by Other route daily. And lancets 1/day 250.00 09/18/13   Renato Shin, MD  hydrOXYzine (ATARAX/VISTARIL) 25 MG tablet Take 1 tablet (25 mg total) by mouth 3 (three) times daily as needed. 05/19/18   Nena Polio, MD  insulin detemir (LEVEMIR) 100 UNIT/ML injection Inject 40 Units into the skin daily.  09/15/16   [provider]  metFORMIN (GLUCOPHAGE) 1000 MG tablet Take 1,000 mg by mouth 2 (two) times daily.     [provider]  Pyridoxine HCl (VITAMIN B-6) 250 MG tablet Take 250 mg by mouth daily.    [provider]  simvastatin (ZOCOR) 20 MG tablet Take 20 mg by mouth daily at 6 PM.     [provider]  vitamin B-12 (CYANOCOBALAMIN) 250 MCG tablet Take 250 mcg by mouth daily.    [provider]  Vitamin D, Cholecalciferol, 50 MCG (2000 UT) CAPS Take 2,000 Units by mouth daily.    [provider]    Allergies  Allergen Reactions  . Sulfamethoxazole-Trimethoprim Swelling  . Bactrim [Sulfamethoxazole-Trimethoprim] Swelling  . Neurontin [Gabapentin] Itching  . Other Swelling    Unknown antibiotic    Family History  Problem Relation Age of Onset  . Aneurysm Mother   . Blindness Father   . High blood  pressure Father   . Diabetes Neg Hx     Social History Social History   Tobacco Use  . Smoking status: Never Smoker  . Smokeless tobacco: Never Used  Substance Use Topics  . Alcohol use: No    Alcohol/week: 1.0 standard drinks    Types: 1 Standard drinks or equivalent per week  . Drug use: No    Review of Systems Constitutional: Negative for fever. Cardiovascular: Right-sided chest pain x1 week Respiratory: Mild intermittent shortness of breath.  Negative for cough. Gastrointestinal: Negative for abdominal pain, vomiting Musculoskeletal: Negative for musculoskeletal complaints Skin: Negative for skin  complaints  Neurological: Negative for headache All other ROS negative  ____________________________________________   PHYSICAL EXAM:  VITAL SIGNS: ED Triage Vitals  Enc Vitals Group     BP 05/24/18 2003 131/83     Pulse Rate 05/24/18 2003 (!) 106     Resp 05/24/18 2003 20     Temp 05/24/18 2003 98.2 F (36.8 C)     Temp Source 05/24/18 2003 Oral     SpO2 05/24/18 2003 99 %     Weight 05/24/18 2000 160 lb 0.9 oz (72.6 kg)     Height 05/24/18 2000 5\' 4"  (1.626 m)     Head Circumference --      Peak Flow --      Pain Score 05/24/18 2000 8     Pain Loc --      Pain Edu? --      Excl. in [place]? --    Constitutional: Alert and oriented. Well appearing and in no distress. Eyes: Normal exam ENT      Head: Normocephalic and atraumatic.      Mouth/Throat: Mucous membranes are moist. Cardiovascular: Normal rate, regular rhythm.  Respiratory: Normal respiratory effort without tachypnea nor retractions. Breath sounds are clear Gastrointestinal: Soft and nontender. No distention.  Musculoskeletal: Nontender with normal range of motion in all extremities.  Neurologic:  Normal speech and language. No gross focal neurologic deficits Skin:  Skin is warm, dry and intact.  Psychiatric: Mood and affect are normal.   ____________________________________________    EKG  EKG viewed and interpreted by myself shows sinus tachycardia 112 bpm with a narrow QRS, normal axis, normal intervals, no concerning ST changes.  ____________________________________________    INITIAL IMPRESSION / ASSESSMENT AND PLAN / ED COURSE  Pertinent labs & imaging results that were available during my care of the patient were reviewed by me and considered in my medical decision making (see chart for details).   Patient presents to the emergency department for right-sided chest pain.  Patient states symptoms have been ongoing x1 week, did not improve with her anxiety medication so she presented to the emergency  department.  Overall the patient appears extremely well, slight tachycardia otherwise reassuring vitals.  Very minimally anxious appearing.  No leg pain or swelling.  No abdominal discomfort.  Chest wall is nontender to palpation.  Differential this time would include ACS, angina, pneumonia, PE, anxiety.  Overall the patient appears well, we will check labs including cardiac enzymes.  Patient had a chest x-ray performed 5 days ago that was normal.  We will hold off on repeating the x-ray at this time.  I will also send a d-dimer to help rule out PE although very low clinical suspicion.  Patient agreeable to plan of care.  Patient's work-up is negative.  Troponin negative.  D-dimer negative.  Overall the patient appears extremely well.  I discussed supportive care at  home including plenty of rest, getting plenty of fluids and following up with her doctor.  Provided my normal chest pain return precautions.  Patient will be discharged from the emergency department.  Rose [name] was evaluated in Emergency Department on 05/24/2018 for the symptoms described in the history of present illness. She was evaluated in the context of the global COVID-19 pandemic, which necessitated consideration that the patient might be at risk for infection with the SARS-CoV-2 virus that causes COVID-19. Institutional protocols and algorithms that pertain to the evaluation of patients at risk for COVID-19 are in a state of rapid change based on information released by regulatory bodies including the CDC and federal and state organizations. These policies and algorithms were followed during the patient's care in the ED.  ____________________________________________   FINAL CLINICAL IMPRESSION(S) / ED DIAGNOSES  Chest pain   Harvest Dark, MD 05/24/18 2250

## 2018-05-27 ENCOUNTER — Encounter: Payer: BLUE CROSS/BLUE SHIELD | Admitting: Certified Nurse Midwife

## 2018-06-22 ENCOUNTER — Telehealth: Payer: Self-pay

## 2018-06-22 NOTE — Telephone Encounter (Signed)
Coronavirus (COVID-19) Are you at risk?  Are you at risk for the Coronavirus (COVID-19)?  To be considered HIGH RISK for Coronavirus (COVID-19), you have to meet the following criteria:  . Traveled to [place], [place], [place], [place] or [place]; or in the [place] to [place], [place], [place], or [place]; and have fever, cough, and shortness of breath within the last 2 weeks of travel OR . Been in close contact with a person diagnosed with COVID-19 within the last 2 weeks and have fever, cough, and shortness of breath . IF YOU DO NOT MEET THESE CRITERIA, YOU ARE CONSIDERED LOW RISK FOR COVID-19.  What to do if you are HIGH RISK for COVID-19?  Marland Kitchen If you are having a medical emergency, call 911. . Seek medical care right away. Before you go to a doctor's office, urgent care or emergency department, call ahead and tell them about your recent travel, contact with someone diagnosed with COVID-19, and your symptoms. You should receive instructions from your physician's office regarding next steps of care.  . When you arrive at healthcare provider, tell the healthcare staff immediately you have returned from visiting [place], [place], [place], [place] or [place]; or traveled in the [place] to [place], [place], [place], or [place]; in the last two weeks or you have been in close contact with a person diagnosed with COVID-19 in the last 2 weeks.   . Tell the health care staff about your symptoms: fever, cough and shortness of breath. . After you have been seen by a medical provider, you will be either: o Tested for (COVID-19) and discharged home on quarantine except to seek medical care if symptoms worsen, and asked to  - Stay home and avoid contact with others until you get your results (4-5 days)  - Avoid travel on public transportation if possible (such as bus, train, or airplane) or o Sent to the Emergency Department by EMS for evaluation, COVID-19 testing, and possible  admission depending on your condition and test results.  What to do if you are LOW RISK for COVID-19?  Reduce your risk of any infection by using the same precautions used for avoiding the common cold or flu:  Marland Kitchen Wash your hands often with soap and warm water for at least 20 seconds.  If soap and water are not readily available, use an alcohol-based hand sanitizer with at least 60% alcohol.  . If coughing or sneezing, cover your mouth and nose by coughing or sneezing into the elbow areas of your shirt or coat, into a tissue or into your sleeve (not your hands). . Avoid shaking hands with others and consider head nods or verbal greetings only. . Avoid touching your eyes, nose, or mouth with unwashed hands.  . Avoid close contact with people who are [place] [place]. . Avoid places or events with large numbers of people in one location, like concerts or sporting events. . Carefully consider travel plans you have or are making. . If you are planning any travel outside or inside the [place], visit the CDC's Travelers' Health webpage for the latest health notices. . If you have some symptoms but not all symptoms, continue to monitor at home and seek medical attention if your symptoms worsen. . If you are having a medical emergency, call 911.  06/22/18 SCREENING NEG SLS ADDITIONAL HEALTHCARE OPTIONS FOR PATIENTS  [facility] / e-Visit: [email or web address]         [facility]: [phone]  [facility]: [phone]                   [facility]: [phone]  

## 2018-06-23 ENCOUNTER — Other Ambulatory Visit: Payer: Self-pay

## 2018-06-23 ENCOUNTER — Encounter: Payer: Self-pay | Admitting: Certified Nurse Midwife

## 2018-06-23 ENCOUNTER — Ambulatory Visit (INDEPENDENT_AMBULATORY_CARE_PROVIDER_SITE_OTHER): Payer: BC Managed Care – PPO | Admitting: Certified Nurse Midwife

## 2018-06-23 ENCOUNTER — Other Ambulatory Visit (HOSPITAL_COMMUNITY)
Admission: RE | Admit: 2018-06-23 | Discharge: 2018-06-23 | Disposition: A | Discharge status: Home / Self Care | Payer: BC Managed Care – PPO | Source: Ambulatory Visit | Attending: Certified Nurse Midwife | Admitting: Certified Nurse Midwife

## 2018-06-23 VITALS — BP 125/86 | HR 100 | Ht 64.0 in | Wt 179.1 lb

## 2018-06-23 DIAGNOSIS — Z124 Encounter for screening for malignant neoplasm of cervix: Secondary | ICD-10-CM

## 2018-06-23 DIAGNOSIS — D252 Subserosal leiomyoma of uterus: Secondary | ICD-10-CM | POA: Diagnosis not present

## 2018-06-23 DIAGNOSIS — Z975 Presence of (intrauterine) contraceptive device: Secondary | ICD-10-CM | POA: Diagnosis not present

## 2018-06-23 DIAGNOSIS — Z113 Encounter for screening for infections with a predominantly sexual mode of transmission: Secondary | ICD-10-CM | POA: Insufficient documentation

## 2018-06-23 DIAGNOSIS — Z01419 Encounter for gynecological examination (general) (routine) without abnormal findings: Secondary | ICD-10-CM

## 2018-06-23 DIAGNOSIS — Z1231 Encounter for screening mammogram for malignant neoplasm of breast: Secondary | ICD-10-CM | POA: Diagnosis not present

## 2018-06-23 DIAGNOSIS — Z8619 Personal history of other infectious and parasitic diseases: Secondary | ICD-10-CM

## 2018-06-23 DIAGNOSIS — L818 Other specified disorders of pigmentation: Secondary | ICD-10-CM

## 2018-06-23 NOTE — Patient Instructions (Signed)
Levonorgestrel intrauterine device (IUD) What is this medicine? LEVONORGESTREL IUD (LEE voe nor jes trel) is a contraceptive (birth control) device. The device is placed inside the uterus by a healthcare professional. It is used to prevent pregnancy. This device can also be used to treat heavy bleeding that occurs during your period. This medicine may be used for other purposes; ask your health care provider or pharmacist if you have questions. COMMON BRAND NAME(S): Minette Headland What should I tell my health care provider before I take this medicine? They need to know if you have any of these conditions: -abnormal Pap smear -cancer of the breast, uterus, or cervix -diabetes -endometritis -genital or pelvic infection now or in the past -have more than one sexual partner or your partner has more than one partner -heart disease -history of an ectopic or tubal pregnancy -immune system problems -IUD in place -liver disease or tumor -problems with blood clots or take blood-thinners -seizures -use intravenous drugs -uterus of unusual shape -vaginal bleeding that has not been explained -an unusual or allergic reaction to levonorgestrel, other hormones, silicone, or polyethylene, medicines, foods, dyes, or preservatives -pregnant or trying to get pregnant -breast-feeding How should I use this medicine? This device is placed inside the uterus by a health care professional. Talk to your pediatrician regarding the use of this medicine in children. Special care may be needed. Overdosage: If you think you have taken too much of this medicine contact a poison control center or emergency room at once. NOTE: This medicine is only for you. Do not share this medicine with others. What if I miss a dose? This does not apply. Depending on the brand of device you have inserted, the device will need to be replaced every 3 to 5 years if you wish to continue using this type of birth control.  What may interact with this medicine? Do not take this medicine with any of the following medications: -amprenavir -bosentan -fosamprenavir This medicine may also interact with the following medications: -aprepitant -armodafinil -barbiturate medicines for inducing sleep or treating seizures -bexarotene -boceprevir -griseofulvin -medicines to treat seizures like carbamazepine, ethotoin, felbamate, oxcarbazepine, phenytoin, topiramate -modafinil -pioglitazone -rifabutin -rifampin -rifapentine -some medicines to treat HIV infection like atazanavir, efavirenz, indinavir, lopinavir, nelfinavir, tipranavir, ritonavir -St. John's wort -warfarin This list may not describe all possible interactions. Give your health care provider a list of all the medicines, herbs, non-prescription drugs, or dietary supplements you use. Also tell them if you smoke, drink alcohol, or use illegal drugs. Some items may interact with your medicine. What should I watch for while using this medicine? Visit your doctor or health care professional for regular check ups. See your doctor if you or your partner has sexual contact with others, becomes HIV positive, or gets a sexual transmitted disease. This product does not protect you against HIV infection (AIDS) or other sexually transmitted diseases. You can check the placement of the IUD yourself by reaching up to the top of your vagina with clean fingers to feel the threads. Do not pull on the threads. It is a good habit to check placement after each menstrual period. Call your doctor right away if you feel more of the IUD than just the threads or if you cannot feel the threads at all. The IUD may come out by itself. You may become pregnant if the device comes out. If you notice that the IUD has come out use a backup birth control method like condoms and call your  health care provider. Using tampons will not change the position of the IUD and are okay to use during your  period. This IUD can be safely scanned with magnetic resonance imaging (MRI) only under specific conditions. Before you have an MRI, tell your healthcare provider that you have an IUD in place, and which type of IUD you have in place. What side effects may I notice from receiving this medicine? Side effects that you should report to your doctor or health care professional as soon as possible: -allergic reactions like skin rash, itching or hives, swelling of the face, lips, or tongue -fever, flu-like symptoms -genital sores -high blood pressure -no menstrual period for 6 weeks during use -pain, swelling, warmth in the leg -pelvic pain or tenderness -severe or sudden headache -signs of pregnancy -stomach cramping -sudden shortness of breath -trouble with balance, talking, or walking -unusual vaginal bleeding, discharge -yellowing of the eyes or skin Side effects that usually do not require medical attention (report to your doctor or health care professional if they continue or are bothersome): -acne -breast pain -change in sex drive or performance -changes in weight -cramping, dizziness, or faintness while the device is being inserted -headache -irregular menstrual bleeding within first 3 to 6 months of use -nausea This list may not describe all possible side effects. Call your doctor for medical advice about side effects. You may report side effects to FDA at 1-800-FDA-1088. Where should I keep my medicine? This does not apply. NOTE: This sheet is a summary. It may not cover all possible information. If you have questions about this medicine, talk to your doctor, pharmacist, or health care provider.  2019 Elsevier/Gold Standard (2015-10-04 14:14:56) Preventive Care 40-64 Years, Female Preventive care refers to lifestyle choices and visits with your health care provider that can promote health and wellness. What does preventive care include?   A yearly physical exam. This is also  called an annual well check.  Dental exams once or twice a year.  Routine eye exams. Ask your health care provider how often you should have your eyes checked.  Personal lifestyle choices, including: ? Daily care of your teeth and gums. ? Regular physical activity. ? Eating a healthy diet. ? Avoiding tobacco and drug use. ? Limiting alcohol use. ? Practicing safe sex. ? Taking low-dose aspirin daily starting at age 75. ? Taking vitamin and mineral supplements as recommended by your health care provider. What happens during an annual well check? The services and screenings done by your health care provider during your annual well check will depend on your age, overall health, lifestyle risk factors, and family history of disease. Counseling Your health care provider may ask you questions about your:  Alcohol use.  Tobacco use.  Drug use.  Emotional well-being.  Home and relationship well-being.  Sexual activity.  Eating habits.  Work and work Statistician.  Method of birth control.  Menstrual cycle.  Pregnancy history. Screening You may have the following tests or measurements:  Height, weight, and BMI.  Blood pressure.  Lipid and cholesterol levels. These may be checked every 5 years, or more frequently if you are over 35 years old.  Skin check.  Lung cancer screening. You may have this screening every year starting at age 44 if you have a 30-pack-year history of smoking and currently smoke or have quit within the past 15 years.  Colorectal cancer screening. All adults should have this screening starting at age 70 and continuing until age 79. Your health  care provider may recommend screening at age 37. You will have tests every 1-10 years, depending on your results and the type of screening test. People at increased risk should start screening at an earlier age. Screening tests may include: ? Guaiac-based fecal occult blood testing. ? Fecal immunochemical test  (FIT). ? Stool DNA test. ? Virtual colonoscopy. ? Sigmoidoscopy. During this test, a flexible tube with a tiny camera (sigmoidoscope) is used to examine your rectum and lower colon. The sigmoidoscope is inserted through your anus into your rectum and lower colon. ? Colonoscopy. During this test, a long, thin, flexible tube with a tiny camera (colonoscope) is used to examine your entire colon and rectum.  Hepatitis C blood test.  Hepatitis B blood test.  Sexually transmitted disease (STD) testing.  Diabetes screening. This is done by checking your blood sugar (glucose) after you have not eaten for a while (fasting). You may have this done every 1-3 years.  Mammogram. This may be done every 1-2 years. Talk to your health care provider about when you should start having regular mammograms. This may depend on whether you have a family history of breast cancer.  BRCA-related cancer screening. This may be done if you have a family history of breast, ovarian, tubal, or peritoneal cancers.  Pelvic exam and Pap test. This may be done every 3 years starting at age 16. Starting at age 46, this may be done every 5 years if you have a Pap test in combination with an HPV test.  Bone density scan. This is done to screen for osteoporosis. You may have this scan if you are at high risk for osteoporosis. Discuss your test results, treatment options, and if necessary, the need for more tests with your health care provider. Vaccines Your health care provider may recommend certain vaccines, such as:  Influenza vaccine. This is recommended every year.  Tetanus, diphtheria, and acellular pertussis (Tdap, Td) vaccine. You may need a Td booster every 10 years.  Varicella vaccine. You may need this if you have not been vaccinated.  Zoster vaccine. You may need this after age 25.  Measles, mumps, and rubella (MMR) vaccine. You may need at least one dose of MMR if you were born in 1957 or later. You may also  need a second dose.  Pneumococcal 13-valent conjugate (PCV13) vaccine. You may need this if you have certain conditions and were not previously vaccinated.  Pneumococcal polysaccharide (PPSV23) vaccine. You may need one or two doses if you smoke cigarettes or if you have certain conditions.  Meningococcal vaccine. You may need this if you have certain conditions.  Hepatitis A vaccine. You may need this if you have certain conditions or if you travel or work in places where you may be exposed to hepatitis A.  Hepatitis B vaccine. You may need this if you have certain conditions or if you travel or work in places where you may be exposed to hepatitis B.  Haemophilus influenzae type b (Hib) vaccine. You may need this if you have certain conditions. Talk to your health care provider about which screenings and vaccines you need and how often you need them. This information is not intended to replace advice given to you by your health care provider. Make sure you discuss any questions you have with your health care provider. Document Released: 01/18/2015 Document Revised: 02/11/2017 Document Reviewed: 10/23/2014 Elsevier Interactive Patient Education  2019 [employer].

## 2018-06-23 NOTE — Progress Notes (Signed)
ANNUAL PREVENTATIVE CARE GYN  ENCOUNTER NOTE  Subjective:       Rose [name] is a 43 y.o. G47P1011 female here for a routine annual gynecologic exam.  Current complaints: 1. Anxiety 2. Occasional spotting with IUD 3. Desires STI screening 4. Requests Pap smear  History of anxiety, but symptoms returning with COVID-19 pandemic. Seeing therapist and taking medications. Follow up visit this Friday in [place].   Denies difficulty breathing or respiratory distress, chest pain, abdominal pain, excessive vaginal bleeding, dysuria, and leg pain ro swelling.    Gynecologic History  No LMP recorded (lmp unknown). (Menstrual status: IUD).  Contraception: IUD, Mirena  Last Pap: 06/2017. Results were: Negative/Negative  Last mammogram: 05/2017. Results were: BI-RADS 2  Obstetric History  OB History  Gravida Para Term Preterm AB Living  2 1 1   1 1   SAB TAB Ectopic Multiple Live Births  1       1    # Outcome Date GA Lbr Len/2nd Weight Sex Delivery Anes PTL Lv  2 SAB 2008          1 Term 1993    F CS-Unspec  N LIV    Past Medical History:  Diagnosis Date  . Anxiety   . Diabetes mellitus without complication ([place])   . Neck pain   . Right leg pain     Past Surgical History:  Procedure Laterality Date  . CESAREAN SECTION    . CHOLECYSTECTOMY    . LAPAROSCOPY N/A 08/24/2013   Procedure: LAPAROSCOPY OPERATIVE with lysis of adhesions ;  Surgeon: Marylynn Pearson, MD;  Location: [facility];  Service: Gynecology;  Laterality: N/A;  . wrist, cyst removal      Current Outpatient Medications on File Prior to Visit  Medication Sig Dispense Refill  . Ascorbic Acid (VITAMIN C) 1000 MG tablet Take 1,000 mg by mouth 2 (two) times daily.    . citalopram (CELEXA) 20 MG tablet Take 20 mg by mouth daily.    . clonazePAM (KLONOPIN) 1 MG disintegrating tablet Take 1 mg by mouth as needed.    . ferrous sulfate 325 (65 FE) MG tablet Take 325 mg by mouth daily with breakfast.    . glucose  blood (ONE TOUCH ULTRA TEST) test strip 1 each by Other route daily. And lancets 1/day 250.00 100 each 12  . hydrOXYzine (ATARAX/VISTARIL) 25 MG tablet Take 1 tablet (25 mg total) by mouth 3 (three) times daily as needed. 30 tablet 0  . insulin detemir (LEVEMIR) 100 UNIT/ML injection Inject 40 Units into the skin daily.     Marland Kitchen levonorgestrel (MIRENA) 20 MCG/24HR IUD 1 each by Intrauterine route once.    . metFORMIN (GLUCOPHAGE) 1000 MG tablet Take 1,000 mg by mouth 2 (two) times daily.     . Pyridoxine HCl (VITAMIN B-6) 250 MG tablet Take 250 mg by mouth daily.    . simvastatin (ZOCOR) 20 MG tablet Take 20 mg by mouth daily at 6 PM.     . vitamin B-12 (CYANOCOBALAMIN) 250 MCG tablet Take 250 mcg by mouth daily.    . Vitamin D, Cholecalciferol, 50 MCG (2000 UT) CAPS Take 2,000 Units by mouth daily.     No current facility-administered medications on file prior to visit.     Allergies  Allergen Reactions  . Sulfamethoxazole-Trimethoprim Swelling  . Bactrim [Sulfamethoxazole-Trimethoprim] Swelling  . Neurontin [Gabapentin] Itching  . Other Swelling    Unknown antibiotic    Social History   Socioeconomic History  .  Marital status: Single    Spouse name: Not on file  . Number of children: 1  . Years of education: GED  . Highest education level: Not on file  Occupational History    Employer: [employer]  Social Needs  . Financial resource strain: Not on file  . Food insecurity    Worry: Not on file    Inability: Not on file  . Transportation needs    Medical: Not on file    Non-medical: Not on file  Tobacco Use  . Smoking status: Never Smoker  . Smokeless tobacco: Never Used  Substance and Sexual Activity  . Alcohol use: No    Alcohol/week: 1.0 standard drinks    Types: 1 Standard drinks or equivalent per week  . Drug use: No  . Sexual activity: Yes    Birth control/protection: I.U.D.  Lifestyle  . Physical activity    Days per week: Not on file    Minutes per  session: Not on file  . Stress: Not on file  Relationships  . Social Herbalist on phone: Not on file    Gets together: Not on file    Attends religious service: Not on file    Active member of club or organization: Not on file    Attends meetings of clubs or organizations: Not on file    Relationship status: Not on file  . Intimate partner violence    Fear of current or ex partner: Not on file    Emotionally abused: Not on file    Physically abused: Not on file    Forced sexual activity: Not on file  Other Topics Concern  . Not on file  Social History Narrative   Patient is single and she takes care of her daughter. Patient works full time Web designer.   Education GED.   Right handed.   Caffeine one cup of coffee daily and soda daily.   Children One        Family History  Problem Relation Age of Onset  . Aneurysm Mother   . Blindness Father   . High blood pressure Father   . Diabetes Neg Hx     The following portions of the patient's history were reviewed and updated as appropriate: allergies, current medications, past family history, past medical history, past social history, past surgical history and problem list.  Review of Systems  ROS negative except as noted above. Information obtained from patient.    Objective:   BP 125/86   Pulse 100   Ht 5\' 4"  (1.626 m)   Wt 179 lb 1.6 oz (81.2 kg)   LMP  (LMP Unknown)   BMI 30.74 kg/m    CONSTITUTIONAL: Well-developed, well-nourished female in no acute distress.   PSYCHIATRIC: Normal mood and affect. Normal behavior. Normal judgment and thought content.  [place]: Alert and oriented to person, place, and time. Normal muscle tone coordination. No cranial nerve deficit noted.  HENT:  Normocephalic, atraumatic, External right and left ear normal.   EYES: Conjunctivae and EOM are normal. Pupils are equal and round.   NECK: Normal range of motion, supple, no masses.  Normal thyroid.   SKIN: Skin is  warm and dry. No rash noted. Not diaphoretic. No erythema. No pallor. Professional and unprofessional tattoos present.   CARDIOVASCULAR: Normal heart rate noted, regular rhythm, no murmur.  RESPIRATORY: Clear to auscultation bilaterally. Effort and breath sounds normal, no problems with respiration noted.  BREASTS: Symmetric in size.  No masses, skin changes, nipple drainage, or lymphadenopathy.  ABDOMEN: Soft, normal bowel sounds, no distention noted.  No tenderness, rebound or guarding.   PELVIC:  External Genitalia: Normal  Vagina: Normal  Cervix: Normal, IUD strings present at os  Uterus: Normal  Adnexa: Normal  MUSCULOSKELETAL: Normal range of motion. No tenderness.  No cyanosis, clubbing, or edema.  2+ distal pulses.  LYMPHATIC: No Axillary, Supraclavicular, or Inguinal Adenopathy.  Assessment:   Annual gynecologic examination 43 y.o.   Contraception: IUD, Mirena   Obesity 1   Problem List Items Addressed This Visit      Other   IUD (intrauterine device) in place    Other Visit Diagnoses    Well woman exam    -  Primary   Relevant Orders   MM 3D SCREEN BREAST BILATERAL   Cytology - PAP   Cervicovaginal ancillary only   RPR   Hepatitis c antibody (reflex)   HIV Antibody (routine testing w rflx)   Screening mammogram, encounter for       Relevant Orders   MM 3D SCREEN BREAST BILATERAL   Subserous leiomyoma of uterus       Routine screening for STI (sexually transmitted infection)       Relevant Orders   Cervicovaginal ancillary only   RPR   Hepatitis c antibody (reflex)   HIV Antibody (routine testing w rflx)   Screening for cervical cancer       Relevant Orders   Cytology - PAP   History of HPV infection       Relevant Orders   Cytology - PAP   Tattoos       Relevant Orders   Hepatitis c antibody (reflex)      Plan:   Pap: Pap Co Test   Mammogram: Ordered  Labs: See orders   Routine preventative health maintenance measures emphasized:  Exercise/Diet/Weight control, Tobacco Warnings, Alcohol/Substance use risks, Stress Management, Peer Pressure Issues and Safe Sex; see AVS  Reviewed red flag symptoms and when to call  RTC x 1 year for ANNUAL EXAM or sooner if needed   Diona Fanti, CNM [facility], [facility] 06/23/18 2:36 PM

## 2018-06-23 NOTE — Progress Notes (Signed)
Patient here for annual exam, no complaints.  

## 2018-06-25 LAB — HIV ANTIBODY (ROUTINE TESTING W REFLEX): HIV Screen 4th Generation wRfx: NONREACTIVE

## 2018-06-25 LAB — HEPATITIS C ANTIBODY (REFLEX): HCV Ab: 0.2 s/co ratio (ref 0.0–0.9)

## 2018-06-25 LAB — RPR: RPR Ser Ql: NONREACTIVE

## 2018-06-25 LAB — HCV COMMENT:

## 2018-06-27 LAB — CERVICOVAGINAL ANCILLARY ONLY
Bacterial vaginitis: POSITIVE — AB
Candida vaginitis: NEGATIVE
Chlamydia: NEGATIVE
Neisseria Gonorrhea: NEGATIVE
Trichomonas: NEGATIVE

## 2018-06-27 NOTE — Progress Notes (Signed)
Blood work negative for STI. Still awaiting results of Pap and vaginal swab. Thanks, JML.

## 2018-06-28 LAB — CYTOLOGY - PAP
Diagnosis: NEGATIVE
HPV: NOT DETECTED

## 2018-06-29 ENCOUNTER — Telehealth: Payer: Self-pay | Admitting: Certified Nurse Midwife

## 2018-06-29 ENCOUNTER — Other Ambulatory Visit: Payer: Self-pay | Admitting: *Deleted

## 2018-06-29 MED ORDER — METRONIDAZOLE 500 MG PO TABS: 500.0000 mg | ORAL_TABLET | Freq: Two times a day (BID) | ORAL | 0 refills | Status: DC

## 2018-06-29 NOTE — Telephone Encounter (Signed)
The patient called and stated that she needs her provider to put in a diagnostic order fo9r a mammogram at [facility] due to the patient having issues with her left breast. The patient also stated that she never received a call back from the last message that was sent back and is requesting a call back. The patient included that she needs a prescription sent into her pharmacy, I asked the pt which prescription and she stated that "her provider knows which one". Please advise.

## 2018-06-29 NOTE — Telephone Encounter (Signed)
Left pt detailed message mammo was ordered, gave [place] number, and if pt needs refill have the pharmacy send [place] one

## 2018-06-29 NOTE — Telephone Encounter (Signed)
The patient called and stated that she needs her provider to put in a diagnostic order for a mammogram at [facility] due to the patient having issues with her left breast. The patient also stated that she never received a call back from the last message that was sent back and is requesting a call back. The patient included that she needs a prescription sent into her pharmacy, I asked the pt which prescription and she stated that "her provider knows which one". Please advise./   Pt called back and stated that she is experiencing breast discharge and leaking with sharp severe pain. The patient stated that she needs to speak with a nurse as soon as possible and that a diagnostic order needs to be sent to [facility] for the pt.

## 2018-06-30 NOTE — Telephone Encounter (Signed)
AC spoke to patient.  Orders done.

## 2018-07-01 NOTE — Telephone Encounter (Signed)
Left message asking pt if she received the message Amy left in regard to her mammo and med refill.  Informed pt that if she needs further assistance to contact the office.

## 2018-12-12 ENCOUNTER — Ambulatory Visit (INDEPENDENT_AMBULATORY_CARE_PROVIDER_SITE_OTHER): Payer: BC Managed Care – PPO | Admitting: Urology

## 2018-12-12 ENCOUNTER — Encounter: Payer: Self-pay | Admitting: Urology

## 2018-12-12 ENCOUNTER — Other Ambulatory Visit: Payer: Self-pay

## 2018-12-12 VITALS — BP 149/96 | HR 113 | Ht 64.0 in | Wt 177.0 lb

## 2018-12-12 DIAGNOSIS — R319 Hematuria, unspecified: Secondary | ICD-10-CM

## 2018-12-12 DIAGNOSIS — R310 Gross hematuria: Secondary | ICD-10-CM

## 2018-12-12 LAB — URINALYSIS, COMPLETE
Bilirubin, UA: NEGATIVE
Glucose, UA: NEGATIVE
Ketones, UA: NEGATIVE
Nitrite, UA: NEGATIVE
Specific Gravity, UA: 1.02 (ref 1.005–1.030)
Urobilinogen, Ur: 0.2 mg/dL (ref 0.2–1.0)
pH, UA: 6 (ref 5.0–7.5)

## 2018-12-12 LAB — MICROSCOPIC EXAMINATION

## 2018-12-12 NOTE — Progress Notes (Signed)
12/12/2018 1:13 PM   Rose [name] 1975/12/05 [id number]  Referring provider: Department, [facility] [place],   [id number]  Chief Complaint  Patient presents with  . Hematuria    HPI: I was consulted to assess the patient's microscopic hematuria.  Having said that she had some lower abdominal discomfort associated frequency and perhaps pink-colored urine.  She said she was not treated with an antibiotic instead that she had no infection  She has a smoking history.  Does not take daily aspirin or blood thinner  She wears 1 liner per day for incontinence.  She voids every 2 hours and gets up once a night.  No history of kidney stones latter surgery or bladder infections.  No hysterectomy  Modifying factors: There are no other modifying factors  Associated signs and symptoms: There are no other associated signs and symptoms Aggravating and relieving factors: There are no other aggravating or relieving factors Severity: Moderate Duration: Persistent   PMH: Past Medical History:  Diagnosis Date  . Anxiety   . Diabetes mellitus without complication ([place])   . Neck pain   . Right leg pain     Surgical History: Past Surgical History:  Procedure Laterality Date  . CESAREAN SECTION    . CHOLECYSTECTOMY    . LAPAROSCOPY N/A 08/24/2013   Procedure: LAPAROSCOPY OPERATIVE with lysis of adhesions ;  Surgeon: Marylynn Pearson, MD;  Location: [facility];  Service: Gynecology;  Laterality: N/A;  . wrist, cyst removal      Home Medications:  Allergies as of 12/12/2018      Reactions   Sulfamethoxazole-trimethoprim Swelling   Bactrim [sulfamethoxazole-trimethoprim] Swelling   Neurontin [gabapentin] Itching   Other Swelling   Unknown antibiotic      Medication List       Accurate as of December 12, 2018  1:13 PM. If you have any questions, ask your nurse or doctor.        busPIRone 15 MG tablet Commonly known as: BUSPAR  Take 15 mg by mouth 2 (two) times daily.   citalopram 20 MG tablet Commonly known as: CELEXA Take 20 mg by mouth daily.   clonazePAM 0.5 MG tablet Commonly known as: KLONOPIN Take 0.5 mg by mouth 2 (two) times daily as needed.   ferrous sulfate 325 (65 FE) MG tablet Take 325 mg by mouth daily with breakfast.   glucose blood test strip Commonly known as: ONE TOUCH ULTRA TEST 1 each by Other route daily. And lancets 1/day 250.00   hydrOXYzine 25 MG tablet Commonly known as: ATARAX/VISTARIL Take 1 tablet (25 mg total) by mouth 3 (three) times daily as needed.   insulin detemir 100 UNIT/ML injection Commonly known as: LEVEMIR Inject 40 Units into the skin daily.   levonorgestrel 20 MCG/24HR IUD Commonly known as: MIRENA 1 each by Intrauterine route once.   metFORMIN 1000 MG tablet Commonly known as: GLUCOPHAGE Take 1,000 mg by mouth 2 (two) times daily.   metroNIDAZOLE 500 MG tablet Commonly known as: FLAGYL Take 1 tablet (500 mg total) by mouth 2 (two) times daily.   simvastatin 20 MG tablet Commonly known as: ZOCOR Take 20 mg by mouth daily at 6 PM.   vitamin B-12 250 MCG tablet Commonly known as: CYANOCOBALAMIN Take 250 mcg by mouth daily.   vitamin B-6 250 MG tablet Take 250 mg by mouth daily.   vitamin C 1000 MG tablet Take 1,000 mg by mouth 2 (two) times daily.  Vitamin D (Cholecalciferol) 50 MCG (2000 UT) Caps Take 2,000 Units by mouth daily.       Allergies:  Allergies  Allergen Reactions  . Sulfamethoxazole-Trimethoprim Swelling  . Bactrim [Sulfamethoxazole-Trimethoprim] Swelling  . Neurontin [Gabapentin] Itching  . Other Swelling    Unknown antibiotic    Family History: Family History  Problem Relation Age of Onset  . Aneurysm Mother   . Blindness Father   . High blood pressure Father   . Diabetes Neg Hx     Social History:  reports that she has never smoked. She has never used smokeless tobacco. She reports that she does not drink  alcohol or use drugs.  ROS: UROLOGY Frequent Urination?: Yes Hard to postpone urination?: No Burning/pain with urination?: No Get up at night to urinate?: Yes Leakage of urine?: No Urine stream starts and stops?: No Trouble starting stream?: No Do you have to strain to urinate?: No Blood in urine?: Yes Urinary tract infection?: No Sexually transmitted disease?: No Injury to kidneys or bladder?: No Painful intercourse?: No Weak stream?: No Currently pregnant?: No Vaginal bleeding?: No Last menstrual period?: N  Gastrointestinal Nausea?: No Vomiting?: No Indigestion/heartburn?: No Diarrhea?: No Constipation?: No  Constitutional Fever: No Night sweats?: No Weight loss?: No Fatigue?: No  Skin Skin rash/lesions?: No Itching?: No  Eyes Blurred vision?: No Double vision?: No  Ears/Nose/Throat Sore throat?: No Sinus problems?: No  Hematologic/Lymphatic Swollen glands?: No Easy bruising?: No  Cardiovascular Leg swelling?: No Chest pain?: No  Respiratory Cough?: No Shortness of breath?: No  Endocrine Excessive thirst?: No  Musculoskeletal Back pain?: No Joint pain?: No  Neurological Headaches?: No Dizziness?: No  Psychologic Depression?: No Anxiety?: No  Physical Exam: BP (!) 149/96   Pulse (!) 113   Ht 5\' 4"  (1.626 m)   Wt 80.3 kg   BMI 30.38 kg/m   Constitutional:  Alert and oriented, No acute distress. HEENT: [place] AT, moist mucus membranes.  Trachea midline, no masses. Cardiovascular: No clubbing, cyanosis, or edema. Respiratory: Normal respiratory effort, no increased work of breathing. GI: Abdomen is soft, nontender, nondistended, no abdominal masses GU: No CVA tenderness.  No abdominal tenderness Skin: No rashes, bruises or suspicious lesions. Lymph: No cervical or inguinal adenopathy. Neurologic: Grossly intact, no focal deficits, moving all 4 extremities. Psychiatric: Normal mood and affect.  Laboratory Data: Lab Results   Component Value Date   WBC 11.5 (H) 05/24/2018   HGB 11.8 (L) 05/24/2018   HCT 36.2 05/24/2018   MCV 77.7 (L) 05/24/2018   PLT 316 05/24/2018    Lab Results  Component Value Date   CREATININE 0.65 05/24/2018    No results found for: PSA  No results found for: TESTOSTERONE  Lab Results  Component Value Date   HGBA1C 9.1 (H) 09/18/2013    Urinalysis    Component Value Date/Time   COLORURINE Amber 12/28/2013 0225   APPEARANCEUR Hazy 12/28/2013 0225   LABSPEC 1.044 12/28/2013 0225   PHURINE 5.0 12/28/2013 0225   PHURINE 7.0 11/25/2006 1018   GLUCOSEU Negative 12/28/2013 0225   HGBUR Negative 12/28/2013 0225   HGBUR TRACE (A) 11/25/2006 1018   BILIRUBINUR Negative 12/28/2013 0225   KETONESUR Trace 12/28/2013 0225   KETONESUR 15 (A) 11/25/2006 1018   PROTEINUR 100 mg/dL 12/28/2013 0225   PROTEINUR 30 (A) 11/25/2006 1018   UROBILINOGEN 1.0 11/25/2006 1018   NITRITE Negative 12/28/2013 0225   NITRITE NEGATIVE 11/25/2006 1018   LEUKOCYTESUR Negative 12/28/2013 0225    Pertinent Imaging:  Assessment & Plan: Work-up for hematuria described.  Return for pelvic and cystoscopy.  CT scan ordered urine culture ordered.,  Based on her symptoms she may have had a bladder infection.   1. Hematuria, unspecified type   - Urinalysis, Complete   No follow-ups on file.  Reece Packer, MD  [facility] [address], [place], [place] [address] [phone]

## 2018-12-14 LAB — CULTURE, URINE COMPREHENSIVE

## 2018-12-29 ENCOUNTER — Other Ambulatory Visit: Payer: Self-pay

## 2018-12-29 ENCOUNTER — Ambulatory Visit
Admission: RE | Admit: 2018-12-29 | Discharge: 2018-12-29 | Disposition: A | Discharge status: Home / Self Care | Payer: BC Managed Care – PPO | Source: Ambulatory Visit | Attending: Urology | Admitting: Urology

## 2018-12-29 DIAGNOSIS — R310 Gross hematuria: Secondary | ICD-10-CM | POA: Insufficient documentation

## 2018-12-29 LAB — POCT I-STAT CREATININE: Creatinine, Ser: 0.7 mg/dL (ref 0.44–1.00)

## 2018-12-29 MED ORDER — IOHEXOL 300 MG/ML  SOLN
125.0000 mL | Freq: Once | INTRAMUSCULAR | Status: AC | PRN
  Administered 2018-12-29: 125 mL via INTRAVENOUS

## 2019-01-02 ENCOUNTER — Other Ambulatory Visit: Payer: Self-pay

## 2019-01-02 ENCOUNTER — Telehealth: Payer: Self-pay | Admitting: Urology

## 2019-01-02 ENCOUNTER — Other Ambulatory Visit: Payer: BC Managed Care – PPO | Admitting: Urology

## 2019-01-02 MED ORDER — SILODOSIN 8 MG PO CAPS: 8.0000 mg | ORAL_CAPSULE | Freq: Every day | ORAL | 0 refills | Status: DC

## 2019-01-02 NOTE — Telephone Encounter (Signed)
Per Dr. Matilde Sprang left patient message to call for CTscan results  CTscan shows Left stone passing, would like to start Flomax and follow up with PA in 1-2wk with a KUB for follow up. If she develops Left flank pain or fever, chills call or ER  Keep Cysto follow up on 01-22-19

## 2019-01-02 NOTE — Telephone Encounter (Signed)
Pt returning your call. She states that she is working until [employer] today.

## 2019-01-02 NOTE — Telephone Encounter (Signed)
Patient called back to let us know that the rapaflo was not covered by her ins and wanted to know what to do. I spoke with Judson Roch the clinical lead and she advised the patient to increase her water intake and to call the clinic if her pain got worse. Patient was in agreement and all questions were answered.    Sharyn Lull

## 2019-01-02 NOTE — Telephone Encounter (Signed)
Patient notified and follow up was made with PA next week with KUB prior. Patient has a sulfa allergy script was changed to rapaflo per Dr. Matilde Sprang. Patient was encouraged to increase water intake to help with stone passage

## 2019-01-09 ENCOUNTER — Other Ambulatory Visit: Payer: Self-pay

## 2019-01-09 DIAGNOSIS — N200 Calculus of kidney: Secondary | ICD-10-CM

## 2019-01-10 ENCOUNTER — Ambulatory Visit
Admission: RE | Admit: 2019-01-10 | Discharge: 2019-01-10 | Disposition: A | Discharge status: Home / Self Care | Payer: BC Managed Care – PPO | Source: Ambulatory Visit | Attending: Physician Assistant | Admitting: Physician Assistant

## 2019-01-10 ENCOUNTER — Ambulatory Visit (INDEPENDENT_AMBULATORY_CARE_PROVIDER_SITE_OTHER): Payer: BC Managed Care – PPO | Admitting: Physician Assistant

## 2019-01-10 ENCOUNTER — Other Ambulatory Visit: Payer: Self-pay

## 2019-01-10 ENCOUNTER — Other Ambulatory Visit: Payer: Self-pay | Admitting: Radiology

## 2019-01-10 VITALS — BP 133/87 | HR 111 | Ht 66.0 in | Wt 176.0 lb

## 2019-01-10 DIAGNOSIS — N281 Cyst of kidney, acquired: Secondary | ICD-10-CM | POA: Diagnosis not present

## 2019-01-10 DIAGNOSIS — N200 Calculus of kidney: Secondary | ICD-10-CM

## 2019-01-10 DIAGNOSIS — N201 Calculus of ureter: Secondary | ICD-10-CM

## 2019-01-10 LAB — URINALYSIS, COMPLETE
Bilirubin, UA: NEGATIVE
Glucose, UA: NEGATIVE
Ketones, UA: NEGATIVE
Nitrite, UA: NEGATIVE
Specific Gravity, UA: 1.025 (ref 1.005–1.030)
Urobilinogen, Ur: 0.2 mg/dL (ref 0.2–1.0)
pH, UA: 5.5 (ref 5.0–7.5)

## 2019-01-10 LAB — MICROSCOPIC EXAMINATION

## 2019-01-10 NOTE — Progress Notes (Signed)
01/10/2019 2:06 PM   Rose [name] [birth year] [id number]  CC: 4 mm left UPJ stone  HPI: Rose Dean Cooksey is a 44 y.o. female who presents for management of a 4 mm left UPJ stone.   She was seen by Dr. Matilde Sprang in consultation for microscopic hematuria on 12/12/2018.  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  She presents to clinic today for management of the left UPJ stone.  She was prescribed Rapaflo for MET, however her insurance would not cover this.  She has not been straining her urine, however she does not believe she has passed a stone.  Today, she reports vague symptoms including occasional bilateral lower abdominal pain wrapping around her flanks as well as intermittent left flank pain.  She denies fever, chills, vomiting, chronic back pain, and recurrent UTI.  She does report occasional nausea.  Chronic medical conditions include diabetes on insulin and Metformin.  She is not on anticoagulants.  She denies a history of cardiac or pulmonary problems.  In-office UA today positive for 1+ blood, trace protein, and trace leukocyte esterase; urine microscopy with 11-30 WBCs/HPF and 3-10 RBCs/HPF.  Follow-up KUB today consistent with persistent proximal left ureteral stone.  PMH: Past Medical History:  Diagnosis Date  . Anxiety   . Diabetes mellitus without complication ([place])   . Neck pain   . Right leg pain     Surgical History: Past Surgical History:  Procedure Laterality Date  . CESAREAN SECTION    . CHOLECYSTECTOMY    . LAPAROSCOPY N/A 08/24/2013   Procedure: LAPAROSCOPY OPERATIVE with lysis of adhesions ;  Surgeon: Marylynn Pearson, MD;  Location: [facility];  Service: Gynecology;  Laterality: N/A;  . wrist, cyst  removal      Home Medications:  Allergies as of 01/10/2019      Reactions   Sulfamethoxazole-trimethoprim Swelling   Bactrim [sulfamethoxazole-trimethoprim] Swelling   Neurontin [gabapentin] Itching   Other Swelling   Unknown antibiotic      Medication List       Accurate as of January 10, 2019  2:06 PM. If you have any questions, ask your nurse or doctor.        busPIRone 15 MG tablet Commonly known as: BUSPAR Take 15 mg by mouth 2 (two) times daily.   citalopram 20 MG tablet Commonly known as: CELEXA Take 20 mg by mouth daily.   clonazePAM 0.5 MG tablet Commonly known as: KLONOPIN Take 0.5 mg by mouth 2 (two) times daily as needed.   ferrous sulfate 325 (65 FE) MG tablet Take 325 mg by mouth daily with breakfast.   glucose blood test strip Commonly known as: ONE TOUCH ULTRA TEST 1 each by Other route daily. And lancets 1/day 250.00   hydrOXYzine 25 MG tablet Commonly known as: ATARAX/VISTARIL Take 1 tablet (25 mg total) by mouth 3 (three) times daily as needed.   insulin detemir 100 UNIT/ML injection Commonly known as: LEVEMIR Inject 40 Units into the skin daily.   levonorgestrel 20 MCG/24HR IUD Commonly known as: MIRENA 1 each by Intrauterine route once.   metFORMIN 1000 MG tablet Commonly known as: GLUCOPHAGE Take 1,000 mg by mouth 2 (two) times daily.   metroNIDAZOLE 500 MG tablet Commonly known as: FLAGYL Take 1 tablet (500  mg total) by mouth 2 (two) times daily.   silodosin 8 MG Caps capsule Commonly known as: RAPAFLO Take 1 capsule (8 mg total) by mouth daily with breakfast.   simvastatin 20 MG tablet Commonly known as: ZOCOR Take 20 mg by mouth daily at 6 PM.   vitamin B-12 250 MCG tablet Commonly known as: CYANOCOBALAMIN Take 250 mcg by mouth daily.   vitamin B-6 250 MG tablet Take 250 mg by mouth daily.   vitamin C 1000 MG tablet Take 1,000 mg by mouth 2 (two) times daily.   Vitamin D (Cholecalciferol) 50 MCG (2000 UT) Caps Take  2,000 Units by mouth daily.       Allergies:  Allergies  Allergen Reactions  . Sulfamethoxazole-Trimethoprim Swelling  . Bactrim [Sulfamethoxazole-Trimethoprim] Swelling  . Neurontin [Gabapentin] Itching  . Other Swelling    Unknown antibiotic    Family History: Family History  Problem Relation Age of Onset  . Aneurysm Mother   . Blindness Father   . High blood pressure Father   . Diabetes Neg Hx     Social History:  reports that she has never smoked. She has never used smokeless tobacco. She reports that she does not drink alcohol or use drugs.  ROS: UROLOGY Frequent Urination?: Yes Hard to postpone urination?: Yes Burning/pain with urination?: No Get up at night to urinate?: Yes Leakage of urine?: Yes Urine stream starts and stops?: No Trouble starting stream?: No Do you have to strain to urinate?: No Blood in urine?: No Urinary tract infection?: No Sexually transmitted disease?: No Injury to kidneys or bladder?: No Painful intercourse?: No Weak stream?: No Currently pregnant?: No Vaginal bleeding?: No Last menstrual period?: n  Gastrointestinal Nausea?: No Vomiting?: No Indigestion/heartburn?: Yes Diarrhea?: No Constipation?: Yes  Constitutional Fever: No Night sweats?: No Weight loss?: No Fatigue?: No  Skin Skin rash/lesions?: No Itching?: No  Eyes Blurred vision?: No Double vision?: No  Ears/Nose/Throat Sore throat?: No Sinus problems?: No  Hematologic/Lymphatic Swollen glands?: No Easy bruising?: No  Cardiovascular Leg swelling?: No Chest pain?: No  Respiratory Cough?: No Shortness of breath?: No  Endocrine Excessive thirst?: No  Musculoskeletal Back pain?: Yes Joint pain?: Yes  Neurological Headaches?: No Dizziness?: No  Psychologic Depression?: Yes Anxiety?: Yes  Physical Exam: BP 133/87   Pulse (!) 111   Ht _0  (1.676 m)   Wt 176 lb (79.8 kg)   BMI 28.41 kg/m   Constitutional:  Alert and oriented, No  acute distress. HEENT: [place] AT, moist mucus membranes.  Trachea midline, no masses. Cardiovascular: No clubbing, cyanosis, or edema. Respiratory: Normal respiratory effort, no increased work of breathing. GI: Abdomen is soft, nontender, nondistended, no abdominal masses GU: No CVA tenderness Lymph: No cervical or inguinal lymphadenopathy. Skin: No rashes, bruises or suspicious lesions. Neurologic: Grossly intact, no focal deficits, moving all 4 extremities. Psychiatric: Normal mood and affect.  Laboratory Data: Results for orders placed or performed in visit on 01/10/19  Microscopic Examination   URINE  Result Value Ref Range   WBC, UA 11-30 (A) 0 - 5 /hpf   RBC 3-10 (A) 0 - 2 /hpf   Epithelial Cells (non renal) 0-10 0 - 10 /hpf   Bacteria, UA Few None seen/Few  Urinalysis, Complete  Result Value Ref Range   Specific Gravity, UA 1.025 1.005 - 1.030   pH, UA 5.5 5.0 - 7.5   Color, UA Yellow Yellow   Appearance Ur Cloudy (A) Clear   Leukocytes,UA Trace (A) Negative  Protein,UA Trace (A) Negative/Trace   Glucose, UA Negative Negative   Ketones, UA Negative Negative   RBC, UA 1+ (A) Negative   Bilirubin, UA Negative Negative   Urobilinogen, Ur 0.2 0.2 - 1.0 mg/dL   Nitrite, UA Negative Negative   Microscopic Examination See below:    Pertinent Imaging: Results for orders placed during the hospital encounter of 12/29/18  CT HEMATURIA WORKUP   Narrative CLINICAL DATA:  Hematuria for several months with occasional low back pain.  EXAM: CT ABDOMEN AND PELVIS WITHOUT AND WITH CONTRAST  TECHNIQUE: Multidetector CT imaging of the abdomen and pelvis was performed following the standard protocol before and following the bolus administration of intravenous contrast.  CONTRAST:  156m OMNIPAQUE IOHEXOL 300 MG/ML  SOLN  COMPARISON:  None.  FINDINGS: Lower chest: Unremarkable.  Hepatobiliary: No suspicious focal abnormality within the liver parenchyma. Gallbladder is  surgically absent. No intrahepatic or extrahepatic biliary dilation.  Pancreas: No focal mass lesion. No dilatation of the main duct. No intraparenchymal cyst. No peripancreatic edema.  Spleen: No splenomegaly. No focal mass lesion.  Adrenals/Urinary Tract: 6 x 6 x 5 mm interpolar right renal stone identified. Another 1-2 mm nonobstructing stone is seen in the lower pole of the right kidney. No right ureteral stone. No secondary changes in the right kidney or ureter.  2 mm nonobstructing stone noted lower pole left kidney (well seen coronal image 68/series 6). 4 x 4 x 6 mm stone is identified at the left UPJ with minimal left hydronephrosis. No left ureteral stone.  No bladder stones.  Postcontrast imaging shows no enhancing lesion in either kidney. 12 mm homogeneous low-density lesion in the lower pole left kidney has attenuation slightly higher than would be expected for a simple cyst and is compatible with a cyst complicated by proteinaceous debris or hemorrhage.  No evidence for wall thickening or filling defect in the right intrarenal collecting system or renal pelvis. Filling defects are seen in the left intrarenal collecting system. This could be a combination of non dependent unopacified urine and/or clot. Right ureter incompletely opacified but shows no focal dilatation or mass lesion along its length. Left ureter is opacified and unremarkable. No focal bladder wall abnormality evident.  Stomach/Bowel: Tiny hiatal hernia. Stomach otherwise unremarkable. Duodenum is normally positioned as is the ligament of Treitz. No small bowel wall thickening. No small bowel dilatation. The terminal ileum is normal. The appendix is normal. No gross colonic mass. No colonic wall thickening.  Vascular/Lymphatic: No abdominal aortic aneurysm. No abdominal aortic atherosclerotic calcification. There is no gastrohepatic or hepatoduodenal ligament lymphadenopathy. No retroperitoneal  or mesenteric lymphadenopathy. No pelvic sidewall lymphadenopathy.  Reproductive: IUD visualized in the uterus. There is no adnexal mass.  Other: No intraperitoneal free fluid.  Musculoskeletal: No worrisome lytic or sclerotic osseous abnormality.  IMPRESSION: 1. 4 x 4 x 6 mm stone identified at the left UPJ with minimal left hydronephrosis. Delayed imaging shows non dependent filling defect in calices of the left kidney which may be related to non dependent unopacified urine and/or clot. Follow-up may be warranted to ensure that this resolves. 2. Bilateral nonobstructing nephrolithiasis. 3. 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 Attention on follow-up recommended. 4. Tiny hiatal hernia.   Electronically Signed   By: EMisty StanleyM.D.   On: 12/29/2018 15:07    KUB, 01/10/2019: CLINICAL  DATA:  44 year old female with a history of kidney stones  EXAM: ABDOMEN - 1 VIEW  COMPARISON:  CT 12/29/2018  FINDINGS: Gas within small bowel and colon.  No abnormal distention.  No significant stool burden.  Surgical changes of cholecystectomy.  IUD within the anatomic pelvis.  Rounded calcific density projects inferior to the right twelfth rib, over the renal silhouette, compatible with findings on prior CT scan.  Rounded calcific density between the left L2-L3 transverse process, compatible with the proximal ureteral stone on prior CT.  Pelvic phleboliths.  No displaced fracture.  IMPRESSION: Rounded calcific density between the left L2-L3 transverse process, compatible with persisting proximal left ureteral stone.  Plain film demonstration of right-sided nephrolithiasis.   Electronically Signed   By: Corrie Mckusick D.O.   On: 01/10/2019 16:40  I personally reviewed the images  referenced above and note the presence and subsequent persistence of a 4 mm left UPJ stone.  Assessment & Plan:   1. Left ureteral stone 44 year old female presents today for management of a 4 mm left UPJ stone.  She is only mildly symptomatic of this.  UA significant for pyuria and microscopic hematuria; will send for culture and treat as indicated.    We discussed various treatment options for her stone including trial of passage vs. ESWL vs. ureteroscopy with laser lithotripsy and stent. We discussed the risks and benefits of each including bleeding, infection, damage to surrounding structures, efficacy with need for possible further intervention, and need for temporary ureteral stent.  We also discussed common symptoms associated with stent discomfort, including gross hematuria, flank pain, dysuria, urgency, and frequency.  I explained that this discomfort may continue for the duration of her stent being in place.  With a stone of this size, I estimate the chance of spontaneous passage at >50%.  I counseled her that I could represcribe generic Flomax today and initiate her on a trial of passage at this point.  However, she declines this and would like to proceed with ureteroscopy.    Booking sheet filled out.  She will not require medical clearance.  Counseled patient that our surgical scheduler, Amy, will contact her with further details.  Counseled patient to contact her office immediately if she develops fever, chills, nausea, vomiting, or acute worsening and flank pain.  She expressed understanding. - Urinalysis, Complete - CULTURE, URINE COMPREHENSIVE - Microscopic Examination  2. Renal cyst Postoperative follow-up will include 4-week renal ultrasound.  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  Return if symptoms worsen or fail to improve.  Debroah Loop, PA-C  [facility] [address], [place], [place] [address] [phone]

## 2019-01-10 NOTE — H&P (View-Only) (Signed)
01/10/2019 2:06 PM   Rose Dean Massoud [birth year] [id number]  CC: 4 mm left UPJ stone  HPI: Rose [name] is a 44 y.o. female who presents for management of a 4 mm left UPJ stone.   She was seen by Dr. Matilde Sprang in consultation for microscopic hematuria on 12/12/2018.  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  She presents to clinic today for management of the left UPJ stone.  She was prescribed Rapaflo for MET, however her insurance would not cover this.  She has not been straining her urine, however she does not believe she has passed a stone.  Today, she reports vague symptoms including occasional bilateral lower abdominal pain wrapping around her flanks as well as intermittent left flank pain.  She denies fever, chills, vomiting, chronic back pain, and recurrent UTI.  She does report occasional nausea.  Chronic medical conditions include diabetes on insulin and Metformin.  She is not on anticoagulants.  She denies a history of cardiac or pulmonary problems.  In-office UA today positive for 1+ blood, trace protein, and trace leukocyte esterase; urine microscopy with 11-30 WBCs/HPF and 3-10 RBCs/HPF.  Follow-up KUB today consistent with persistent proximal left ureteral stone.  PMH: Past Medical History:  Diagnosis Date  . Anxiety   . Diabetes mellitus without complication ([place])   . Neck pain   . Right leg pain     Surgical History: Past Surgical History:  Procedure Laterality Date  . CESAREAN SECTION    . CHOLECYSTECTOMY    . LAPAROSCOPY N/A 08/24/2013   Procedure: LAPAROSCOPY OPERATIVE with lysis of adhesions ;  Surgeon: Marylynn Pearson, MD;  Location: [facility];  Service: Gynecology;  Laterality: N/A;  . wrist, cyst  removal      Home Medications:  Allergies as of 01/10/2019      Reactions   Sulfamethoxazole-trimethoprim Swelling   Bactrim [sulfamethoxazole-trimethoprim] Swelling   Neurontin [gabapentin] Itching   Other Swelling   Unknown antibiotic      Medication List       Accurate as of January 10, 2019  2:06 PM. If you have any questions, ask your nurse or doctor.        busPIRone 15 MG tablet Commonly known as: BUSPAR Take 15 mg by mouth 2 (two) times daily.   citalopram 20 MG tablet Commonly known as: CELEXA Take 20 mg by mouth daily.   clonazePAM 0.5 MG tablet Commonly known as: KLONOPIN Take 0.5 mg by mouth 2 (two) times daily as needed.   ferrous sulfate 325 (65 FE) MG tablet Take 325 mg by mouth daily with breakfast.   glucose blood test strip Commonly known as: ONE TOUCH ULTRA TEST 1 each by Other route daily. And lancets 1/day 250.00   hydrOXYzine 25 MG tablet Commonly known as: ATARAX/VISTARIL Take 1 tablet (25 mg total) by mouth 3 (three) times daily as needed.   insulin detemir 100 UNIT/ML injection Commonly known as: LEVEMIR Inject 40 Units into the skin daily.   levonorgestrel 20 MCG/24HR IUD Commonly known as: MIRENA 1 each by Intrauterine route once.   metFORMIN 1000 MG tablet Commonly known as: GLUCOPHAGE Take 1,000 mg by mouth 2 (two) times daily.   metroNIDAZOLE 500 MG tablet Commonly known as: FLAGYL Take 1 tablet (500  mg total) by mouth 2 (two) times daily.   silodosin 8 MG Caps capsule Commonly known as: RAPAFLO Take 1 capsule (8 mg total) by mouth daily with breakfast.   simvastatin 20 MG tablet Commonly known as: ZOCOR Take 20 mg by mouth daily at 6 PM.   vitamin B-12 250 MCG tablet Commonly known as: CYANOCOBALAMIN Take 250 mcg by mouth daily.   vitamin B-6 250 MG tablet Take 250 mg by mouth daily.   vitamin C 1000 MG tablet Take 1,000 mg by mouth 2 (two) times daily.   Vitamin D (Cholecalciferol) 50 MCG (2000 UT) Caps Take  2,000 Units by mouth daily.       Allergies:  Allergies  Allergen Reactions  . Sulfamethoxazole-Trimethoprim Swelling  . Bactrim [Sulfamethoxazole-Trimethoprim] Swelling  . Neurontin [Gabapentin] Itching  . Other Swelling    Unknown antibiotic    Family History: Family History  Problem Relation Age of Onset  . Aneurysm Mother   . Blindness Father   . High blood pressure Father   . Diabetes Neg Hx     Social History:  reports that she has never smoked. She has never used smokeless tobacco. She reports that she does not drink alcohol or use drugs.  ROS: UROLOGY Frequent Urination?: Yes Hard to postpone urination?: Yes Burning/pain with urination?: No Get up at night to urinate?: Yes Leakage of urine?: Yes Urine stream starts and stops?: No Trouble starting stream?: No Do you have to strain to urinate?: No Blood in urine?: No Urinary tract infection?: No Sexually transmitted disease?: No Injury to kidneys or bladder?: No Painful intercourse?: No Weak stream?: No Currently pregnant?: No Vaginal bleeding?: No Last menstrual period?: n  Gastrointestinal Nausea?: No Vomiting?: No Indigestion/heartburn?: Yes Diarrhea?: No Constipation?: Yes  Constitutional Fever: No Night sweats?: No Weight loss?: No Fatigue?: No  Skin Skin rash/lesions?: No Itching?: No  Eyes Blurred vision?: No Double vision?: No  Ears/Nose/Throat Sore throat?: No Sinus problems?: No  Hematologic/Lymphatic Swollen glands?: No Easy bruising?: No  Cardiovascular Leg swelling?: No Chest pain?: No  Respiratory Cough?: No Shortness of breath?: No  Endocrine Excessive thirst?: No  Musculoskeletal Back pain?: Yes Joint pain?: Yes  Neurological Headaches?: No Dizziness?: No  Psychologic Depression?: Yes Anxiety?: Yes  Physical Exam: BP 133/87   Pulse (!) 111   Ht _0  (1.676 m)   Wt 176 lb (79.8 kg)   BMI 28.41 kg/m   Constitutional:  Alert and oriented, No  acute distress. HEENT: [place] AT, moist mucus membranes.  Trachea midline, no masses. Cardiovascular: No clubbing, cyanosis, or edema. Respiratory: Normal respiratory effort, no increased work of breathing. GI: Abdomen is soft, nontender, nondistended, no abdominal masses GU: No CVA tenderness Lymph: No cervical or inguinal lymphadenopathy. Skin: No rashes, bruises or suspicious lesions. Neurologic: Grossly intact, no focal deficits, moving all 4 extremities. Psychiatric: Normal mood and affect.  Laboratory Data: Results for orders placed or performed in visit on 01/10/19  Microscopic Examination   URINE  Result Value Ref Range   WBC, UA 11-30 (A) 0 - 5 /hpf   RBC 3-10 (A) 0 - 2 /hpf   Epithelial Cells (non renal) 0-10 0 - 10 /hpf   Bacteria, UA Few None seen/Few  Urinalysis, Complete  Result Value Ref Range   Specific Gravity, UA 1.025 1.005 - 1.030   pH, UA 5.5 5.0 - 7.5   Color, UA Yellow Yellow   Appearance Ur Cloudy (A) Clear   Leukocytes,UA Trace (A) Negative  Protein,UA Trace (A) Negative/Trace   Glucose, UA Negative Negative   Ketones, UA Negative Negative   RBC, UA 1+ (A) Negative   Bilirubin, UA Negative Negative   Urobilinogen, Ur 0.2 0.2 - 1.0 mg/dL   Nitrite, UA Negative Negative   Microscopic Examination See below:    Pertinent Imaging: Results for orders placed during the hospital encounter of 12/29/18  CT HEMATURIA WORKUP   Narrative CLINICAL DATA:  Hematuria for several months with occasional low back pain.  EXAM: CT ABDOMEN AND PELVIS WITHOUT AND WITH CONTRAST  TECHNIQUE: Multidetector CT imaging of the abdomen and pelvis was performed following the standard protocol before and following the bolus administration of intravenous contrast.  CONTRAST:  125mL OMNIPAQUE IOHEXOL 300 MG/ML  SOLN  COMPARISON:  None.  FINDINGS: Lower chest: Unremarkable.  Hepatobiliary: No suspicious focal abnormality within the liver parenchyma. Gallbladder is  surgically absent. No intrahepatic or extrahepatic biliary dilation.  Pancreas: No focal mass lesion. No dilatation of the main duct. No intraparenchymal cyst. No peripancreatic edema.  Spleen: No splenomegaly. No focal mass lesion.  Adrenals/Urinary Tract: 6 x 6 x 5 mm interpolar right renal stone identified. Another 1-2 mm nonobstructing stone is seen in the lower pole of the right kidney. No right ureteral stone. No secondary changes in the right kidney or ureter.  2 mm nonobstructing stone noted lower pole left kidney (well seen coronal image 68/series 6). 4 x 4 x 6 mm stone is identified at the left UPJ with minimal left hydronephrosis. No left ureteral stone.  No bladder stones.  Postcontrast imaging shows no enhancing lesion in either kidney. 12 mm homogeneous low-density lesion in the lower pole left kidney has attenuation slightly higher than would be expected for a simple cyst and is compatible with a cyst complicated by proteinaceous debris or hemorrhage.  No evidence for wall thickening or filling defect in the right intrarenal collecting system or renal pelvis. Filling defects are seen in the left intrarenal collecting system. This could be a combination of non dependent unopacified urine and/or clot. Right ureter incompletely opacified but shows no focal dilatation or mass lesion along its length. Left ureter is opacified and unremarkable. No focal bladder wall abnormality evident.  Stomach/Bowel: Tiny hiatal hernia. Stomach otherwise unremarkable. Duodenum is normally positioned as is the ligament of Treitz. No small bowel wall thickening. No small bowel dilatation. The terminal ileum is normal. The appendix is normal. No gross colonic mass. No colonic wall thickening.  Vascular/Lymphatic: No abdominal aortic aneurysm. No abdominal aortic atherosclerotic calcification. There is no gastrohepatic or hepatoduodenal ligament lymphadenopathy. No retroperitoneal  or mesenteric lymphadenopathy. No pelvic sidewall lymphadenopathy.  Reproductive: IUD visualized in the uterus. There is no adnexal mass.  Other: No intraperitoneal free fluid.  Musculoskeletal: No worrisome lytic or sclerotic osseous abnormality.  IMPRESSION: 1. 4 x 4 x 6 mm stone identified at the left UPJ with minimal left hydronephrosis. Delayed imaging shows non dependent filling defect in calices of the left kidney which may be related to non dependent unopacified urine and/or clot. Follow-up may be warranted to ensure that this resolves. 2. Bilateral nonobstructing nephrolithiasis. 3. 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 Attention on follow-up recommended. 4. Tiny hiatal hernia.   Electronically Signed   By: Eric  Mansell M.D.   On: 12/29/2018 15:07    KUB, 01/10/2019: CLINICAL   DATA:  44 year old female with a history of kidney stones  EXAM: ABDOMEN - 1 VIEW  COMPARISON:  CT 12/29/2018  FINDINGS: Gas within small bowel and colon.  No abnormal distention.  No significant stool burden.  Surgical changes of cholecystectomy.  IUD within the anatomic pelvis.  Rounded calcific density projects inferior to the right twelfth rib, over the renal silhouette, compatible with findings on prior CT scan.  Rounded calcific density between the left L2-L3 transverse process, compatible with the proximal ureteral stone on prior CT.  Pelvic phleboliths.  No displaced fracture.  IMPRESSION: Rounded calcific density between the left L2-L3 transverse process, compatible with persisting proximal left ureteral stone.  Plain film demonstration of right-sided nephrolithiasis.   Electronically Signed   By: Corrie Mckusick D.O.   On: 01/10/2019 16:40  I personally reviewed the images  referenced above and note the presence and subsequent persistence of a 4 mm left UPJ stone.  Assessment & Plan:   1. Left ureteral stone 44 year old female presents today for management of a 4 mm left UPJ stone.  She is only mildly symptomatic of this.  UA significant for pyuria and microscopic hematuria; will send for culture and treat as indicated.    We discussed various treatment options for her stone including trial of passage vs. ESWL vs. ureteroscopy with laser lithotripsy and stent. We discussed the risks and benefits of each including bleeding, infection, damage to surrounding structures, efficacy with need for possible further intervention, and need for temporary ureteral stent.  We also discussed common symptoms associated with stent discomfort, including gross hematuria, flank pain, dysuria, urgency, and frequency.  I explained that this discomfort may continue for the duration of her stent being in place.  With a stone of this size, I estimate the chance of spontaneous passage at >50%.  I counseled her that I could represcribe generic Flomax today and initiate her on a trial of passage at this point.  However, she declines this and would like to proceed with ureteroscopy.    Booking sheet filled out.  She will not require medical clearance.  Counseled patient that our surgical scheduler, Amy, will contact her with further details.  Counseled patient to contact her office immediately if she develops fever, chills, nausea, vomiting, or acute worsening and flank pain.  She expressed understanding. - Urinalysis, Complete - CULTURE, URINE COMPREHENSIVE - Microscopic Examination  2. Renal cyst Postoperative follow-up will include 4-week renal ultrasound.  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  Return if symptoms worsen or fail to improve.  Debroah Loop, PA-C  [facility] [address], [place], [place] [address] [phone]

## 2019-01-11 ENCOUNTER — Other Ambulatory Visit: Payer: Self-pay | Admitting: Radiology

## 2019-01-11 DIAGNOSIS — N281 Cyst of kidney, acquired: Secondary | ICD-10-CM

## 2019-01-11 DIAGNOSIS — N201 Calculus of ureter: Secondary | ICD-10-CM

## 2019-01-13 ENCOUNTER — Other Ambulatory Visit: Payer: Self-pay

## 2019-01-13 ENCOUNTER — Encounter
Admission: RE | Admit: 2019-01-13 | Discharge: 2019-01-13 | Disposition: A | Discharge status: Home / Self Care | Payer: BC Managed Care – PPO | Source: Ambulatory Visit | Attending: Urology | Admitting: Urology

## 2019-01-13 HISTORY — DX: Family history of other specified conditions: Z84.89

## 2019-01-13 HISTORY — DX: Personal history of urinary calculi: Z87.442

## 2019-01-13 HISTORY — DX: Gastro-esophageal reflux disease without esophagitis: K21.9

## 2019-01-13 NOTE — Patient Instructions (Signed)
Your procedure is scheduled on: 01-17-19 TUESDAY Report to Same Day Surgery 2nd floor medical mall [facility] Entrance-take elevator on left to 2nd floor.  Check in with surgery information desk.) To find out your arrival time please call [phone] between 1PM - 3PM on 01-16-19 MONDAY   Remember: Instructions that are not followed completely may result in serious medical risk, up to and including death, or upon the discretion of your surgeon and anesthesiologist your surgery may need to be rescheduled.    _x___ 1. Do not eat food after midnight the night before your procedure. NO GUM OR CANDY AFTER MIDNIGHT. You may drink WATER up to 2 hours before you are scheduled to arrive at the hospital for your procedure.  Do not drink WATER  within 2 hours of your scheduled arrival to the hospital.  Type 1 and type 2 diabetics should only drink water.   ____Ensure clear carbohydrate drink on the way to the hospital for bariatric patients  ____Ensure clear carbohydrate drink 3 hours before surgery.     __x__ 2. No Alcohol for 24 hours before or after surgery.   __x__3. No Smoking or e-cigarettes for 24 prior to surgery.  Do not use any chewable tobacco products for at least 6 hour prior to surgery   ____  4. Bring all medications with you on the day of surgery if instructed.    __x__ 5. Notify your doctor if there is any change in your medical condition     (cold, fever, infections).    x___6. On the morning of surgery brush your teeth with toothpaste and water.  You may rinse your mouth with mouth wash if you wish.  Do not swallow any toothpaste or mouthwash.   Do not wear jewelry, make-up, hairpins, clips or nail polish.  Do not wear lotions, powders, or perfumes. You may wear deodorant.  Do not shave 48 hours prior to surgery. Men may shave face and neck.  Do not bring valuables to the hospital.    [facility] is not responsible for any belongings or valuables.               Contacts,  dentures or bridgework may not be worn into surgery.  Leave your suitcase in the car. After surgery it may be brought to your room.  For patients admitted to the hospital, discharge time is determined by your  treatment team.  _  Patients discharged the day of surgery will not be allowed to drive home.  You will need someone to drive you home and stay with you the night of your procedure.    Please read over the following fact sheets that you were given:   [facility] Preparing for Surgery   _x___ TAKE THE FOLLOWING MEDICATION THE MORNING OF SURGERY WITH A SMALL SIP OF WATER. These include:  1. BUSPAR (BUSPIRONE)  2. PRILOSEC (OMEPRAZOLE)  3. TAKE AN OMEPRAZOLE THE NIGHT BEFORE YOUR SURGERY  4.  5.  6.  ____Fleets enema or Magnesium Citrate as directed.   ____ Use CHG Soap or sage wipes as directed on instruction sheet   ____ Use inhalers on the day of surgery and bring to hospital day of surgery  _X___ Stop Metformin 2 days prior to surgery-LAST DOSE ON Saturday 01-14-19     _X___ Take 1/2 of usual insulin dose the night before surgery and none on the morning surgery-DO NOT TAKE YOUR LEVEMIR THE MORNING OF SURGERY  ____ Follow recommendations from Cardiologist,  Pulmonologist or PCP regarding stopping Aspirin, Coumadin, Plavix ,Eliquis, Effient, or Pradaxa, and Pletal.  X____Stop Anti-inflammatories such as Advil, Aleve, Ibuprofen, Motrin, Naproxen, Naprosyn, Goodies powders,BC BACK AND BODY or aspirin products NOW-OK to take Tylenol    _x___ Stop supplements until after surgery-STOP HAIR, SKIN AND NAILS NOW-MAY RESUME AFTER SURGERY   ____ Bring C-Pap to the hospital.

## 2019-01-14 LAB — CULTURE, URINE COMPREHENSIVE

## 2019-01-16 ENCOUNTER — Encounter
Admission: RE | Admit: 2019-01-16 | Discharge: 2019-01-16 | Disposition: A | Discharge status: Home / Self Care | Payer: BC Managed Care – PPO | Source: Ambulatory Visit | Attending: Urology | Admitting: Urology

## 2019-01-16 ENCOUNTER — Other Ambulatory Visit: Payer: BC Managed Care – PPO

## 2019-01-16 ENCOUNTER — Other Ambulatory Visit: Payer: Self-pay

## 2019-01-16 DIAGNOSIS — E119 Type 2 diabetes mellitus without complications: Secondary | ICD-10-CM | POA: Insufficient documentation

## 2019-01-16 DIAGNOSIS — Z20822 Contact with and (suspected) exposure to covid-19: Secondary | ICD-10-CM | POA: Insufficient documentation

## 2019-01-16 DIAGNOSIS — Z01818 Encounter for other preprocedural examination: Secondary | ICD-10-CM | POA: Insufficient documentation

## 2019-01-16 DIAGNOSIS — N201 Calculus of ureter: Secondary | ICD-10-CM | POA: Insufficient documentation

## 2019-01-16 LAB — BASIC METABOLIC PANEL
Anion gap: 8 (ref 5–15)
BUN: 12 mg/dL (ref 6–20)
CO2: 29 mmol/L (ref 22–32)
Calcium: 9.2 mg/dL (ref 8.9–10.3)
Chloride: 98 mmol/L (ref 98–111)
Creatinine, Ser: 0.79 mg/dL (ref 0.44–1.00)
GFR calc Af Amer: >60 mL/min (ref >60)
GFR calc non Af Amer: >60 mL/min (ref >60)
Glucose, Bld: 273 mg/dL — ABNORMAL HIGH (ref 70–99)
Potassium: 3.9 mmol/L (ref 3.5–5.1)
Sodium: 135 mmol/L (ref 135–145)

## 2019-01-16 LAB — CBC
HCT: 36.9 % (ref 36.0–46.0)
Hemoglobin: 11.9 g/dL — ABNORMAL LOW (ref 12.0–15.0)
MCH: 25.2 pg — ABNORMAL LOW (ref 26.0–34.0)
MCHC: 32.2 g/dL (ref 30.0–36.0)
MCV: 78.2 fL — ABNORMAL LOW (ref 80.0–100.0)
Platelets: 349 10*3/uL (ref 150–400)
RBC: 4.72 MIL/uL (ref 3.87–5.11)
RDW: 14 % (ref 11.5–15.5)
WBC: 9.2 10*3/uL (ref 4.0–10.5)
nRBC: 0 % (ref 0.0–0.2)

## 2019-01-16 LAB — SARS CORONAVIRUS 2 (TAT 6-24 HRS): SARS Coronavirus 2: NEGATIVE

## 2019-01-16 MED ORDER — CEFAZOLIN SODIUM-DEXTROSE 1-4 GM/50ML-% IV SOLN
1.0000 g | INTRAVENOUS | Status: AC
  Administered 2019-01-17: 14:00:00 1 g via INTRAVENOUS

## 2019-01-17 ENCOUNTER — Encounter: Payer: Self-pay | Admitting: Urology

## 2019-01-17 ENCOUNTER — Ambulatory Visit: Payer: BC Managed Care – PPO

## 2019-01-17 ENCOUNTER — Other Ambulatory Visit: Payer: Self-pay

## 2019-01-17 ENCOUNTER — Ambulatory Visit: Payer: BC Managed Care – PPO | Admitting: Certified Registered Nurse Anesthetist

## 2019-01-17 ENCOUNTER — Encounter
Admission: RE | Disposition: A | Discharge status: Home / Self Care | Payer: Self-pay | Source: Home / Self Care | Attending: Urology

## 2019-01-17 ENCOUNTER — Ambulatory Visit
Admission: RE | Admit: 2019-01-17 | Discharge: 2019-01-17 | Disposition: A | Discharge status: Home / Self Care | Payer: BC Managed Care – PPO | Attending: Urology | Admitting: Urology

## 2019-01-17 DIAGNOSIS — Z794 Long term (current) use of insulin: Secondary | ICD-10-CM | POA: Diagnosis not present

## 2019-01-17 DIAGNOSIS — Z87442 Personal history of urinary calculi: Secondary | ICD-10-CM | POA: Insufficient documentation

## 2019-01-17 DIAGNOSIS — N200 Calculus of kidney: Secondary | ICD-10-CM

## 2019-01-17 DIAGNOSIS — Z79899 Other long term (current) drug therapy: Secondary | ICD-10-CM | POA: Insufficient documentation

## 2019-01-17 DIAGNOSIS — F17210 Nicotine dependence, cigarettes, uncomplicated: Secondary | ICD-10-CM | POA: Diagnosis not present

## 2019-01-17 DIAGNOSIS — N201 Calculus of ureter: Secondary | ICD-10-CM | POA: Diagnosis not present

## 2019-01-17 DIAGNOSIS — E119 Type 2 diabetes mellitus without complications: Secondary | ICD-10-CM | POA: Insufficient documentation

## 2019-01-17 DIAGNOSIS — N281 Cyst of kidney, acquired: Secondary | ICD-10-CM | POA: Insufficient documentation

## 2019-01-17 DIAGNOSIS — F419 Anxiety disorder, unspecified: Secondary | ICD-10-CM | POA: Diagnosis not present

## 2019-01-17 HISTORY — PX: STONE EXTRACTION WITH BASKET: SHX5318

## 2019-01-17 HISTORY — PX: CYSTOSCOPY WITH RETROGRADE PYELOGRAM, URETEROSCOPY AND STENT PLACEMENT: SHX5789

## 2019-01-17 LAB — POCT PREGNANCY, URINE: Preg Test, Ur: NEGATIVE

## 2019-01-17 LAB — GLUCOSE, CAPILLARY
Glucose-Capillary: 175 mg/dL — ABNORMAL HIGH (ref 70–99)
Glucose-Capillary: 149 mg/dL — ABNORMAL HIGH (ref 70–99)
Glucose-Capillary: 161 mg/dL — ABNORMAL HIGH (ref 70–99)

## 2019-01-17 LAB — URINE DRUG SCREEN, QUALITATIVE (ARMC ONLY)
Amphetamines, Ur Screen: NOT DETECTED
Barbiturates, Ur Screen: NOT DETECTED
Benzodiazepine, Ur Scrn: NOT DETECTED
Cannabinoid 50 Ng, Ur ~~LOC~~: POSITIVE — AB
Cocaine Metabolite,Ur ~~LOC~~: NOT DETECTED
MDMA (Ecstasy)Ur Screen: NOT DETECTED
Methadone Scn, Ur: NOT DETECTED
Opiate, Ur Screen: NOT DETECTED
Phencyclidine (PCP) Ur S: NOT DETECTED
Tricyclic, Ur Screen: NOT DETECTED

## 2019-01-17 SURGERY — CYSTOURETEROSCOPY, WITH RETROGRADE PYELOGRAM AND STENT INSERTION
Anesthesia: General | Site: Ureter | Laterality: Left

## 2019-01-17 MED ORDER — IOHEXOL 180 MG/ML  SOLN
INTRAMUSCULAR | Status: DC | PRN
  Administered 2019-01-17: 14:00:00 20 mL

## 2019-01-17 MED ORDER — OXYBUTYNIN CHLORIDE 5 MG PO TABS: ORAL_TABLET | ORAL | 0 refills | Status: DC

## 2019-01-17 MED ORDER — FENTANYL CITRATE (PF) 100 MCG/2ML IJ SOLN
INTRAMUSCULAR | Status: AC
  Filled 2019-01-17: qty 2

## 2019-01-17 MED ORDER — CEFAZOLIN SODIUM-DEXTROSE 1-4 GM/50ML-% IV SOLN
INTRAVENOUS | Status: AC
  Filled 2019-01-17: qty 50

## 2019-01-17 MED ORDER — ACETAMINOPHEN 500 MG PO TABS
1000.0000 mg | ORAL_TABLET | Freq: Once | ORAL | Status: AC
  Administered 2019-01-17: 1000 mg via ORAL

## 2019-01-17 MED ORDER — FENTANYL CITRATE (PF) 100 MCG/2ML IJ SOLN
INTRAMUSCULAR | Status: AC
  Administered 2019-01-17: 25 ug via INTRAVENOUS
  Filled 2019-01-17: qty 2

## 2019-01-17 MED ORDER — LIDOCAINE HCL (PF) 2 % IJ SOLN
INTRAMUSCULAR | Status: AC
  Filled 2019-01-17: qty 5

## 2019-01-17 MED ORDER — ONDANSETRON HCL 4 MG/2ML IJ SOLN: 4.0000 mg | Freq: Once | INTRAMUSCULAR | Status: DC | PRN

## 2019-01-17 MED ORDER — PHENYLEPHRINE HCL (PRESSORS) 10 MG/ML IV SOLN
INTRAVENOUS | Status: DC | PRN
  Administered 2019-01-17 (×3): 100 ug via INTRAVENOUS

## 2019-01-17 MED ORDER — ESMOLOL HCL 100 MG/10ML IV SOLN
INTRAVENOUS | Status: DC | PRN
  Administered 2019-01-17: 10 ug via INTRAVENOUS

## 2019-01-17 MED ORDER — ACETAMINOPHEN 500 MG PO TABS
ORAL_TABLET | ORAL | Status: AC
  Filled 2019-01-17: qty 2

## 2019-01-17 MED ORDER — GLYCOPYRROLATE 0.2 MG/ML IJ SOLN
INTRAMUSCULAR | Status: DC | PRN
  Administered 2019-01-17: .2 mg via INTRAVENOUS

## 2019-01-17 MED ORDER — PROPOFOL 10 MG/ML IV BOLUS
INTRAVENOUS | Status: AC
  Filled 2019-01-17: qty 20

## 2019-01-17 MED ORDER — ONDANSETRON HCL 4 MG/2ML IJ SOLN
INTRAMUSCULAR | Status: AC
  Filled 2019-01-17: qty 2

## 2019-01-17 MED ORDER — FENTANYL CITRATE (PF) 100 MCG/2ML IJ SOLN
INTRAMUSCULAR | Status: DC | PRN
  Administered 2019-01-17: 10 ug via INTRAVENOUS
  Administered 2019-01-17 (×3): 25 ug via INTRAVENOUS
  Administered 2019-01-17: 15 ug via INTRAVENOUS

## 2019-01-17 MED ORDER — OXYBUTYNIN CHLORIDE 5 MG PO TABS
ORAL_TABLET | ORAL | Status: AC
  Administered 2019-01-17: 5 mg via ORAL
  Filled 2019-01-17: qty 1

## 2019-01-17 MED ORDER — OXYBUTYNIN CHLORIDE 5 MG PO TABS
5.0000 mg | ORAL_TABLET | Freq: Once | ORAL | Status: AC
  Filled 2019-01-17: qty 1

## 2019-01-17 MED ORDER — HYDROCODONE-ACETAMINOPHEN 5-325 MG PO TABS: 1.0000 | ORAL_TABLET | Freq: Four times a day (QID) | ORAL | 0 refills | Status: DC | PRN

## 2019-01-17 MED ORDER — MIDAZOLAM HCL 2 MG/2ML IJ SOLN
INTRAMUSCULAR | Status: AC
  Filled 2019-01-17: qty 2

## 2019-01-17 MED ORDER — LIDOCAINE HCL (CARDIAC) PF 100 MG/5ML IV SOSY
PREFILLED_SYRINGE | INTRAVENOUS | Status: DC | PRN
  Administered 2019-01-17: 100 mg via INTRAVENOUS

## 2019-01-17 MED ORDER — MIDAZOLAM HCL 2 MG/2ML IJ SOLN
INTRAMUSCULAR | Status: DC | PRN
  Administered 2019-01-17: 2 mg via INTRAVENOUS

## 2019-01-17 MED ORDER — FENTANYL CITRATE (PF) 100 MCG/2ML IJ SOLN
25.0000 ug | INTRAMUSCULAR | Status: DC | PRN
  Administered 2019-01-17 (×3): 25 ug via INTRAVENOUS

## 2019-01-17 MED ORDER — SODIUM CHLORIDE 0.9 % IV SOLN: INTRAVENOUS | Status: DC

## 2019-01-17 MED ORDER — DEXAMETHASONE SODIUM PHOSPHATE 10 MG/ML IJ SOLN
INTRAMUSCULAR | Status: DC | PRN
  Administered 2019-01-17: 5 mg via INTRAVENOUS

## 2019-01-17 MED ORDER — PROPOFOL 10 MG/ML IV BOLUS
INTRAVENOUS | Status: DC | PRN
  Administered 2019-01-17: 150 mg via INTRAVENOUS
  Administered 2019-01-17: 50 mg via INTRAVENOUS

## 2019-01-17 MED ORDER — ONDANSETRON HCL 4 MG/2ML IJ SOLN
4.0000 mg | Freq: Once | INTRAMUSCULAR | Status: AC
  Administered 2019-01-17: 4 mg via INTRAVENOUS

## 2019-01-17 SURGICAL SUPPLY — 28 items
BAG DRAIN CYSTO-URO LG1000N (MISCELLANEOUS) ×4 IMPLANT
BASKET ZERO TIP 1.9FR (BASKET) ×8 IMPLANT
BRUSH SCRUB EZ 1% IODOPHOR (MISCELLANEOUS) ×4 IMPLANT
CATH URETL 5X70 OPEN END (CATHETERS) IMPLANT
CNTNR SPEC 2.5X3XGRAD LEK (MISCELLANEOUS) ×2
CONT SPEC 4OZ STER OR WHT (MISCELLANEOUS) ×2
CONTAINER SPEC 2.5X3XGRAD LEK (MISCELLANEOUS) ×2 IMPLANT
DRAPE UTILITY 15X26 TOWEL STRL (DRAPES) ×4 IMPLANT
FIBER LASER TRACTIP 200 (UROLOGICAL SUPPLIES) ×4 IMPLANT
GLOVE BIO SURGEON STRL SZ8 (GLOVE) ×4 IMPLANT
GOWN STRL REUS W/ TWL LRG LVL3 (GOWN DISPOSABLE) ×2 IMPLANT
GOWN STRL REUS W/ TWL XL LVL3 (GOWN DISPOSABLE) ×2 IMPLANT
GOWN STRL REUS W/TWL LRG LVL3 (GOWN DISPOSABLE) ×2
GOWN STRL REUS W/TWL XL LVL3 (GOWN DISPOSABLE) ×2
GUIDEWIRE STR DUAL SENSOR (WIRE) ×8 IMPLANT
INFUSOR MANOMETER BAG 3000ML (MISCELLANEOUS) ×4 IMPLANT
INTRODUCER DILATOR DOUBLE (INTRODUCER) IMPLANT
KIT TURNOVER CYSTO (KITS) ×4 IMPLANT
PACK CYSTO AR (MISCELLANEOUS) ×4 IMPLANT
SET CYSTO W/LG BORE CLAMP LF (SET/KITS/TRAYS/PACK) ×4 IMPLANT
SHEATH URETERAL 12FRX35CM (MISCELLANEOUS) IMPLANT
SOL .9 NS 3000ML IRR  AL (IV SOLUTION) ×2
SOL .9 NS 3000ML IRR UROMATIC (IV SOLUTION) ×2 IMPLANT
STENT URET 6FRX24 CONTOUR (STENTS) ×4 IMPLANT
STENT URET 6FRX26 CONTOUR (STENTS) IMPLANT
SURGILUBE 2OZ TUBE FLIPTOP (MISCELLANEOUS) ×4 IMPLANT
VALVE UROSEAL ADJ ENDO (VALVE) ×4 IMPLANT
WATER STERILE IRR 1000ML POUR (IV SOLUTION) ×4 IMPLANT

## 2019-01-17 NOTE — Transfer of Care (Signed)
Immediate Anesthesia Transfer of Care Note  Patient: Rose [name]  Procedure(s) Performed: CYSTOSCOPY WITH RETROGRADE PYELOGRAM, URETEROSCOPY AND STENT PLACEMENT (Left Ureter) STONE EXTRACTION WITH BASKET (Left Ureter)  Patient Location: PACU  Anesthesia Type:General  Level of Consciousness: awake, drowsy, patient cooperative and responds to stimulation  Airway & Oxygen Therapy: Patient Spontanous Breathing and Patient connected to face mask oxygen  Post-op Assessment: Report given to RN and Post -op Vital signs reviewed and stable  Post vital signs: Reviewed and stable  Last Vitals:  Vitals Value Taken Time  BP    Temp    Pulse    Resp    SpO2      Last Pain:  Vitals:   01/17/19 1327  TempSrc:   PainSc: 5          Complications: No apparent anesthesia complications

## 2019-01-17 NOTE — Anesthesia Procedure Notes (Signed)
Procedure Name: LMA Insertion Performed by: Kelton Pillar, CRNA Pre-anesthesia Checklist: Patient identified, Emergency Drugs available, Suction available and Patient being monitored Patient Re-evaluated:Patient Re-evaluated prior to induction Preoxygenation: Pre-oxygenation with 100% oxygen Induction Type: IV induction Ventilation: Mask ventilation without difficulty LMA: LMA inserted LMA Size: 4.0 Number of attempts: 1 Placement Confirmation: positive ETCO2,  CO2 detector and breath sounds checked- equal and bilateral Tube secured with: Tape Dental Injury: Teeth and Oropharynx as per pre-operative assessment

## 2019-01-17 NOTE — Anesthesia Postprocedure Evaluation (Signed)
Anesthesia Post Note  Patient: [place] Dean Blount  Procedure(s) Performed: CYSTOSCOPY WITH RETROGRADE PYELOGRAM, URETEROSCOPY AND STENT PLACEMENT (Left Ureter) STONE EXTRACTION WITH BASKET (Left Ureter)  Patient location during evaluation: PACU Anesthesia Type: General Level of consciousness: awake and alert Pain management: pain level controlled Vital Signs Assessment: post-procedure vital signs reviewed and stable Respiratory status: spontaneous breathing and respiratory function stable Cardiovascular status: stable Anesthetic complications: no     Last Vitals:  Vitals:   01/17/19 1618 01/17/19 1637  BP:  (!) 145/89  Pulse: (!) 103 (!) 112  Resp: 15 18  Temp:  (!) 36.1 C  SpO2: 94% 96%    Last Pain:  Vitals:   01/17/19 1637  TempSrc: Temporal  PainSc: 5                  Zadia Uhde K

## 2019-01-17 NOTE — Progress Notes (Signed)
Patient called in to say that she feels like she is going to pass out and that she thinks her sugar level is dropping.  I asked if she had a glucometer and she stated "yes but I don't know where it is".  Scheduled for surgery today.  Asked patient if she had any apple juice, gatorade or other pulp free clear liquid.  Patient states the only thing that she has that she can take is orange juice with pulp.  Instructed patient to have a drink if she feels like she is going to pass out and have her driver bring her here to same day surgery now instead of waiting so we can monitor and treat her sugar levels.

## 2019-01-17 NOTE — OR Nursing (Signed)
Patient delayed for surgery because she drank juice this am at 8 am.  Dr Bertell Maria spoke with [facility] as well to discuss plan of care to proceed to surgery at 2 pm.

## 2019-01-17 NOTE — Op Note (Signed)
Preoperative diagnosis: Left proximal ureteral calculus  Postoperative diagnosis: Left renal calculus  Procedure:  1. Cystoscopy 2. Left ureteroscopy and stone removal 3. Ureteroscopic laser lithotripsy 4. Left ureteral stent placement (6FR/24 cm) 5. Left retrograde pyelography with interpretation  Surgeon: Nicki Reaper C. Warda Mcqueary, M.D.  Anesthesia: General  Complications: None  Intraoperative findings:  1.  Left retrograde pyelography post procedure showed no filling defects, stone fragments or contrast extravasation  EBL: Minimal  Specimens: 1. Calculus fragments for analysis   Indication: Rose [name] is a 44 y.o. female who recently presented with left flank pain and a 4 mm proximal ureteral calculus.  After discussing treatment options she requested ureteroscopic removal.  She continues with intermittent flank pain and is not aware of passing a calculus. After reviewing the management options for treatment, the patient elected to proceed with the above surgical procedure(s). We have discussed the potential benefits and risks of the procedure, side effects of the proposed treatment, the likelihood of the patient achieving the goals of the procedure, and any potential problems that might occur during the procedure or recuperation. Informed consent has been obtained.  Description of procedure:  The patient was taken to the operating room and general anesthesia was induced.  The patient was placed in the dorsal lithotomy position, prepped and draped in the usual sterile fashion, and preoperative antibiotics were administered. A preoperative time-out was performed.   A 21 French cystoscope was lubricated and passed per urethra.  Panendoscopy was performed and the bladder mucosa showed no erythema, solid or papillary lesions.  Attention was directed to the left ureteral orifice and a 0.038 Sensor wire was then advanced up the ureter into the renal pelvis under fluoroscopic  guidance.  A 4.5 Fr semirigid ureteroscope was then placed into the bladder.  The ureteroscope could not be placed directly into the orifice however was easily advanced over a second guidewire.  The ureteroscope was advanced proximally and the stone was not identified in the ureter.  There was an area of the proximal ureter with hyperemia and erythema.    A second guidewire was placed through the ureteroscope and the semirigid scope was removed.  A single channel digital flexible ureteroscope was placed over the working wire and was advanced without difficulty.  Once in the ureter the guidewire was removed and the ureteroscope was advanced to the renal pelvis.  Contrast was instilled through the ureteroscope and all calyces were examined.  The 4 mm calculus was identified in the lower pole calyx.  No other stones were identified.  The calculus was placed in a 1.9 French nitinol basket and was able to be withdrawn to the level of the UVJ where resistance was met.  The basket was disassembled and the ureteroscope was removed.  4.5 French semirigid ureteroscope was then placed in the distal ureter and the calculus was fragmented at a setting of 0.2 J / 10 Hz.  All fragments were removed with the nitinol basket.  Retrograde pyelogram was performed with findings as described above.  A 6 French/24 cm double-J ureteral stent with tether was then placed over the guidewire under fluoroscopic guidance.  Good positioning was noted proximally and distally under fluoroscopic guidance.  The stent tether was tied and cut shorter and tucked into the vagina.  After anesthetic reversal the patient was transported to the PACU in stable condition.   Plan: The patient was instructed to remove her ureteral stent on 01/20/2019.   John Giovanni, MD

## 2019-01-17 NOTE — Anesthesia Preprocedure Evaluation (Signed)
Anesthesia Evaluation  Patient identified by MRN, date of birth, ID band Patient awake  General Assessment Comment:Note: no family history of major anesthetic reactions (she says her sister was slow to wake up after anesthesia once)  Reviewed: Allergy & Precautions, NPO status , Patient's Chart, lab work & pertinent test results  History of Anesthesia Complications Negative for: history of anesthetic complications  Airway Mallampati: II  TM Distance: >3 FB Neck ROM: Full    Dental no notable dental hx. (+) Teeth Intact, Dental Advisory Given   Pulmonary neg pulmonary ROS, neg sleep apnea, neg COPD, Current Smoker and Patient abstained from smoking.,  Occasional smoker, last cigarette few weeks ago   Pulmonary exam normal breath sounds clear to auscultation       Cardiovascular Exercise Tolerance: Good METS(-) hypertension(-) CAD and (-) Past MI negative cardio ROS  (-) dysrhythmias  Rhythm:Regular Rate:Normal - Systolic murmurs    Neuro/Psych Anxiety negative neurological ROS  negative psych ROS   GI/Hepatic GERD  ,(+)     substance abuse  marijuana use,   Endo/Other  diabetes, Insulin Dependent  Renal/GU negative Renal ROS     Musculoskeletal   Abdominal   Peds  Hematology   Anesthesia Other Findings Past Medical History: No date: Anxiety No date: Diabetes mellitus without complication (HCC) No date: Family history of adverse reaction to anesthesia     Comment:  SISTER-TOO MUCH ANESTHESIA FOR C-SECTION No date: GERD (gastroesophageal reflux disease) No date: History of kidney stones No date: Neck pain No date: Right leg pain  Reproductive/Obstetrics                             Anesthesia Physical Anesthesia Plan  ASA: II  Anesthesia Plan: General   Post-op Pain Management:    Induction: Intravenous  PONV Risk Score and Plan: 3 and Ondansetron and Dexamethasone  Airway  Management Planned: LMA  Additional Equipment: None  Intra-op Plan:   Post-operative Plan: Extubation in OR  Informed Consent: I have reviewed the patients History and Physical, chart, labs and discussed the procedure including the risks, benefits and alternatives for the proposed anesthesia with the patient or authorized representative who has indicated his/her understanding and acceptance.     Dental advisory given  Plan Discussed with: CRNA and Surgeon  Anesthesia Plan Comments: (Discussed risks of anesthesia with patient, including PONV, sore throat, lip/dental damage. Rare risks discussed as well, such as cardiorespiratory sequelae. Patient understands.  Note: patient had Orangina drink (pulpy orange soda) at 8am due to feeling weak and hypoglycemic. Discussed with surgeon and will wait appropriate 6 hours of NPO time, until 2pm. Patient understands.)        Anesthesia Quick Evaluation

## 2019-01-17 NOTE — Interval H&P Note (Signed)
History and Physical Interval Note: She continues with intermittent left flank pain.  She is not aware of passing a stone.  The procedure was discussed in detail including potential risks of bleeding, infection, ureteral injury, ureteral stricture and inability to remove the stone due to inability to access the upper ureter with the ureteroscope.  Postoperative course was discussed including ureteral stent and possible stent symptoms.  She indicated all questions were answered and desires to proceed.  CV: RRR Lungs: Clear  01/17/2019 12:54 PM  Rose [name]  has presented today for surgery, with the diagnosis of left ureteropelvic junction stone.  The various methods of treatment have been discussed with the patient and family. After consideration of risks, benefits and other options for treatment, the patient has consented to  Procedure(s): CYSTOSCOPY/URETEROSCOPY/HOLMIUM LASER/STENT PLACEMENT (Left) as a surgical intervention.  The patient's history has been reviewed, patient examined, no change in status, stable for surgery.  I have reviewed the patient's chart and labs.  Questions were answered to the patient's satisfaction.     [place]

## 2019-01-17 NOTE — Discharge Instructions (Signed)
AMBULATORY SURGERY  DISCHARGE INSTRUCTIONS   1) The drugs that you were given will stay in your system until tomorrow so for the next 24 hours you should not:  A) Drive an automobile B) Make any legal decisions C) Drink any alcoholic beverage   2) You may resume regular meals tomorrow.  Today it is better to start with liquids and gradually work up to solid foods.  You may eat anything you prefer, but it is better to start with liquids, then soup and crackers, and gradually work up to solid foods.   3) Please notify your doctor immediately if you have any unusual bleeding, trouble breathing, redness and pain at the surgery site, drainage, fever, or pain not relieved by medication.    4) Additional Instructions:        Please contact your physician with any problems or Same Day Surgery at [phone], Monday through Friday 6 am to 4 pm, or [place] at [facility] number at [phone].DISCHARGE INSTRUCTIONS FOR KIDNEY STONE/URETERAL STENT   MEDICATIONS:  1. Resume all your other meds from home.  2.  AZO (over-the-counter) can help with the burning/stinging when you urinate. 3.  Hydrocodone is for moderate/severe pain, oxybutynin is for bladder irritation.  Rxs were sent to your pharmacy.  ACTIVITY:  1. May resume regular activities in 24 hours. 2. No driving while on narcotic pain medications  3. Drink plenty of water  4. Continue to walk at home - you can still get blood clots when you are at home, so keep active, but don't over do it.  5. May return to work/school tomorrow or when you feel ready   BATHING:  1. You can shower. 2. You have a string coming from your urethra: The stent string is attached to your ureteral stent. Do not pull on this.   SIGNS/SYMPTOMS TO CALL:  Please call us if you have a fever greater than 101.5, uncontrolled nausea/vomiting, uncontrolled pain, dizziness, unable to urinate, bloody urine, chest pain, shortness of breath, leg swelling,  leg pain, or any other concerns or questions.   You can reach [place] at [phone].   FOLLOW-UP:  1. You will be contacted regarding postop follow-up. 2. You have a string attached to your stent, you may remove it on Friday, 01/20/2019. To do this, pull the string until the stent is completely removed. You may feel an odd sensation in your back.  If you do not feel comfortable removing the stent yourself then contact the office and an appointment will be arranged.

## 2019-01-17 NOTE — OR Nursing (Addendum)
Patient vomited 27ml of gastric contents.  Patient states she does this some when she has a headache. Denies fever or respiratory symptoms. Dr Bertell Maria with anesthesia in to see patient and aware.

## 2019-01-19 NOTE — Progress Notes (Signed)
01/20/2019 8:50 AM   Rose [name] [birth year] [id number]  Referring provider: Department, [facility] [address] [place],  [place] [id number]  Chief Complaint  Patient presents with  . Nephrolithiasis    HPI: Rose [name] is a 44 year old female who is status post left URS/LL/ureteral stent placement on 01/17/2019 with Dr. Bernardo Heater for a 6 mm left UPJ stone.    Her post operative course was as expected and uneventful.    Today, she reports to have her ureteral stent manually removed.    CT Urogram 12/29/2018 1. 4 x 4 x 6 mm stone identified at the left UPJ with minimal left hydronephrosis. Delayed imaging shows non dependent filling defect in calices of the left kidney which may be related to non dependent unopacified urine and/or clot. Follow-up may be warranted to ensure that this resolves - left retrograde performed 01/17/2019 NED 2. Bilateral nonobstructing nephrolithiasis. 3. 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 Attention on follow-up recommended - RUS pending 4. Tiny hiatal hernia.   PMH: Past Medical History:  Diagnosis Date  . Anxiety   . Diabetes mellitus without complication ([place])   . Family history of adverse reaction to anesthesia    SISTER-TOO MUCH ANESTHESIA FOR C-SECTION  . GERD (gastroesophageal reflux disease)   . History of kidney stones   . Neck pain   . Right leg pain     Surgical History: Past Surgical History:  Procedure Laterality Date  . CESAREAN SECTION    . CHOLECYSTECTOMY    . CYSTOSCOPY WITH RETROGRADE PYELOGRAM, URETEROSCOPY AND STENT PLACEMENT Left 01/17/2019   Procedure: CYSTOSCOPY WITH RETROGRADE PYELOGRAM, URETEROSCOPY AND STENT PLACEMENT;  Surgeon: Abbie Sons, MD;  Location: [facility];  Service: Urology;  Laterality: Left;   . LAPAROSCOPY N/A 08/24/2013   Procedure: LAPAROSCOPY OPERATIVE with lysis of adhesions ;  Surgeon: Marylynn Pearson, MD;  Location: [facility];  Service: Gynecology;  Laterality: N/A;  . STONE EXTRACTION WITH BASKET Left 01/17/2019   Procedure: STONE EXTRACTION WITH BASKET;  Surgeon: Abbie Sons, MD;  Location: [facility];  Service: Urology;  Laterality: Left;  . wrist, cyst removal      Home Medications:  Allergies as of 01/20/2019      Reactions   Bactrim [sulfamethoxazole-trimethoprim] Swelling   Neurontin [gabapentin] Itching      Medication List       Accurate as of January 20, 2019  8:50 AM. If you have any questions, ask your nurse or doctor.        Adzenys XR-ODT 15.7 MG Tbed Generic drug: Amphetamine ER Take 15.7 mg by mouth daily.   BAYER BACK & BODY PO Take 1 tablet by mouth daily as needed (pain).   busPIRone 30 MG tablet Commonly known as: BUSPAR Take 30 mg by mouth 2 (two) times daily.   cholecalciferol 25 MCG (1000 UNIT) tablet Commonly known as: VITAMIN D3 Take 1,000 Units by mouth daily.   diphenhydrAMINE 25 MG tablet Commonly known as: BENADRYL Take 50 mg by mouth daily as needed for allergies.   diphenhydramine-acetaminophen 25-500 MG Tabs tablet Commonly known as: TYLENOL PM Take 1 tablet by mouth at bedtime as needed (sleep).   glucose blood test strip Commonly known as: ONE TOUCH ULTRA TEST 1 each by Other route daily. And lancets 1/day  250.00   Hair/Skin/Nails Caps Take 1 capsule by mouth daily.   HYDROcodone-acetaminophen 5-325 MG tablet Commonly known as: NORCO/VICODIN Take 1 tablet by mouth every 6 (six) hours as needed for moderate pain.   insulin detemir 100 UNIT/ML injection Commonly known as: LEVEMIR Inject 40 Units into the skin every morning.   IRON-C PO Take 1 tablet by mouth daily.   ketorolac 10 MG tablet Commonly known as: TORADOL Take 1 tablet (10 mg total) by mouth every 6 (six) hours as needed. Started by: Zara Council, PA-C   levonorgestrel 20 MCG/24HR IUD Commonly known as: MIRENA 1 each by Intrauterine route once.   LUBRICATING EYE DROPS OP Place 1 drop into both eyes daily as needed (dry eyes).   lurasidone 40 MG Tabs tablet Commonly known as: LATUDA Take 20 mg by mouth every evening.   metFORMIN 1000 MG tablet Commonly known as: GLUCOPHAGE Take 1,000 mg by mouth 2 (two) times daily.   naproxen sodium 220 MG tablet Commonly known as: ALEVE Take 220 mg by mouth daily as needed (pain).   omeprazole 20 MG capsule Commonly known as: PRILOSEC Take 20 mg by mouth as needed.   oxybutynin 5 MG tablet Commonly known as: DITROPAN 1 tab tid prn frequency,urgency, bladder spasm   pyridOXINE 100 MG tablet Commonly known as: VITAMIN B-6 Take 100 mg by mouth daily.   QUEtiapine 50 MG tablet Commonly known as: SEROQUEL Take 75 mg by mouth at bedtime.   simvastatin 20 MG tablet Commonly known as: ZOCOR Take 20 mg by mouth daily at 6 PM.       Allergies:  Allergies  Allergen Reactions  . Bactrim [Sulfamethoxazole-Trimethoprim] Swelling  . Neurontin [Gabapentin] Itching    Family History: Family History  Problem Relation Age of Onset  . Aneurysm Mother   . Blindness Father   . High blood pressure Father   . Diabetes Neg Hx     Social History:  reports that she has quit smoking. Her smoking use included cigarettes. She quit after 1.00 year of use. She has never used smokeless tobacco. She reports current alcohol use of about 1.0 standard drinks of alcohol per week. She reports current drug use. Drug: Marijuana.  ROS: UROLOGY Frequent Urination?: No Hard to postpone urination?: No Burning/pain with urination?: No Get up at night to urinate?: No Leakage of urine?: No Urine stream starts and stops?: No Trouble starting stream?: No Do you have to strain to urinate?: No Blood in urine?: No Urinary tract infection?: No Sexually transmitted disease?: No Injury to kidneys  or bladder?: No Painful intercourse?: No Weak stream?: No Currently pregnant?: No Vaginal bleeding?: No Last menstrual period?: n  Gastrointestinal Nausea?: No Vomiting?: No Indigestion/heartburn?: No Diarrhea?: No Constipation?: No  Constitutional Fever: No Night sweats?: No Weight loss?: No Fatigue?: No  Skin Skin rash/lesions?: No Itching?: No  Eyes Blurred vision?: No Double vision?: No  Ears/Nose/Throat Sore throat?: No Sinus problems?: No  Hematologic/Lymphatic Swollen glands?: No Easy bruising?: No  Cardiovascular Leg swelling?: No Chest pain?: No  Respiratory Cough?: No Shortness of breath?: No  Endocrine Excessive thirst?: No  Musculoskeletal Back pain?: No Joint pain?: No  Neurological Headaches?: No Dizziness?: No  Psychologic Depression?: No Anxiety?: Yes  Physical Exam: BP 118/86   Pulse (!) 111   Ht 5\' 6"  (1.676 m)   Wt 172 lb 9.6 oz (78.3 kg)   BMI 27.86 kg/m   Constitutional:  Well nourished. Alert and oriented, No acute distress. HEENT: [place]  AT, moist mucus membranes.  Trachea midline, no masses. Cardiovascular: No clubbing, cyanosis, or edema. Respiratory: Normal respiratory effort, no increased work of breathing. Neurologic: Grossly intact, no focal deficits, moving all 4 extremities. Psychiatric: Normal mood and affect.  Laboratory Data: Lab Results  Component Value Date   WBC 9.2 01/16/2019   HGB 11.9 (L) 01/16/2019   HCT 36.9 01/16/2019   MCV 78.2 (L) 01/16/2019   PLT 349 01/16/2019    Lab Results  Component Value Date   CREATININE 0.79 01/16/2019    No results found for: PSA  No results found for: TESTOSTERONE  Lab Results  Component Value Date   HGBA1C 9.1 (H) 09/18/2013    Lab Results  Component Value Date   TSH 2.080 03/26/2017       Component Value Date/Time   CHOL 167 03/26/2017 1545   HDL 43 03/26/2017 1545   CHOLHDL 3.9 03/26/2017 1545   CHOLHDL 3.1 Ratio 01/13/2008 2035   VLDL 11  01/13/2008 2035   LDLCALC 105 (H) 03/26/2017 1545    Lab Results  Component Value Date   AST 17 05/24/2018   Lab Results  Component Value Date   ALT 14 05/24/2018   No components found for: ALKALINEPHOPHATASE No components found for: BILIRUBINTOTAL  No results found for: ESTRADIOL  Urinalysis    Component Value Date/Time   COLORURINE Amber 12/28/2013 0225   APPEARANCEUR Cloudy (A) 01/10/2019 1337   LABSPEC 1.044 12/28/2013 0225   PHURINE 5.0 12/28/2013 0225   PHURINE 7.0 11/25/2006 1018   GLUCOSEU Negative 01/10/2019 1337   GLUCOSEU Negative 12/28/2013 0225   HGBUR Negative 12/28/2013 0225   HGBUR TRACE (A) 11/25/2006 1018   BILIRUBINUR Negative 01/10/2019 1337   BILIRUBINUR Negative 12/28/2013 0225   KETONESUR Trace 12/28/2013 0225   KETONESUR 15 (A) 11/25/2006 1018   PROTEINUR Trace (A) 01/10/2019 1337   PROTEINUR 100 mg/dL 12/28/2013 0225   PROTEINUR 30 (A) 11/25/2006 1018   UROBILINOGEN 1.0 11/25/2006 1018   NITRITE Negative 01/10/2019 1337   NITRITE Negative 12/28/2013 0225   NITRITE NEGATIVE 11/25/2006 1018   LEUKOCYTESUR Trace (A) 01/10/2019 1337   LEUKOCYTESUR Negative 12/28/2013 0225    I have reviewed the labs.    Assessment & Plan:    1. Left UPJ stone -s/p left URS/LL/ureteral stent placement -ureteral stent removed today without issue -Toradol 10 mg, q 6 hours prn for ureteral spasms #20 and also advised to soak in a hot tub of water if she should experience ureteral spasms  2. Hydronephrosis RUS pending Reviewed the importance of keeping her RUS appointment to rule out silent hydronephrosis and to further evaluate the left renal lesion to confirm it is a benign cyst vs a malignancy  3. Gross hematuria Likely due to stone, but will continue to monitor  4. 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 02/08 and 02/11 appt with Sam .  These notes generated with voice recognition software. I apologize  for typographical errors.  Zara Council, PA-C  [facility] [address]  [place], [place] [address] [phone]

## 2019-01-20 ENCOUNTER — Encounter: Payer: Self-pay | Admitting: Urology

## 2019-01-20 ENCOUNTER — Other Ambulatory Visit: Payer: Self-pay

## 2019-01-20 ENCOUNTER — Ambulatory Visit (INDEPENDENT_AMBULATORY_CARE_PROVIDER_SITE_OTHER): Payer: BC Managed Care – PPO | Admitting: Urology

## 2019-01-20 VITALS — BP 118/86 | HR 111 | Ht 66.0 in | Wt 172.6 lb

## 2019-01-20 DIAGNOSIS — N201 Calculus of ureter: Secondary | ICD-10-CM

## 2019-01-20 DIAGNOSIS — N281 Cyst of kidney, acquired: Secondary | ICD-10-CM

## 2019-01-20 DIAGNOSIS — N132 Hydronephrosis with renal and ureteral calculous obstruction: Secondary | ICD-10-CM

## 2019-01-20 DIAGNOSIS — R310 Gross hematuria: Secondary | ICD-10-CM

## 2019-01-20 MED ORDER — KETOROLAC TROMETHAMINE 10 MG PO TABS: 10.0000 mg | ORAL_TABLET | Freq: Four times a day (QID) | ORAL | 0 refills | Status: DC | PRN

## 2019-01-23 ENCOUNTER — Other Ambulatory Visit: Payer: BC Managed Care – PPO | Admitting: Urology

## 2019-01-23 LAB — CALCULI, WITH PHOTOGRAPH (CLINICAL LAB)
Calcium Oxalate Dihydrate: 50 %
Calcium Oxalate Monohydrate: 50 %
Weight Calculi: 10 mg

## 2019-01-25 ENCOUNTER — Telehealth: Payer: Self-pay | Admitting: *Deleted

## 2019-01-25 NOTE — Telephone Encounter (Signed)
-----   Message from Abbie Sons, MD sent at 01/24/2019  7:59 PM EST ----- Joaquim Lai analysis was mixed calcium oxalate which is the most common type of stone.  She has a follow-up scheduled next month with Sam.

## 2019-01-25 NOTE — Telephone Encounter (Signed)
Notified patient as instructed, patient pleased. Discussed follow-up appointments, patient agrees  

## 2019-02-13 ENCOUNTER — Ambulatory Visit
Admission: RE | Admit: 2019-02-13 | Discharge: 2019-02-13 | Disposition: A | Discharge status: Home / Self Care | Payer: BC Managed Care – PPO | Source: Ambulatory Visit | Attending: Physician Assistant | Admitting: Physician Assistant

## 2019-02-13 ENCOUNTER — Other Ambulatory Visit: Payer: Self-pay

## 2019-02-13 DIAGNOSIS — N201 Calculus of ureter: Secondary | ICD-10-CM | POA: Diagnosis present

## 2019-02-13 DIAGNOSIS — N281 Cyst of kidney, acquired: Secondary | ICD-10-CM | POA: Insufficient documentation

## 2019-02-16 ENCOUNTER — Ambulatory Visit (INDEPENDENT_AMBULATORY_CARE_PROVIDER_SITE_OTHER): Payer: BC Managed Care – PPO | Admitting: Physician Assistant

## 2019-02-16 ENCOUNTER — Other Ambulatory Visit: Payer: Self-pay

## 2019-02-16 VITALS — BP 136/88 | HR 111 | Ht 64.0 in | Wt 172.0 lb

## 2019-02-16 DIAGNOSIS — N281 Cyst of kidney, acquired: Secondary | ICD-10-CM

## 2019-02-16 DIAGNOSIS — N201 Calculus of ureter: Secondary | ICD-10-CM

## 2019-02-16 DIAGNOSIS — R3129 Other microscopic hematuria: Secondary | ICD-10-CM

## 2019-02-16 LAB — URINALYSIS, COMPLETE
Bilirubin, UA: NEGATIVE
Glucose, UA: NEGATIVE
Nitrite, UA: NEGATIVE
Protein,UA: NEGATIVE
RBC, UA: NEGATIVE
Specific Gravity, UA: 1.025 (ref 1.005–1.030)
Urobilinogen, Ur: 0.2 mg/dL (ref 0.2–1.0)
pH, UA: 7 (ref 5.0–7.5)

## 2019-02-16 LAB — MICROSCOPIC EXAMINATION
Bacteria, UA: NONE SEEN
RBC, Urine: NONE SEEN /hpf (ref 0–2)

## 2019-02-16 NOTE — Patient Instructions (Signed)
Dietary Guidelines to Help Prevent Kidney Stones Kidney stones are deposits of minerals and salts that form inside your kidneys. Your risk of developing kidney stones may be greater depending on your diet, your lifestyle, the medicines you take, and whether you have certain medical conditions. Most people can reduce their chances of developing kidney stones by following the instructions below. Depending on your overall health and the type of kidney stones you tend to develop, your dietitian may give you more specific instructions. What are tips for following this plan? Reading food labels  Choose foods with "no salt added" or "low-salt" labels. Limit your sodium intake to less than 1500 mg per day.  Choose foods with calcium for each meal and snack. Try to eat about 300 mg of calcium at each meal. Foods that contain 200-500 mg of calcium per serving include: ? 8 oz (237 ml) of milk, fortified nondairy milk, and fortified fruit juice. ? 8 oz (237 ml) of kefir, yogurt, and soy yogurt. ? 4 oz (118 ml) of tofu. ? 1 oz of cheese. ? 1 cup (300 g) of dried figs. ? 1 cup (91 g) of cooked broccoli. ? 1-3 oz can of sardines or mackerel.  Most people need 1000 to 1500 mg of calcium each day. Talk to your dietitian about how much calcium is recommended for you. Shopping  Buy plenty of fresh fruits and vegetables. Most people do not need to avoid fruits and vegetables, even if they contain nutrients that may contribute to kidney stones.  When shopping for convenience foods, choose: ? Whole pieces of fruit. ? Premade salads with dressing on the side. ? Low-fat fruit and yogurt smoothies.  Avoid buying frozen meals or prepared deli foods.  Look for foods with live cultures, such as yogurt and kefir. Cooking  Do not add salt to food when cooking. Place a salt shaker on the table and allow each person to add his or her own salt to taste.  Use vegetable protein, such as beans, textured vegetable  protein (TVP), or tofu instead of meat in pasta, casseroles, and soups. Meal planning   Eat less salt, if told by your dietitian. To do this: ? Avoid eating processed or premade food. ? Avoid eating fast food.  Eat less animal protein, including cheese, meat, poultry, or fish, if told by your dietitian. To do this: ? Limit the number of times you have meat, poultry, fish, or cheese each week. Eat a diet free of meat at least 2 days a week. ? Eat only one serving each day of meat, poultry, fish, or seafood. ? When you prepare animal protein, cut pieces into small portion sizes. For most meat and fish, one serving is about the size of one deck of cards.  Eat at least 5 servings of fresh fruits and vegetables each day. To do this: ? Keep fruits and vegetables on hand for snacks. ? Eat 1 piece of fruit or a handful of berries with breakfast. ? Have a salad and fruit at lunch. ? Have two kinds of vegetables at dinner.  Limit foods that are high in a substance called oxalate. These include: ? Spinach. ? Rhubarb. ? Beets. ? Potato chips and french fries. ? Nuts.  If you regularly take a diuretic medicine, make sure to eat at least 1-2 fruits or vegetables high in potassium each day. These include: ? Avocado. ? Banana. ? Orange, prune, carrot, or tomato juice. ? Baked potato. ? Cabbage. ? Beans and split   peas. General instructions   Drink enough fluid to keep your urine clear or pale yellow. This is the most important thing you can do.  Talk to your health care provider and dietitian about taking daily supplements. Depending on your health and the cause of your kidney stones, you may be advised: ? Not to take supplements with vitamin C. ? To take a calcium supplement. ? To take a daily probiotic supplement. ? To take other supplements such as magnesium, fish oil, or vitamin B6.  Take all medicines and supplements as told by your health care provider.  Limit alcohol intake to no  more than 1 drink a day for nonpregnant women and 2 drinks a day for men. One drink equals 12 oz of beer, 5 oz of wine, or 1 oz of hard liquor.  Lose weight if told by your health care provider. Work with your dietitian to find strategies and an eating plan that works best for you. What foods are not recommended? Limit your intake of the following foods, or as told by your dietitian. Talk to your dietitian about specific foods you should avoid based on the type of kidney stones and your overall health. Grains Breads. Bagels. Rolls. Baked goods. Salted crackers. Cereal. Pasta. Vegetables Spinach. Rhubarb. Beets. Canned vegetables. Pickles. Olives. Meats and other protein foods Nuts. Nut butters. Large portions of meat, poultry, or fish. Salted or cured meats. Deli meats. Hot dogs. Sausages. Dairy Cheese. Beverages Regular soft drinks. Regular vegetable juice. Seasonings and other foods Seasoning blends with salt. Salad dressings. Canned soups. Soy sauce. Ketchup. Barbecue sauce. Canned pasta sauce. Casseroles. Pizza. Lasagna. Frozen meals. Potato chips. French fries. Summary  You can reduce your risk of kidney stones by making changes to your diet.  The most important thing you can do is drink enough fluid. You should drink enough fluid to keep your urine clear or pale yellow.  Ask your health care provider or dietitian how much protein from animal sources you should eat each day, and also how much salt and calcium you should have each day. This information is not intended to replace advice given to you by your health care provider. Make sure you discuss any questions you have with your health care provider. Document Revised: 04/13/2018 Document Reviewed: 12/03/2015 Elsevier Patient Education  2020 [employer]  

## 2019-02-16 NOTE — Progress Notes (Signed)
02/16/2019 9:06 AM   Rose Dean Hakanson 31-Aug-1975 [id number]  CC: Postop URS/LL follow-up  HPI: Rose [name] is a 44 y.o. female who presents today for 4-week follow-up of cystoscopy and left ureteroscopy with laser lithotripsy and stent placement for management of a 4 x 6 mm proximal left ureteral stone with a recent history of microscopic hematuria.  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  No significant intraoperative findings.  She returned to clinic on POD 3 for hand removal of left ureteral stent, no complications noted.  Follow-up renal ultrasound on 02/13/2019 revealed resolution of left hydronephrosis.  Her previously seen septated likely Bosniak 2 left renal cyst was again seen, but unable to be further characterized.  Repeat ultrasound recommended.  Stone analysis resulted with 50% calcium oxalate monohydrate, 50% calcium oxalate dihydrate.  Per CT hematuria on 12/29/2018, she is believed to have 2 residual nonobstructing renal stones: one 6 mm interpolar right renal stone and one 2 mm left lower pole stone.   Today, she reports feeling well.  No acute concerns.  She denies flank pain or gross hematuria.  In-office UA today positive for trace leukocyte esterase; urine microscopy pan negative.   PMH: Past Medical History:  Diagnosis Date  . Anxiety   . Diabetes mellitus without complication ([place])   . Family history of adverse reaction to anesthesia    SISTER-TOO MUCH ANESTHESIA FOR C-SECTION  . GERD (gastroesophageal reflux disease)   . History of kidney stones   . Neck pain   . Right leg pain     Surgical History: Past Surgical History:  Procedure Laterality Date  . CESAREAN SECTION    . CHOLECYSTECTOMY    . CYSTOSCOPY WITH RETROGRADE PYELOGRAM, URETEROSCOPY AND STENT PLACEMENT Left 01/17/2019   Procedure: CYSTOSCOPY  WITH RETROGRADE PYELOGRAM, URETEROSCOPY AND STENT PLACEMENT;  Surgeon: Abbie Sons, MD;  Location: [facility];  Service: Urology;  Laterality: Left;  . LAPAROSCOPY N/A 08/24/2013   Procedure: LAPAROSCOPY OPERATIVE with lysis of adhesions ;  Surgeon: Marylynn Pearson, MD;  Location: [facility];  Service: Gynecology;  Laterality: N/A;  . STONE EXTRACTION WITH BASKET Left 01/17/2019   Procedure: STONE EXTRACTION WITH BASKET;  Surgeon: Abbie Sons, MD;  Location: [facility];  Service: Urology;  Laterality: Left;  . wrist, cyst removal      Home Medications:  Allergies as of 02/16/2019      Reactions   Bactrim [sulfamethoxazole-trimethoprim] Swelling   Neurontin [gabapentin] Itching      Medication List       Accurate as of February 16, 2019  9:06 AM. If you have any questions, ask your nurse or doctor.        Adzenys XR-ODT 15.7 MG Tbed Generic drug: Amphetamine ER Take 15.7 mg by mouth daily.   BAYER BACK & BODY PO Take 1 tablet by mouth daily as needed (pain).   busPIRone 30 MG tablet Commonly known as: BUSPAR Take 30 mg by mouth 2 (two) times daily.   cholecalciferol 25 MCG (1000 UNIT) tablet Commonly known as: VITAMIN D3 Take 1,000 Units by mouth daily.   diphenhydrAMINE 25 MG tablet Commonly known as: BENADRYL Take 50 mg by mouth daily as needed for allergies.   diphenhydramine-acetaminophen 25-500 MG Tabs tablet Commonly known as: TYLENOL PM Take 1 tablet by mouth at bedtime as needed (sleep).   glucose blood  test strip Commonly known as: ONE TOUCH ULTRA TEST 1 each by Other route daily. And lancets 1/day 250.00   Hair/Skin/Nails Caps Take 1 capsule by mouth daily.   HYDROcodone-acetaminophen 5-325 MG tablet Commonly known as: NORCO/VICODIN Take 1 tablet by mouth every 6 (six) hours as needed for moderate pain.   insulin detemir 100 UNIT/ML injection Commonly known as: LEVEMIR Inject 40 Units into the skin every morning.   IRON-C PO Take 1 tablet by mouth  daily.   ketorolac 10 MG tablet Commonly known as: TORADOL Take 1 tablet (10 mg total) by mouth every 6 (six) hours as needed.   levonorgestrel 20 MCG/24HR IUD Commonly known as: MIRENA 1 each by Intrauterine route once.   LUBRICATING EYE DROPS OP Place 1 drop into both eyes daily as needed (dry eyes).   lurasidone 40 MG Tabs tablet Commonly known as: LATUDA Take 20 mg by mouth every evening.   metFORMIN 1000 MG tablet Commonly known as: GLUCOPHAGE Take 1,000 mg by mouth 2 (two) times daily.   naproxen sodium 220 MG tablet Commonly known as: ALEVE Take 220 mg by mouth daily as needed (pain).   omeprazole 20 MG capsule Commonly known as: PRILOSEC Take 20 mg by mouth as needed.   oxybutynin 5 MG tablet Commonly known as: DITROPAN 1 tab tid prn frequency,urgency, bladder spasm   pyridOXINE 100 MG tablet Commonly known as: VITAMIN B-6 Take 100 mg by mouth daily.   QUEtiapine 50 MG tablet Commonly known as: SEROQUEL Take 75 mg by mouth at bedtime.   simvastatin 20 MG tablet Commonly known as: ZOCOR Take 20 mg by mouth daily at 6 PM.       Allergies:  Allergies  Allergen Reactions  . Bactrim [Sulfamethoxazole-Trimethoprim] Swelling  . Neurontin [Gabapentin] Itching    Family History: Family History  Problem Relation Age of Onset  . Aneurysm Mother   . Blindness Father   . High blood pressure Father   . Diabetes Neg Hx     Social History:   reports that she has quit smoking. Her smoking use included cigarettes. She quit after 1.00 year of use. She has never used smokeless tobacco. She reports current alcohol use of about 1.0 standard drinks of alcohol per week. She reports current drug use. Drug: Marijuana.  Physical Exam: BP 136/88   Pulse (!) 111   Ht 5\' 4"  (1.626 m)   Wt 172 lb (78 kg)   BMI 29.52 kg/m   Constitutional:  Alert and oriented, no acute distress, nontoxic appearing HEENT: [place], AT Cardiovascular: No clubbing, cyanosis, or  edema Respiratory: Normal respiratory effort, no increased work of breathing Skin: No rashes, bruises or suspicious lesions Neurologic: Grossly intact, no focal deficits, moving all 4 extremities Psychiatric: Normal mood and affect  Laboratory Data: Results for orders placed or performed in visit on 02/16/19  Microscopic Examination   URINE  Result Value Ref Range   WBC, UA 0-5 0 - 5 /hpf   RBC None seen 0 - 2 /hpf   Epithelial Cells (non renal) WILL FOLLOW    Bacteria, UA None seen None seen/Few  Urinalysis, Complete  Result Value Ref Range   Specific Gravity, UA 1.025 1.005 - 1.030   pH, UA 7.0 5.0 - 7.5   Color, UA Yellow Yellow   Appearance Ur Cloudy (A) Clear   Leukocytes,UA Trace (A) Negative   Protein,UA Negative Negative/Trace   Glucose, UA Negative Negative   Ketones, UA Trace (A) Negative   RBC,  UA Negative Negative   Bilirubin, UA Negative Negative   Urobilinogen, Ur 0.2 0.2 - 1.0 mg/dL   Nitrite, UA Negative Negative   Microscopic Examination See below:    Pertinent Imaging: Results for orders placed during the hospital encounter of 02/13/19  Ultrasound renal complete   Narrative CLINICAL DATA:  44 year old female with left renal cyst and left ureteral stone.  EXAM: RENAL / URINARY TRACT ULTRASOUND COMPLETE  COMPARISON:  CT of the abdomen pelvis dated 12/29/2018.  FINDINGS: Right Kidney:  Renal measurements: 10.7 x 6.7 x 5.1 cm = volume: 193 mL. Normal echogenicity. There is an 8 mm nonobstructive stone in the interpolar aspect of the right kidney. No hydronephrosis.  Left Kidney:  Renal measurements: 11.3 x 5.8 x 5.5 cm = volume: 188 mL. Normal echogenicity. No hydronephrosis or shadowing stone. There is a 1.3 x 1.3 x 1.6 cm septated cyst in the lower pole of the left kidney. No flow identified in the septation. Follow-up with ultrasound in 3-6 months recommended.  Bladder:  Appears normal for degree of bladder  distention.  Other:  None.  IMPRESSION: 1. An 8 mm right renal calculus.  No hydronephrosis. 2. A septated left renal cyst. Follow-up with ultrasound recommended.   Electronically Signed   By: Anner Crete M.D.   On: 02/13/2019 20:32    I personally reviewed the images referenced above and note resolution of left hydronephrosis as well as a septated left renal cyst.  Assessment & Plan:   1. Renal cyst, left Incompletely characterized in follow-up renal ultrasound, agree with likely Bosniak II characterization.  Recommend repeat ultrasound in 6 months.  Patient expressed understanding. - US RENAL; Future  2. Left ureteral stone Pain and hydronephrosis resolved.  No further intervention indicated.  I counseled the patient on general stone prevention techniques including increasing water intake with a goal of producing 2.5L of urine daily, increased citric acid intake, avoidance of high oxalate-containing foods, and decreasing salt intake.  Written information about dietary recommendations given today including an ABCs of Kidney Stones booklet.    3. Microscopic hematuria Resolved on UA today.  Believed secondary to ureteral stone, now resolved as above. - Urinalysis, Complete  Return in about 6 months (around 08/16/2019) for Renal cyst f/u with RUS prior.  Debroah Loop, PA-C  [facility] [address] [place], [place] [address] [phone]

## 2019-05-17 ENCOUNTER — Telehealth: Payer: Self-pay | Admitting: Physician Assistant

## 2019-05-17 NOTE — Telephone Encounter (Signed)
Pt is starting to have some slight pain again and wants to know if she needs to come in.  She has a hx of kidney stones and had surgery.  She is at work til 46, so when you call, please leave a message advising what she should do.

## 2019-05-17 NOTE — Telephone Encounter (Signed)
LMOM letting Ms Youn know that she should triage with her PCP first as all of her scans were negative the last time she was here in February. Advised her to call back if she has fever, chills, nausea, vomiting or increase in pain.

## 2019-05-18 NOTE — Telephone Encounter (Signed)
Patient called back stating she was having nausea, abdominal pain and dark urine. Advised patient to come in today for an appointment. Patient declined and stated she can not leave work and can not come in until Tuesday. Scheduled patient. Did advise patient if worsening symptoms and can not come into clinic to report to the emergency room

## 2019-05-19 DIAGNOSIS — F3181 Bipolar II disorder: Secondary | ICD-10-CM | POA: Diagnosis not present

## 2019-05-19 DIAGNOSIS — F4310 Post-traumatic stress disorder, unspecified: Secondary | ICD-10-CM | POA: Diagnosis not present

## 2019-05-19 DIAGNOSIS — F900 Attention-deficit hyperactivity disorder, predominantly inattentive type: Secondary | ICD-10-CM | POA: Diagnosis not present

## 2019-05-23 ENCOUNTER — Encounter: Payer: Self-pay | Admitting: Physician Assistant

## 2019-05-23 ENCOUNTER — Ambulatory Visit
Admission: RE | Admit: 2019-05-23 | Discharge: 2019-05-23 | Disposition: A | Discharge status: Home / Self Care | Payer: BC Managed Care – PPO | Source: Ambulatory Visit | Attending: Physician Assistant | Admitting: Physician Assistant

## 2019-05-23 ENCOUNTER — Ambulatory Visit
Admission: RE | Admit: 2019-05-23 | Discharge: 2019-05-23 | Disposition: A | Discharge status: Home / Self Care | Payer: BC Managed Care – PPO | Attending: Physician Assistant | Admitting: Physician Assistant

## 2019-05-23 ENCOUNTER — Other Ambulatory Visit: Payer: Self-pay

## 2019-05-23 ENCOUNTER — Ambulatory Visit (INDEPENDENT_AMBULATORY_CARE_PROVIDER_SITE_OTHER): Payer: BC Managed Care – PPO | Admitting: Physician Assistant

## 2019-05-23 ENCOUNTER — Telehealth: Payer: Self-pay

## 2019-05-23 ENCOUNTER — Other Ambulatory Visit: Payer: Self-pay | Admitting: Physician Assistant

## 2019-05-23 VITALS — BP 139/90 | HR 112 | Ht 64.0 in | Wt 179.0 lb

## 2019-05-23 DIAGNOSIS — R109 Unspecified abdominal pain: Secondary | ICD-10-CM | POA: Diagnosis not present

## 2019-05-23 DIAGNOSIS — Z87442 Personal history of urinary calculi: Secondary | ICD-10-CM | POA: Insufficient documentation

## 2019-05-23 DIAGNOSIS — N201 Calculus of ureter: Secondary | ICD-10-CM | POA: Diagnosis not present

## 2019-05-23 MED ORDER — ONDANSETRON HCL 4 MG PO TABS: 4.0000 mg | ORAL_TABLET | Freq: Three times a day (TID) | ORAL | 0 refills | Status: AC | PRN

## 2019-05-23 MED ORDER — HYDROCODONE-ACETAMINOPHEN 5-325 MG PO TABS: 1.0000 | ORAL_TABLET | Freq: Four times a day (QID) | ORAL | 0 refills | Status: DC | PRN

## 2019-05-23 MED ORDER — ALFUZOSIN HCL ER 10 MG PO TB24: 10.0000 mg | ORAL_TABLET | Freq: Every day | ORAL | 1 refills | Status: DC

## 2019-05-23 NOTE — Progress Notes (Signed)
05/23/2019 10:35 AM   Rose [name] 1975/12/21 [id number]  CC: Chief Complaint  Patient presents with  . Nephrolithiasis    HPI: Rose [name] is a 44 y.o. female with PMH diabetes and nephrolithiasis s/p cystoscopy and left ureteroscopy with laser lithotripsy and stent placement for management of a 4 x 6 mm proximal left ureteral stone on 01/17/2019 who presents today for evaluation of possible acute stone episode.  She was believed to have 2 residual nonobstructing renal stones following ureteroscopy: a 6 mm interpolar right renal stone and a 2 mm left lower pole stone.  Today, patient reports an approximate 1 week history of intermittent right flank and lower abdominal pain, nausea, chills, and gross hematuria.  She took an antiemetic, name unknown, prescribed by her psychiatrist for her management of nausea with adequate relief.  She has also been taking Aleve and ibuprofen for pain management without improvement in her discomfort, most recently yesterday.  She does report passage of a small stone fragment late last week, however she states her symptoms have persisted thereafter.  KUB today revealed and no longer visualized right-sided renal stone as well as multiple pelvic calcifications consistent with phleboliths and stable compared to prior. Reproductive status: IUD. I obtained a STAT CT stone study for further evaluation that shows a 6 mm proximal right UPJ stone with mild right hydronephrosis (patient signed waiver, no pregnancy testing required per imaging).  On comparison with KUB, it appears the stone is overlying a transverse spinous process and is barely visualizable.  Stone density measured at <1100 HU.  Skin to stone distance measured at <12cm.  In-office UA today positive for 2+ blood and trace leukocyte esterase; urine microscopy with 11-30 RBCs/HPF.  PMH: Past Medical History:  Diagnosis Date  . Anxiety   . Diabetes mellitus without complication ([place])   .  Family history of adverse reaction to anesthesia    SISTER-TOO MUCH ANESTHESIA FOR C-SECTION  . GERD (gastroesophageal reflux disease)   . History of kidney stones   . Neck pain   . Right leg pain     Surgical History: Past Surgical History:  Procedure Laterality Date  . CESAREAN SECTION    . CHOLECYSTECTOMY    . CYSTOSCOPY WITH RETROGRADE PYELOGRAM, URETEROSCOPY AND STENT PLACEMENT Left 01/17/2019   Procedure: CYSTOSCOPY WITH RETROGRADE PYELOGRAM, URETEROSCOPY AND STENT PLACEMENT;  Surgeon: Abbie Sons, MD;  Location: [facility];  Service: Urology;  Laterality: Left;  . LAPAROSCOPY N/A 08/24/2013   Procedure: LAPAROSCOPY OPERATIVE with lysis of adhesions ;  Surgeon: Marylynn Pearson, MD;  Location: [place] [facility];  Service: Gynecology;  Laterality: N/A;  . STONE EXTRACTION WITH BASKET Left 01/17/2019   Procedure: STONE EXTRACTION WITH BASKET;  Surgeon: Abbie Sons, MD;  Location: [facility];  Service: Urology;  Laterality: Left;  . wrist, cyst removal      Home Medications:  Allergies as of 05/23/2019      Reactions   Bactrim [sulfamethoxazole-trimethoprim] Swelling   Neurontin [gabapentin] Itching      Medication List       Accurate as of May 23, 2019 10:35 AM. If you have any questions, ask your nurse or doctor.        Adzenys XR-ODT 15.7 MG Tbed Generic drug: Amphetamine ER Take 15.7 mg by mouth daily.   BAYER BACK & BODY PO Take 1 tablet by mouth daily as needed (pain).   busPIRone 30 MG tablet Commonly known as: BUSPAR Take 30 mg by mouth 2 (two)  times daily.   cholecalciferol 25 MCG (1000 UNIT) tablet Commonly known as: VITAMIN D3 Take 1,000 Units by mouth daily.   diphenhydrAMINE 25 MG tablet Commonly known as: BENADRYL Take 50 mg by mouth daily as needed for allergies.   diphenhydramine-acetaminophen 25-500 MG Tabs tablet Commonly known as: TYLENOL PM Take 1 tablet by mouth at bedtime as needed (sleep).   glucose blood test strip Commonly known as: ONE  TOUCH ULTRA TEST 1 each by Other route daily. And lancets 1/day 250.00   Hair/Skin/Nails Caps Take 1 capsule by mouth daily.   HYDROcodone-acetaminophen 5-325 MG tablet Commonly known as: NORCO/VICODIN Take 1 tablet by mouth every 6 (six) hours as needed for moderate pain.   insulin detemir 100 UNIT/ML injection Commonly known as: LEVEMIR Inject 40 Units into the skin every morning.   IRON-C PO Take 1 tablet by mouth daily.   ketorolac 10 MG tablet Commonly known as: TORADOL Take 1 tablet (10 mg total) by mouth every 6 (six) hours as needed.   levonorgestrel 20 MCG/24HR IUD Commonly known as: MIRENA 1 each by Intrauterine route once.   LUBRICATING EYE DROPS OP Place 1 drop into both eyes daily as needed (dry eyes).   lurasidone 40 MG Tabs tablet Commonly known as: LATUDA Take 20 mg by mouth every evening.   metFORMIN 1000 MG tablet Commonly known as: GLUCOPHAGE Take 1,000 mg by mouth 2 (two) times daily.   naproxen sodium 220 MG tablet Commonly known as: ALEVE Take 220 mg by mouth daily as needed (pain).   omeprazole 20 MG capsule Commonly known as: PRILOSEC Take 20 mg by mouth as needed.   oxybutynin 5 MG tablet Commonly known as: DITROPAN 1 tab tid prn frequency,urgency, bladder spasm   pyridOXINE 100 MG tablet Commonly known as: VITAMIN B-6 Take 100 mg by mouth daily.   QUEtiapine 50 MG tablet Commonly known as: SEROQUEL Take 75 mg by mouth at bedtime.   simvastatin 20 MG tablet Commonly known as: ZOCOR Take 20 mg by mouth daily at 6 PM.       Allergies:  Allergies  Allergen Reactions  . Bactrim [Sulfamethoxazole-Trimethoprim] Swelling  . Neurontin [Gabapentin] Itching    Family History: Family History  Problem Relation Age of Onset  . Aneurysm Mother   . Blindness Father   . High blood pressure Father   . Diabetes Neg Hx     Social History:   reports that she has quit smoking. Her smoking use included cigarettes. She quit after 1.00  year of use. She has never used smokeless tobacco. She reports current alcohol use of about 1.0 standard drinks of alcohol per week. She reports current drug use. Drug: Marijuana.  Physical Exam: BP 139/90   Pulse (!) 112   Ht '5\' 4"'  (1.626 m)   Wt 179 lb (81.2 kg)   BMI 30.73 kg/m   Constitutional:  Alert and oriented, no acute distress, nontoxic appearing HEENT: [place], AT Cardiovascular: No clubbing, cyanosis, or edema Respiratory: Normal respiratory effort, no increased work of breathing Skin: No rashes, bruises or suspicious lesions Neurologic: Grossly intact, no focal deficits, moving all 4 extremities Psychiatric: Normal mood and affect  Laboratory Data: Results for orders placed or performed in visit on 05/23/19  Microscopic Examination   URINE  Result Value Ref Range   WBC, UA 0-5 0 - 5 /hpf   RBC 11-30 (A) 0 - 2 /hpf   Epithelial Cells (non renal) 0-10 0 - 10 /hpf   Bacteria, UA  None seen None seen/Few  Urinalysis, Complete  Result Value Ref Range   Specific Gravity, UA 1.015 1.005 - 1.030   pH, UA 5.5 5.0 - 7.5   Color, UA Yellow Yellow   Appearance Ur Cloudy (A) Clear   Leukocytes,UA Trace (A) Negative   Protein,UA Negative Negative/Trace   Glucose, UA Negative Negative   Ketones, UA Negative Negative   RBC, UA 2+ (A) Negative   Bilirubin, UA Negative Negative   Urobilinogen, Ur 0.2 0.2 - 1.0 mg/dL   Nitrite, UA Negative Negative   Microscopic Examination See below:    Pertinent Imaging: KUB, 05/23/2019: CLINICAL DATA:  Flank pain, possible recently passed stone  EXAM: ABDOMEN - 1 VIEW  COMPARISON:  01/10/2019  FINDINGS: Scattered large and small bowel gas is noted. Changes of prior cholecystectomy are seen. The known UPJ stone is faintly visualized overlying the L3 transverse process on the right. This has migrated from the kidney on the prior exam. Multiple phleboliths are noted within the pelvis. No bony abnormality is seen.  IMPRESSION: Right  ureteral calculus.   Electronically Signed   By: Inez Catalina M.D.   On: 05/23/2019 12:23  CT stone study, 05/23/2019: CLINICAL DATA:  Acute right flank pain.  EXAM: CT ABDOMEN AND PELVIS WITHOUT CONTRAST  TECHNIQUE: Multidetector CT imaging of the abdomen and pelvis was performed following the standard protocol without IV contrast.  COMPARISON:  December 29, 2018.  FINDINGS: Lower chest: No acute abnormality.  Hepatobiliary: No focal liver abnormality is seen. Status post cholecystectomy. No biliary dilatation.  Pancreas: Unremarkable. No pancreatic ductal dilatation or surrounding inflammatory changes.  Spleen: Normal in size without focal abnormality.  Adrenals/Urinary Tract: Adrenal glands appear normal. Mild right hydronephrosis is noted secondary to 6 mm calculus at the right ureteropelvic junction. Stable probable left renal cyst is noted. Urinary bladder is unremarkable.  Stomach/Bowel: Stomach is within normal limits. Appendix appears normal. No evidence of bowel wall thickening, distention, or inflammatory changes.  Vascular/Lymphatic: No significant vascular findings are present. No enlarged abdominal or pelvic lymph nodes.  Reproductive: Intrauterine device is noted in good position. No adnexal abnormality is noted.  Other: No abdominal wall hernia or abnormality. No abdominopelvic ascites.  Musculoskeletal: No acute or significant osseous findings.  IMPRESSION: Mild right hydronephrosis is noted secondary to 6 mm calculus at the right ureteropelvic junction.   Electronically Signed   By: Marijo Conception M.D.   On: 05/23/2019 11:59  I personally reviewed the images referenced above and agree with the radiographic findings of a 6 mm proximal right ureteral stone, barely visible on KUB, with mild upstream hydronephrosis.  Assessment & Plan:   1. Flank pain with history of urolithiasis 44 year old female with a history of  nephrolithiasis s/p URS/LL for management of a left ureteral stone 4 months ago presents with a 1 week history of intermittent right flank pain, gross hematuria, and nausea.  UA notable for microscopic hematuria today but reassuring for infection, will send for culture to confirm.  Imaging confirms the previously known 6 mm nonobstructing right interpolar renal stone has migrated into the proximal right ureter.  We discussed various treatment options for her stone including trial of passage vs. ESWL vs. ureteroscopy with laser lithotripsy and stent. We discussed the risks and benefits of each including bleeding, infection, damage to surrounding structures, efficacy with need for possible further intervention, and need for temporary ureteral stent.  Patient does not wish to undergo repeat URS at this time. She is interested  in ESWL, however she will not be able to arrange a driver for this week. Will plan for ESWL next week. Counseled patient to stop NSAIDs 48 hours in advance of procedure. Booking sheet completed today.  Prescribing Norco, alfuzosin, and Zofran for pain control, MET, and nausea control in the interim. Counseled patient to contact our office immediately or proceed to the ED if she develops fever, chills, nausea, vomiting, or uncontrollable flank pain. She expressed understanding. - Urinalysis, Complete - CT RENAL STONE STUDY - CULTURE, URINE COMPREHENSIVE - ondansetron (ZOFRAN) 4 MG tablet; Take 1 tablet (4 mg total) by mouth every 8 (eight) hours as needed for up to 14 days.  Dispense: 20 tablet; Refill: 0 - alfuzosin (UROXATRAL) 10 MG 24 hr tablet; Take 1 tablet (10 mg total) by mouth daily with breakfast.  Dispense: 30 tablet; Refill: 1 - HYDROcodone-acetaminophen (NORCO/VICODIN) 5-325 MG tablet; Take 1 tablet by mouth every 6 (six) hours as needed for moderate pain.  Dispense: 10 tablet; Refill: 0   Return in about 9 days (around 06/01/2019) for ESWL.  Debroah Loop,  PA-C  [facility] [address], [place], [place] [address] [phone]

## 2019-05-23 NOTE — Telephone Encounter (Signed)
Attempted to reach patient to inform her to go to the radiology desk before her appt today 05/23/19 to obtain an xray.

## 2019-05-24 ENCOUNTER — Encounter: Payer: Self-pay | Admitting: Urology

## 2019-05-24 DIAGNOSIS — G4700 Insomnia, unspecified: Secondary | ICD-10-CM | POA: Diagnosis not present

## 2019-05-24 DIAGNOSIS — F4312 Post-traumatic stress disorder, chronic: Secondary | ICD-10-CM | POA: Diagnosis not present

## 2019-05-24 DIAGNOSIS — F3181 Bipolar II disorder: Secondary | ICD-10-CM | POA: Diagnosis not present

## 2019-05-24 DIAGNOSIS — F411 Generalized anxiety disorder: Secondary | ICD-10-CM | POA: Diagnosis not present

## 2019-05-24 LAB — URINALYSIS, COMPLETE
Bilirubin, UA: NEGATIVE
Glucose, UA: NEGATIVE
Ketones, UA: NEGATIVE
Nitrite, UA: NEGATIVE
Protein,UA: NEGATIVE
Specific Gravity, UA: 1.015 (ref 1.005–1.030)
Urobilinogen, Ur: 0.2 mg/dL (ref 0.2–1.0)
pH, UA: 5.5 (ref 5.0–7.5)

## 2019-05-24 LAB — MICROSCOPIC EXAMINATION: Bacteria, UA: NONE SEEN

## 2019-05-25 ENCOUNTER — Other Ambulatory Visit: Payer: Self-pay | Admitting: Urology

## 2019-05-25 DIAGNOSIS — N135 Crossing vessel and stricture of ureter without hydronephrosis: Secondary | ICD-10-CM

## 2019-05-26 LAB — CULTURE, URINE COMPREHENSIVE

## 2019-05-30 ENCOUNTER — Other Ambulatory Visit
Admission: RE | Admit: 2019-05-30 | Discharge: 2019-05-30 | Disposition: A | Discharge status: Home / Self Care | Payer: BC Managed Care – PPO | Source: Ambulatory Visit | Attending: Urology | Admitting: Urology

## 2019-05-30 ENCOUNTER — Other Ambulatory Visit: Payer: Self-pay

## 2019-05-30 DIAGNOSIS — Z01812 Encounter for preprocedural laboratory examination: Secondary | ICD-10-CM | POA: Diagnosis not present

## 2019-05-30 DIAGNOSIS — Z20822 Contact with and (suspected) exposure to covid-19: Secondary | ICD-10-CM | POA: Insufficient documentation

## 2019-05-30 NOTE — Progress Notes (Signed)
PT called to ask about meds. PT told to hold metformin, Toradol, bayer, and any otc NSAIDS 72 hours prior to procedure. PT also informed to only take half of insulin dose day prior of surgery. Pt informed not to take insulin morning of surgery. PT encouraged to continue to check glucose at home and call surgeon office if glucose is too high without meds.  Pt verbalizes understanding and denies any further questions.

## 2019-05-31 LAB — SARS CORONAVIRUS 2 (TAT 6-24 HRS): SARS Coronavirus 2: NEGATIVE

## 2019-06-01 ENCOUNTER — Encounter
Admission: RE | Disposition: A | Discharge status: Home / Self Care | Payer: Self-pay | Source: Home / Self Care | Attending: Urology

## 2019-06-01 ENCOUNTER — Other Ambulatory Visit: Payer: Self-pay

## 2019-06-01 ENCOUNTER — Ambulatory Visit: Payer: BC Managed Care – PPO

## 2019-06-01 ENCOUNTER — Ambulatory Visit
Admission: RE | Admit: 2019-06-01 | Discharge: 2019-06-01 | Disposition: A | Discharge status: Home / Self Care | Payer: BC Managed Care – PPO | Attending: Urology | Admitting: Urology

## 2019-06-01 ENCOUNTER — Encounter: Payer: Self-pay | Admitting: Urology

## 2019-06-01 DIAGNOSIS — N201 Calculus of ureter: Secondary | ICD-10-CM | POA: Insufficient documentation

## 2019-06-01 DIAGNOSIS — N135 Crossing vessel and stricture of ureter without hydronephrosis: Secondary | ICD-10-CM

## 2019-06-01 HISTORY — PX: EXTRACORPOREAL SHOCK WAVE LITHOTRIPSY: SHX1557

## 2019-06-01 LAB — URINE DRUG SCREEN, QUALITATIVE (ARMC ONLY)
Amphetamines, Ur Screen: POSITIVE — AB
Barbiturates, Ur Screen: NOT DETECTED
Benzodiazepine, Ur Scrn: NOT DETECTED
Cannabinoid 50 Ng, Ur ~~LOC~~: NOT DETECTED
Cocaine Metabolite,Ur ~~LOC~~: NOT DETECTED
MDMA (Ecstasy)Ur Screen: NOT DETECTED
Methadone Scn, Ur: NOT DETECTED
Opiate, Ur Screen: POSITIVE — AB
Phencyclidine (PCP) Ur S: NOT DETECTED
Tricyclic, Ur Screen: NOT DETECTED

## 2019-06-01 LAB — GLUCOSE, CAPILLARY
Glucose-Capillary: 196 mg/dL — ABNORMAL HIGH (ref 70–99)
Glucose-Capillary: 144 mg/dL — ABNORMAL HIGH (ref 70–99)

## 2019-06-01 LAB — POCT PREGNANCY, URINE: Preg Test, Ur: NEGATIVE

## 2019-06-01 SURGERY — LITHOTRIPSY, ESWL
Anesthesia: Moderate Sedation | Laterality: Right

## 2019-06-01 MED ORDER — SODIUM CHLORIDE 0.9 % IV SOLN: INTRAVENOUS | Status: DC

## 2019-06-01 MED ORDER — DIAZEPAM 5 MG PO TABS
10.0000 mg | ORAL_TABLET | ORAL | Status: AC
  Administered 2019-06-01: 10 mg via ORAL

## 2019-06-01 MED ORDER — HYDROCODONE-ACETAMINOPHEN 5-325 MG PO TABS: 1.0000 | ORAL_TABLET | ORAL | 0 refills | Status: AC | PRN

## 2019-06-01 MED ORDER — DIPHENHYDRAMINE HCL 25 MG PO CAPS
25.0000 mg | ORAL_CAPSULE | ORAL | Status: AC
  Administered 2019-06-01: 25 mg via ORAL

## 2019-06-01 MED ORDER — DIPHENHYDRAMINE HCL 25 MG PO CAPS
ORAL_CAPSULE | ORAL | Status: AC
  Filled 2019-06-01: qty 1

## 2019-06-01 MED ORDER — DIAZEPAM 5 MG PO TABS
ORAL_TABLET | ORAL | Status: AC
  Filled 2019-06-01: qty 2

## 2019-06-01 NOTE — Brief Op Note (Signed)
06/01/2019  9:50 AM  PATIENT:  Rose [name]  44 y.o. female  PRE-OPERATIVE DIAGNOSIS:  60mm right proximal ureteral stone  POST-OPERATIVE DIAGNOSIS:  Same  PROCEDURE:  Procedure(s): EXTRACORPOREAL SHOCK WAVE LITHOTRIPSY (ESWL) (Right)  SURGEON:  Surgeon(s) and Role:    * Billey Co, MD - Primary  ANESTHESIA: Conscious Sedation  EBL:  None  Drains: None  Specimen: None  Findings:  1.Stone smudged on flouroscopy, tolerated SWL well  DISPO: Flomax, pain meds PRN, RTC 2 weeks KUB  Nickolas Madrid, MD 06/01/2019

## 2019-06-01 NOTE — Discharge Instructions (Signed)

## 2019-06-14 ENCOUNTER — Other Ambulatory Visit: Payer: Self-pay

## 2019-06-14 DIAGNOSIS — R109 Unspecified abdominal pain: Secondary | ICD-10-CM

## 2019-06-14 DIAGNOSIS — F4312 Post-traumatic stress disorder, chronic: Secondary | ICD-10-CM | POA: Diagnosis not present

## 2019-06-14 DIAGNOSIS — F3181 Bipolar II disorder: Secondary | ICD-10-CM | POA: Diagnosis not present

## 2019-06-14 DIAGNOSIS — G4700 Insomnia, unspecified: Secondary | ICD-10-CM | POA: Diagnosis not present

## 2019-06-14 DIAGNOSIS — F411 Generalized anxiety disorder: Secondary | ICD-10-CM | POA: Diagnosis not present

## 2019-06-15 ENCOUNTER — Other Ambulatory Visit: Payer: Self-pay

## 2019-06-15 ENCOUNTER — Ambulatory Visit (INDEPENDENT_AMBULATORY_CARE_PROVIDER_SITE_OTHER): Payer: BC Managed Care – PPO | Admitting: Physician Assistant

## 2019-06-15 ENCOUNTER — Ambulatory Visit
Admission: RE | Admit: 2019-06-15 | Discharge: 2019-06-15 | Disposition: A | Discharge status: Home / Self Care | Payer: BC Managed Care – PPO | Source: Ambulatory Visit | Attending: Physician Assistant | Admitting: Physician Assistant

## 2019-06-15 VITALS — BP 111/79 | HR 106 | Ht 64.0 in | Wt 180.0 lb

## 2019-06-15 DIAGNOSIS — N201 Calculus of ureter: Secondary | ICD-10-CM | POA: Diagnosis not present

## 2019-06-15 DIAGNOSIS — Z87442 Personal history of urinary calculi: Secondary | ICD-10-CM | POA: Diagnosis not present

## 2019-06-15 DIAGNOSIS — R109 Unspecified abdominal pain: Secondary | ICD-10-CM | POA: Insufficient documentation

## 2019-06-15 MED ORDER — ALFUZOSIN HCL ER 10 MG PO TB24: 10.0000 mg | ORAL_TABLET | Freq: Every day | ORAL | 1 refills | Status: DC

## 2019-06-15 NOTE — Progress Notes (Signed)
06/15/2019 3:43 PM   Rose Dean Soohoo 01-17-1975 [id number]  CC: Postop ESWL  HPI: Rose [name] is a 44 y.o. female with PMH diabetes and nephrolithiasis s/p ESWL with Dr. Diamantina Providence on 06/01/2019 for management of a 6 mm right proximal ureteral stone.  Intraoperative findings notable for smudging of the stone.  In the interim, she reports having passed multiple stone fragments, which she brings with her today.  She has had some nausea and persistent bilateral low back pain since ESWL.  She has taken Advil for this, which she does not believe is helping.  She states she stopped alfuzosin in advance of ESWL.  She has also had some occasional nausea and has taken Zofran for this.. She denies fever, chills, and vomiting.  Follow-up KUB today revealed resolution of the right ureteral stone. In-office UA today positive for 3+ blood and trace protein; urine microscopy with >30 RBCs/HPF.   PMH: Past Medical History:  Diagnosis Date  . Anxiety   . Diabetes mellitus without complication ([place])   . Family history of adverse reaction to anesthesia    SISTER-TOO MUCH ANESTHESIA FOR C-SECTION  . GERD (gastroesophageal reflux disease)   . History of kidney stones   . Neck pain   . Right leg pain     Surgical History: Past Surgical History:  Procedure Laterality Date  . CESAREAN SECTION    . CHOLECYSTECTOMY    . CYSTOSCOPY WITH RETROGRADE PYELOGRAM, URETEROSCOPY AND STENT PLACEMENT Left 01/17/2019   Procedure: CYSTOSCOPY WITH RETROGRADE PYELOGRAM, URETEROSCOPY AND STENT PLACEMENT;  Surgeon: Abbie Sons, MD;  Location: [facility];  Service: Urology;  Laterality: Left;  . EXTRACORPOREAL SHOCK WAVE LITHOTRIPSY Right 06/01/2019   Procedure: EXTRACORPOREAL SHOCK WAVE LITHOTRIPSY (ESWL);  Surgeon: Billey Co, MD;  Location: [facility];  Service: Urology;  Laterality: Right;  . LAPAROSCOPY N/A 08/24/2013   Procedure: LAPAROSCOPY OPERATIVE with lysis of adhesions ;  Surgeon: Marylynn Pearson, MD;  Location: [facility];  Service: Gynecology;  Laterality: N/A;  . STONE EXTRACTION WITH BASKET Left 01/17/2019   Procedure: STONE EXTRACTION WITH BASKET;  Surgeon: Abbie Sons, MD;  Location: [facility];  Service: Urology;  Laterality: Left;  . wrist, cyst removal      Home Medications:  Allergies as of 06/15/2019      Reactions   Bactrim [sulfamethoxazole-trimethoprim] Swelling   Neurontin [gabapentin] Itching      Medication List       Accurate as of June 15, 2019  3:43 PM. If you have any questions, ask your nurse or doctor.        STOP taking these medications   HYDROcodone-acetaminophen 5-325 MG tablet Commonly known as: NORCO/VICODIN Stopped by: Debroah Loop, PA-C   oxybutynin 5 MG tablet Commonly known as: DITROPAN Stopped by: Debroah Loop, PA-C     TAKE these medications   Adzenys XR-ODT 15.7 MG Tbed Generic drug: Amphetamine ER Take 15.7 mg by mouth daily.   alfuzosin 10 MG 24 hr tablet Commonly known as: UROXATRAL Take 1 tablet (10 mg total) by mouth daily with breakfast.   BAYER BACK & BODY PO Take 1 tablet by mouth daily as needed (pain).   busPIRone 30 MG tablet Commonly known as: BUSPAR Take 30 mg by mouth 2 (two) times daily.   cholecalciferol 25 MCG (1000 UNIT) tablet Commonly known as: VITAMIN D3 Take 1,000 Units by mouth daily.   citalopram 20 MG tablet Commonly known as: CELEXA Take by mouth.   clonazePAM 1  MG tablet Commonly known as: KLONOPIN Take 1 mg by mouth at bedtime as needed.   diphenhydrAMINE 25 MG tablet Commonly known as: BENADRYL Take 50 mg by mouth daily as needed for allergies.   diphenhydramine-acetaminophen 25-500 MG Tabs tablet Commonly known as: TYLENOL PM Take 1 tablet by mouth at bedtime as needed (sleep).   glucose blood test strip Commonly known as: ONE TOUCH ULTRA TEST 1 each by Other route daily. And lancets 1/day 250.00   Hair/Skin/Nails Caps Take 1 capsule by mouth daily.     insulin detemir 100 UNIT/ML injection Commonly known as: LEVEMIR Inject 40 Units into the skin every morning.   IRON-C PO Take 1 tablet by mouth daily.   ketorolac 10 MG tablet Commonly known as: TORADOL Take 1 tablet (10 mg total) by mouth every 6 (six) hours as needed.   levonorgestrel 20 MCG/24HR IUD Commonly known as: MIRENA 1 each by Intrauterine route once.   LUBRICATING EYE DROPS OP Place 1 drop into both eyes daily as needed (dry eyes).   lurasidone 40 MG Tabs tablet Commonly known as: LATUDA Take 20 mg by mouth every evening.   metFORMIN 1000 MG tablet Commonly known as: GLUCOPHAGE Take 1,000 mg by mouth 2 (two) times daily.   naproxen sodium 220 MG tablet Commonly known as: ALEVE Take 220 mg by mouth daily as needed (pain).   omeprazole 20 MG capsule Commonly known as: PRILOSEC Take 20 mg by mouth as needed.   pyridOXINE 100 MG tablet Commonly known as: VITAMIN B-6 Take 100 mg by mouth daily.   QUEtiapine 50 MG tablet Commonly known as: SEROQUEL Take 75 mg by mouth at bedtime.   simvastatin 20 MG tablet Commonly known as: ZOCOR Take 20 mg by mouth daily at 6 PM.       Allergies:  Allergies  Allergen Reactions  . Bactrim [Sulfamethoxazole-Trimethoprim] Swelling  . Neurontin [Gabapentin] Itching    Family History: Family History  Problem Relation Age of Onset  . Aneurysm Mother   . Blindness Father   . High blood pressure Father   . Diabetes Neg Hx     Social History:  reports that she has quit smoking. Her smoking use included cigarettes. She quit after 1.00 year of use. She has never used smokeless tobacco. She reports current alcohol use of about 1.0 standard drink of alcohol per week. She reports current drug use. Drug: Marijuana.  Physical Exam: BP 111/79   Pulse (!) 106   Ht 5\' 4"  (1.626 m)   Wt 180 lb (81.6 kg)   BMI 30.90 kg/m   Constitutional:  Alert and oriented, No acute distress. HEENT: [place] AT, moist mucus membranes.   Trachea midline, no masses. Cardiovascular: No clubbing, cyanosis, or edema. Respiratory: Normal respiratory effort, no increased work of breathing. Skin: No rashes, bruises or suspicious lesions. Neurologic: Grossly intact, no focal deficits, moving all 4 extremities. Psychiatric: Normal mood and affect.  Laboratory Data: Results for orders placed or performed in visit on 06/15/19  Microscopic Examination   Urine  Result Value Ref Range   WBC, UA 0-5 0 - 5 /hpf   RBC >30 (A) 0 - 2 /hpf   Epithelial Cells (non renal) 0-10 0 - 10 /hpf   Bacteria, UA None seen None seen/Few  Urinalysis, Complete  Result Value Ref Range   Specific Gravity, UA 1.025 1.005 - 1.030   pH, UA 7.0 5.0 - 7.5   Color, UA Yellow Yellow   Appearance Ur Cloudy (A)  Clear   Leukocytes,UA Negative Negative   Protein,UA Trace (A) Negative/Trace   Glucose, UA Negative Negative   Ketones, UA Negative Negative   RBC, UA 3+ (A) Negative   Bilirubin, UA Negative Negative   Urobilinogen, Ur 1.0 0.2 - 1.0 mg/dL   Nitrite, UA Negative Negative   Microscopic Examination See below:    Pertinent Imaging: KUB, 06/15/2019: CLINICAL DATA:  History of kidney stones.  Previous lithotripsy.  EXAM: ABDOMEN - 1 VIEW  COMPARISON:  06/01/2019 and CT 05/23/2019  FINDINGS: Bowel gas pattern is nonobstructive. Previous cholecystectomy. Previously seen stone in the region of the proximal right ureter is no longer visualized. No calcifications project over the kidneys. Numerous pelvic calcifications unchanged compatible with phleboliths. IUD over the midline pelvis unchanged. Remainder of the exam is unchanged.  IMPRESSION: 1. Previously seen proximal right ureteral stone no longer visualized.  2.  Nonobstructive bowel gas pattern.   Electronically Signed   By: Marin Olp M.D.   On: 06/16/2019 13:27  I personally reviewed the images referenced above and note resolution of the proximal right ureteral stone  and stable numerous pelvic calcifications consistent with phleboliths.  Assessment & Plan:   1. Right ureteral stone S/p ESWL.  KUB reveals resolution of stone, however she is having persistent symptoms and UA is notable for microscopic hematuria.  I believe she is passing some residual fragments at this time.  Counseled her to restart alfuzosin, prescription provided, stay well-hydrated, continue Zofran and OTC pain meds as needed.  We will plan for recheck in 4 weeks with renal ultrasound prior.  Counseled her to call us immediately or proceed to the ED if she develops uncontrollable flank pain, uncontrollable nausea, vomiting, fever, or chills in the interim.  She expressed understanding.  Will send stone fragments for analysis today. - Urinalysis, Complete - Urinalysis, Complete - Calculi, with Photograph - US RENAL; Future - alfuzosin (UROXATRAL) 10 MG 24 hr tablet; Take 1 tablet (10 mg total) by mouth daily with breakfast.  Dispense: 30 tablet; Refill: 1   Return in 4 weeks (on 07/13/2019) for Stone f/u with RUS prior.  Debroah Loop, PA-C  [facility] [address], [place],  [address] [phone]

## 2019-06-15 NOTE — Patient Instructions (Addendum)
1. Restart Alfuzosin (Uroxatrol) or Flomax daily until I see you again in one month. 2. Take ondansetron (Zofran) as needed for nausea. 3. Continue Advil and Tylenol for pain control. 4. I will see you back in clinic in one month with a kidney ultrasound prior to make sure there isn't any more swelling in your right kidney from the stone. 5. Call our office immediately or go to the Emergency Room if you develop severe flank pain, uncontrolled nausea, vomiting, fever, or chills in the meantime.

## 2019-06-16 LAB — URINALYSIS, COMPLETE
Bilirubin, UA: NEGATIVE
Glucose, UA: NEGATIVE
Ketones, UA: NEGATIVE
Leukocytes,UA: NEGATIVE
Nitrite, UA: NEGATIVE
Specific Gravity, UA: 1.025 (ref 1.005–1.030)
Urobilinogen, Ur: 1 mg/dL (ref 0.2–1.0)
pH, UA: 7 (ref 5.0–7.5)

## 2019-06-16 LAB — MICROSCOPIC EXAMINATION
Bacteria, UA: NONE SEEN
RBC, Urine: >30 /hpf — AB (ref 0–2)

## 2019-06-22 ENCOUNTER — Ambulatory Visit
Admission: RE | Admit: 2019-06-22 | Discharge: 2019-06-22 | Disposition: A | Discharge status: Home / Self Care | Payer: BC Managed Care – PPO | Source: Ambulatory Visit | Attending: Physician Assistant | Admitting: Physician Assistant

## 2019-06-22 ENCOUNTER — Encounter: Payer: Self-pay | Admitting: Physician Assistant

## 2019-06-22 ENCOUNTER — Ambulatory Visit (INDEPENDENT_AMBULATORY_CARE_PROVIDER_SITE_OTHER): Payer: BC Managed Care – PPO | Admitting: Physician Assistant

## 2019-06-22 ENCOUNTER — Other Ambulatory Visit: Payer: Self-pay

## 2019-06-22 ENCOUNTER — Telehealth: Payer: Self-pay | Admitting: *Deleted

## 2019-06-22 VITALS — BP 127/86 | HR 98 | Temp 98.2°F | Ht 64.0 in | Wt 171.0 lb

## 2019-06-22 DIAGNOSIS — N201 Calculus of ureter: Secondary | ICD-10-CM

## 2019-06-22 MED ORDER — KETOROLAC TROMETHAMINE 30 MG/ML IJ SOLN: 30.0000 mg | Freq: Once | INTRAMUSCULAR | Status: DC

## 2019-06-22 MED ORDER — TAMSULOSIN HCL 0.4 MG PO CAPS: 0.4000 mg | ORAL_CAPSULE | Freq: Every day | ORAL | 0 refills | Status: DC

## 2019-06-22 MED ORDER — KETOROLAC TROMETHAMINE 60 MG/2ML IM SOLN
30.0000 mg | Freq: Once | INTRAMUSCULAR | Status: AC
  Administered 2019-06-22: 30 mg via INTRAMUSCULAR

## 2019-06-22 MED ORDER — ALFUZOSIN HCL ER 10 MG PO TB24: 10.0000 mg | ORAL_TABLET | Freq: Every day | ORAL | 1 refills | Status: DC

## 2019-06-22 MED ORDER — OXYCODONE-ACETAMINOPHEN 7.5-325 MG PO TABS: 1.0000 | ORAL_TABLET | Freq: Four times a day (QID) | ORAL | 0 refills | Status: DC | PRN

## 2019-06-22 MED ORDER — OXYCODONE-ACETAMINOPHEN 5-325 MG PO TABS: 1.0000 | ORAL_TABLET | ORAL | 0 refills | Status: DC | PRN

## 2019-06-22 NOTE — Patient Instructions (Addendum)
You have a kidney stone in your mid-right ureter. You have decided to pursue shock wave lithotripsy to treat this stone, possibly next Thursday.  You will see Dr. Diamantina Providence at our [facility] office on Tuesday, 6/22. You will need to get an abdominal X-ray that morning before your appointment. At that time, you will discuss possible shock wave lithotripsy next Thursday.  Keep taking Uroxatral (alfuzosin) every day and stay well hydrated. I have sent refills of this medication to the [facility] in [place]. I have also sent pain medication to this pharmacy which you may pick up today. DO NOT combine Tylenol (acetaminophen) with this pain medication. You may also continue to take Zofran for nausea.  If you develop uncontrollable pain, nausea, vomiting, fever, or chills, go to the Emergency Department.

## 2019-06-22 NOTE — Progress Notes (Signed)
06/22/2019 4:45 PM   Rose Dean Bendall 1975/04/29 [id number]  CC: Chief Complaint  Patient presents with  . Flank Pain    right flank pain, nausea   HPI: Rose [name] is a 44 y.o. female with PMH diabetes and nephrolithiasis s/p ESWL with Dr. Diamantina Providence on 06/01/2019 for management of a 6 mm proximal right ureteral stone who presents today for evaluation of acute worsening right flank pain and nausea.  She followed up with me in clinic 06/15/2019 for recheck following ESWL. At that time, she reported occasional nausea and persistent bilateral low back pain. KUB revealed clearance of the proximal right ureteral stone, however she had microscopic hematuria on UA. I counseled her to continue MET with alfuzosin and to push fluids as I suspected she was passing residual fragments at that time.   She returns to clinic today with reports of acute worsening in her right flank pain and nausea since 3 AM today. She has taken 800 mg of ibuprofen and 1950 mg of acetaminophen for treatment of her pain with no symptomatic improvement. Additionally, she has taken Zofran with minimal improvement.   I obtained a stat CT stone study today for further evaluation given her recent negative KUB. CT revealed right hydronephrosis with mild perinephric stranding in the setting of a 6 to 7 mm mid right ureteral calculus.  In-office UA today positive for 3+ blood and 2+ protein; urine microscopy with 6-10 WBCs/HPF and>30 RBCs/HPF.  PMH: Past Medical History:  Diagnosis Date  . Anxiety   . Diabetes mellitus without complication ([place])   . Family history of adverse reaction to anesthesia    SISTER-TOO MUCH ANESTHESIA FOR C-SECTION  . GERD (gastroesophageal reflux disease)   . History of kidney stones   . Neck pain   . Right leg pain     Surgical History: Past Surgical History:  Procedure Laterality Date  . CESAREAN SECTION    . CHOLECYSTECTOMY    . CYSTOSCOPY WITH RETROGRADE PYELOGRAM, URETEROSCOPY  AND STENT PLACEMENT Left 01/17/2019   Procedure: CYSTOSCOPY WITH RETROGRADE PYELOGRAM, URETEROSCOPY AND STENT PLACEMENT;  Surgeon: Abbie Sons, MD;  Location: [facility];  Service: Urology;  Laterality: Left;  . EXTRACORPOREAL SHOCK WAVE LITHOTRIPSY Right 06/01/2019   Procedure: EXTRACORPOREAL SHOCK WAVE LITHOTRIPSY (ESWL);  Surgeon: Billey Co, MD;  Location: [facility];  Service: Urology;  Laterality: Right;  . LAPAROSCOPY N/A 08/24/2013   Procedure: LAPAROSCOPY OPERATIVE with lysis of adhesions ;  Surgeon: Marylynn Pearson, MD;  Location: [facility];  Service: Gynecology;  Laterality: N/A;  . STONE EXTRACTION WITH BASKET Left 01/17/2019   Procedure: STONE EXTRACTION WITH BASKET;  Surgeon: Abbie Sons, MD;  Location: [facility];  Service: Urology;  Laterality: Left;  . wrist, cyst removal      Home Medications:  Allergies as of 06/22/2019      Reactions   Bactrim [sulfamethoxazole-trimethoprim] Swelling   Neurontin [gabapentin] Itching      Medication List       Accurate as of June 22, 2019  4:45 PM. If you have any questions, ask your nurse or doctor.        STOP taking these medications   ketorolac 10 MG tablet Commonly known as: TORADOL Stopped by: Debroah Loop, PA-C     TAKE these medications   Adzenys XR-ODT 15.7 MG Tbed Generic drug: Amphetamine ER Take 15.7 mg by mouth daily.   alfuzosin 10 MG 24 hr tablet Commonly known as: UROXATRAL Take 1 tablet (10 mg  total) by mouth daily with breakfast.   BAYER BACK & BODY PO Take 1 tablet by mouth daily as needed (pain).   busPIRone 30 MG tablet Commonly known as: BUSPAR Take 30 mg by mouth 2 (two) times daily.   cholecalciferol 25 MCG (1000 UNIT) tablet Commonly known as: VITAMIN D3 Take 1,000 Units by mouth daily.   citalopram 20 MG tablet Commonly known as: CELEXA Take by mouth.   clonazePAM 1 MG tablet Commonly known as: KLONOPIN Take 1 mg by mouth at bedtime as needed.   diphenhydrAMINE 25 MG  tablet Commonly known as: BENADRYL Take 50 mg by mouth daily as needed for allergies.   diphenhydramine-acetaminophen 25-500 MG Tabs tablet Commonly known as: TYLENOL PM Take 1 tablet by mouth at bedtime as needed (sleep).   glucose blood test strip Commonly known as: ONE TOUCH ULTRA TEST 1 each by Other route daily. And lancets 1/day 250.00   Hair/Skin/Nails Caps Take 1 capsule by mouth daily.   insulin detemir 100 UNIT/ML injection Commonly known as: LEVEMIR Inject 40 Units into the skin every morning.   IRON-C PO Take 1 tablet by mouth daily.   levonorgestrel 20 MCG/24HR IUD Commonly known as: MIRENA 1 each by Intrauterine route once.   LUBRICATING EYE DROPS OP Place 1 drop into both eyes daily as needed (dry eyes).   lurasidone 40 MG Tabs tablet Commonly known as: LATUDA Take 20 mg by mouth every evening.   metFORMIN 1000 MG tablet Commonly known as: GLUCOPHAGE Take 1,000 mg by mouth 2 (two) times daily.   naproxen sodium 220 MG tablet Commonly known as: ALEVE Take 220 mg by mouth daily as needed (pain).   omeprazole 20 MG capsule Commonly known as: PRILOSEC Take 20 mg by mouth as needed.   oxyCODONE-acetaminophen 7.5-325 MG tablet Commonly known as: PERCOCET Take 1 tablet by mouth every 6 (six) hours as needed for severe pain. Started by: Debroah Loop, PA-C   pyridOXINE 100 MG tablet Commonly known as: VITAMIN B-6 Take 100 mg by mouth daily.   QUEtiapine 50 MG tablet Commonly known as: SEROQUEL Take 75 mg by mouth at bedtime.   simvastatin 20 MG tablet Commonly known as: ZOCOR Take 20 mg by mouth daily at 6 PM.       Allergies:  Allergies  Allergen Reactions  . Bactrim [Sulfamethoxazole-Trimethoprim] Swelling  . Neurontin [Gabapentin] Itching    Family History: Family History  Problem Relation Age of Onset  . Aneurysm Mother   . Blindness Father   . High blood pressure Father   . Diabetes Neg Hx     Social History:    reports that she has quit smoking. Her smoking use included cigarettes. She quit after 1.00 year of use. She has never used smokeless tobacco. She reports current alcohol use of about 1.0 standard drink of alcohol per week. She reports current drug use. Drug: Marijuana.  Physical Exam: BP 127/86   Pulse 98   Temp 98.2 F (36.8 C) (Oral)   Ht '5\' 4"'  (1.626 m)   Wt 171 lb (77.6 kg)   BMI 29.35 kg/m   Constitutional:  Alert and oriented, no acute distress, nontoxic appearing HEENT: [place], AT Cardiovascular: No clubbing, cyanosis, or edema Respiratory: Normal respiratory effort, no increased work of breathing Skin: No rashes, bruises or suspicious lesions Neurologic: Grossly intact, no focal deficits, moving all 4 extremities Psychiatric: Normal mood and affect  Laboratory Data: Results for orders placed or performed in visit on 06/22/19  CULTURE,  URINE COMPREHENSIVE   Specimen: Urine   Urine  Result Value Ref Range   Urine Culture, Comprehensive Final report (A)    Organism ID, Bacteria Escherichia coli (A)    ANTIMICROBIAL SUSCEPTIBILITY Comment   Microscopic Examination   Urine  Result Value Ref Range   WBC, UA 6-10 (A) 0 - 5 /hpf   RBC >30 (A) 0 - 2 /hpf   Epithelial Cells (non renal) 0-10 0 - 10 /hpf   Mucus, UA Present (A) Not Estab.   Bacteria, UA None seen None seen/Few  Urinalysis, Complete  Result Value Ref Range   Specific Gravity, UA >1.030 (H) 1.005 - 1.030   pH, UA 5.5 5.0 - 7.5   Color, UA Brown (A) Yellow   Appearance Ur Cloudy (A) Clear   Leukocytes,UA Negative Negative   Protein,UA 2+ (A) Negative/Trace   Glucose, UA Negative Negative   Ketones, UA Trace (A) Negative   RBC, UA 3+ (A) Negative   Bilirubin, UA Negative Negative   Urobilinogen, Ur 0.2 0.2 - 1.0 mg/dL   Nitrite, UA Negative Negative   Microscopic Examination See below:    Pertinent Imaging: Results for orders placed in visit on 06/22/19  CT RENAL STONE STUDY  Narrative CLINICAL DATA:   44 year old female with flank pain, right lower quadrant pain and nausea. Symptoms since 0300 hours.  EXAM: CT ABDOMEN AND PELVIS WITHOUT CONTRAST  TECHNIQUE: Multidetector CT imaging of the abdomen and pelvis was performed following the standard protocol without IV contrast.  COMPARISON:  CT Abdomen and Pelvis 05/23/2019, 12/29/2018.  FINDINGS: Lower chest: Minimally included, negative.  Hepatobiliary: Absent gallbladder as before. Negative visible noncontrast liver.  Pancreas: Negative.  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  Adrenals/Urinary Tract: Normal adrenal glands. Negative noncontrast left kidney and left ureter aside from hyperdense medullary pyramids.  Right hydronephrosis with mild perinephric stranding. Right hydroureter to the level of the pelvic inlet. And there 6-7 mm ureteral calculus is identified at the crossing of the iliac vessels (series 2, image 50 and coronal image 69. Distal to that the right ureter appears decompressed. Numerous pelvic phleboliths are otherwise stable. Diminutive and unremarkable urinary bladder. No other calculus in the right kidney.  Stomach/Bowel: No dilated large or small bowel loops. Mild retained stool throughout the colon. Normal retrocecal appendix (series 2, image 37). Decompressed visible stomach. No free air or free fluid identified.  Vascular/Lymphatic: Vascular patency is not evaluated in the absence of IV contrast. No calcified atherosclerosis is evident. No lymphadenopathy.  Reproductive: Stable, IUD in place.  Other: No pelvic free fluid.  Musculoskeletal: Stable, negative visible osseous structures. Probable postoperative changes to the ventral abdominal wall with stable  mild stranding.  IMPRESSION: 1. Acute obstructive uropathy on the right due to a 6-7 mm mid right ureteral calculus located at the pelvic inlet. 2. Otherwise stable noncontrast CT appearance of the abdomen and pelvis. Normal appendix.  These results will be called to the ordering clinician or representative by the Radiology Department at the imaging location.   Electronically Signed By: Genevie Ann M.D. On: 06/22/2019 13:29  I personally reviewed the images referenced above and note right hydroureteronephrosis secondary to an obstructing mid right ureteral stone.  Assessment & Plan:   1. Right ureteral stone 44 year old female with PMH nephrolithiasis  now 3 weeks s/p ESWL for treatment of a proximal right ureteral stone with a residual fragment that has now migrated to the mid right ureter. Patient reports acute worsening in pain and nausea associated with this. UA reassuring for infection, will send for culture. She is afebrile, VSS in clinic today. Stone likely not visualized on prior KUB due to its location overlying the sacrum; suspect it may be visualizable again if it migrates inferior to the pelvic brim.  I explained to the patient that her treatment options at this point include continued trial of passage, ureteroscopy with laser lithotripsy and stent placement, and repeat shockwave lithotripsy assuming her stone becomes visible on KUB. I offered her add-on ureteroscopy with laser lithotripsy and stent placement tomorrow. She declined this, stating she would not be able to arrange a ride. I asked if she would be interested in pursuing stone management tomorrow if we were able to arrange a ride for her. She still declined, stating that she was not interested in having another ureteral stent placed as she tolerated this poorly in the past. Ultimately, she expressed interest in undergoing shockwave lithotripsy again. I explained that there is no guarantee that a second ESWL would clear her  stone. She remains interested only in ESWL.  Administered 1 dose of Toradol 30 mg in clinic today. Counseled patient to avoid NSAIDs for 48 hours to reduce the risk of GI bleed. Prescribing Percocet and refilling alfuzosin today for pain control and MET. Counseled patient to continue Zofran as prescribed by her psychiatrist as needed. Counseled her to not take Tylenol and Percocet concurrently. She expressed understanding.  Counseled patient to proceed to the emergency department if she develops uncontrollable pain, nausea, vomiting, fever, or chills. She expressed understanding. - Urinalysis, Complete - CT RENAL STONE STUDY - DG Abd 1 View; Future - oxyCODONE-acetaminophen (PERCOCET) 7.5-325 MG tablet; Take 1 tablet by mouth every 6 (six) hours as needed for severe pain.  Dispense: 12 tablet; Refill: 0 - alfuzosin (UROXATRAL) 10 MG 24 hr tablet; Take 1 tablet (10 mg total) by mouth daily with breakfast.  Dispense: 30 tablet; Refill: 1 - ketorolac (TORADOL) injection 30 mg - CULTURE, URINE COMPREHENSIVE   Return in about 5 days (around 06/27/2019) for Stone follow-up with KUB prior with Dr. Diamantina Providence.  Debroah Loop, PA-C  [facility] [address] [place], [place] [address] [phone]

## 2019-06-22 NOTE — Telephone Encounter (Addendum)
Patient called left VM on Triage line regarding pain and nausea. Returned call, unable to reach patient. Left VM to call the office ASAP.   Appointment scheduled

## 2019-06-23 DIAGNOSIS — F3181 Bipolar II disorder: Secondary | ICD-10-CM | POA: Diagnosis not present

## 2019-06-23 DIAGNOSIS — F900 Attention-deficit hyperactivity disorder, predominantly inattentive type: Secondary | ICD-10-CM | POA: Diagnosis not present

## 2019-06-23 DIAGNOSIS — F4310 Post-traumatic stress disorder, unspecified: Secondary | ICD-10-CM | POA: Diagnosis not present

## 2019-06-23 LAB — CALCULI, WITH PHOTOGRAPH (CLINICAL LAB)
Calcium Oxalate Dihydrate: 60 %
Calcium Oxalate Monohydrate: 40 %
Weight Calculi: 6 mg

## 2019-06-25 LAB — CULTURE, URINE COMPREHENSIVE

## 2019-06-26 ENCOUNTER — Telehealth: Payer: Self-pay | Admitting: Physician Assistant

## 2019-06-26 LAB — URINALYSIS, COMPLETE
Bilirubin, UA: NEGATIVE
Glucose, UA: NEGATIVE
Leukocytes,UA: NEGATIVE
Nitrite, UA: NEGATIVE
Specific Gravity, UA: >1.03 — ABNORMAL HIGH (ref 1.005–1.030)
Urobilinogen, Ur: 0.2 mg/dL (ref 0.2–1.0)
pH, UA: 5.5 (ref 5.0–7.5)

## 2019-06-26 LAB — MICROSCOPIC EXAMINATION
Bacteria, UA: NONE SEEN
RBC, Urine: >30 /hpf — AB (ref 0–2)

## 2019-06-26 MED ORDER — CIPROFLOXACIN HCL 250 MG PO TABS
250.0000 mg | ORAL_TABLET | Freq: Two times a day (BID) | ORAL | 0 refills | Status: AC
Start: 2019-06-26 — End: 2019-07-01

## 2019-06-26 NOTE — Telephone Encounter (Signed)
Attempted to contact patient on 3 phone numbers. See message below.

## 2019-06-26 NOTE — Telephone Encounter (Signed)
Please contact the patient and inform her that I would like to get her started on an antibiotic in advance of a possible ESWL this week.  I have sent Cipro 250 mg twice daily x5 days to the [facility] in [place].

## 2019-06-27 ENCOUNTER — Other Ambulatory Visit: Payer: Self-pay

## 2019-06-27 ENCOUNTER — Ambulatory Visit
Admission: RE | Admit: 2019-06-27 | Discharge: 2019-06-27 | Disposition: A | Discharge status: Home / Self Care | Payer: BC Managed Care – PPO | Source: Ambulatory Visit | Attending: Physician Assistant | Admitting: Physician Assistant

## 2019-06-27 ENCOUNTER — Other Ambulatory Visit
Admission: RE | Admit: 2019-06-27 | Discharge: 2019-06-27 | Disposition: A | Discharge status: Home / Self Care | Payer: BC Managed Care – PPO | Source: Ambulatory Visit | Attending: Urology | Admitting: Urology

## 2019-06-27 ENCOUNTER — Encounter: Payer: Self-pay | Admitting: Urology

## 2019-06-27 ENCOUNTER — Ambulatory Visit
Admission: RE | Admit: 2019-06-27 | Discharge: 2019-06-27 | Disposition: A | Discharge status: Home / Self Care | Payer: BC Managed Care – PPO | Attending: Physician Assistant | Admitting: Physician Assistant

## 2019-06-27 ENCOUNTER — Ambulatory Visit (INDEPENDENT_AMBULATORY_CARE_PROVIDER_SITE_OTHER): Payer: BC Managed Care – PPO | Admitting: Urology

## 2019-06-27 VITALS — BP 137/92 | HR 120 | Ht 64.0 in | Wt 174.0 lb

## 2019-06-27 DIAGNOSIS — N200 Calculus of kidney: Secondary | ICD-10-CM

## 2019-06-27 DIAGNOSIS — N201 Calculus of ureter: Secondary | ICD-10-CM | POA: Insufficient documentation

## 2019-06-27 DIAGNOSIS — Z20822 Contact with and (suspected) exposure to covid-19: Secondary | ICD-10-CM | POA: Diagnosis not present

## 2019-06-27 LAB — SARS CORONAVIRUS 2 (TAT 6-24 HRS): SARS Coronavirus 2: NEGATIVE

## 2019-06-27 MED ORDER — KETOROLAC TROMETHAMINE 10 MG PO TABS: 10.0000 mg | ORAL_TABLET | Freq: Four times a day (QID) | ORAL | 0 refills | Status: DC | PRN

## 2019-06-27 NOTE — Telephone Encounter (Signed)
Notified Rose [name] to pickup her ABX. Patient agrees she will start this prescription this afternoon.

## 2019-06-27 NOTE — Progress Notes (Signed)
   06/27/2019 11:29 AM   [place] Dean Rolston [birth year] [id number]  Reason for visit: Right mid ureteral stone  HPI: I saw Ms. Thai in urology clinic for a 6 mm right mid ureteral stone.  To briefly summarize, she is a 44 year old female who was originally treated by Dr. Bernardo Heater in January 2021 with ureteroscopy/laser/stent for a left 5 mm ureteral stone.  She did not tolerate her stent well and had severe stent symptoms of flank pain, urgency, frequency.  She was then seen by our PA in mid April with a 7 mm right proximal ureteral stone, and she opted for shockwave lithotripsy.  She she underwent SWL with me on 06/01/2019 and passed a number of small fragments, however have persistent nausea and right-sided flank pain and ultimately a CT was repeated on 06/22/2019 that showed a residual 6 mm right mid to distal ureteral fragment.  This could not be visualized on KUB at that time, or KUB today.  She continues to have bothersome symptoms of nausea and right-sided flank pain.  She denies any dysuria, fevers, or chills.  Urinalysis on 06/22/2019 was relatively benign, however urine culture showed 7K E. Coli, and she was started on culture appropriate Cipro.  We discussed treatment options at length today.  She is not a good candidate for shockwave, as her stone cannot be clearly seen on KUB, and she is already failed one shockwave procedure previously.  She does not want to pursue observation with her ongoing right-sided pain and nausea.  She also has an upcoming trip to [place] in the next few weeks and would like to have this treated before then.  I recommended ureteroscopy and laser lithotripsy, and we will try to avoid a stent if at all possible.  We discussed that I cannot guarantee that she will not need a stent, and we discussed stent related symptoms at length.  She is amenable to moving forward with ureteroscopy. We specifically discussed the risks ureteroscopy including bleeding, infection/sepsis,  stent related symptoms including flank pain/urgency/frequency/incontinence/dysuria, ureteral injury, inability to access stone, or need for staged or additional procedures.  Schedule right ureteroscopy, laser, possible stent this Thursday Will need [id number] urine metabolic work-up and follow-up, calcium oxalate stone type  Billey Co, MD  [facility] [address] [place], [place] [address] [phone]

## 2019-06-27 NOTE — Patient Instructions (Signed)
Ureteroscopy Ureteroscopy is a procedure to check for and treat problems inside part of the urinary tract. In this procedure, a thin, tube-shaped instrument with a light at the end (ureteroscope) is used to look at the inside of the kidneys and the ureters, which are the tubes that carry urine from the kidneys to the bladder. The ureteroscope is inserted into one or both of the ureters. You may need this procedure if you have frequent urinary tract infections (UTIs), blood in your urine, or a stone in one of your ureters. A ureteroscopy can be done to find the cause of urine blockage in a ureter and to evaluate other abnormalities inside the ureters or kidneys. If stones are found, they can be removed during the procedure. Polyps, abnormal tissue, and some types of tumors can also be removed or treated. The ureteroscope may also have a tool to remove tissue to be checked for disease under a microscope (biopsy). Tell a health care provider about:  Any allergies you have.  All medicines you are taking, including vitamins, herbs, eye drops, creams, and over-the-counter medicines.  Any problems you or family members have had with anesthetic medicines.  Any blood disorders you have.  Any surgeries you have had.  Any medical conditions you have.  Whether you are pregnant or may be pregnant. What are the risks? Generally, this is a safe procedure. However, problems may occur, including:  Bleeding.  Infection.  Allergic reactions to medicines.  Scarring that narrows the ureter (stricture).  Creating a hole in the ureter (perforation). What happens before the procedure? Staying hydrated Follow instructions from your health care provider about hydration, which may include:  Up to 2 hours before the procedure - you may continue to drink clear liquids, such as water, clear fruit juice, black coffee, and plain tea. Eating and drinking restrictions Follow instructions from your health care  provider about eating and drinking, which may include:  8 hours before the procedure - stop eating heavy meals or foods such as meat, fried foods, or fatty foods.  6 hours before the procedure - stop eating light meals or foods, such as toast or cereal.  6 hours before the procedure - stop drinking milk or drinks that contain milk.  2 hours before the procedure - stop drinking clear liquids. Medicines  Ask your health care provider about: ? Changing or stopping your regular medicines. This is especially important if you are taking diabetes medicines or blood thinners. ? Taking medicines such as aspirin and ibuprofen. These medicines can thin your blood. Do not take these medicines before your procedure if your health care provider instructs you not to.  You may be given antibiotic medicine to help prevent infection. General instructions  You may have a urine sample taken to check for infection.  Plan to have someone take you home from the hospital or clinic. What happens during the procedure?   To reduce your risk of infection: ? Your health care team will wash or sanitize their hands. ? Your skin will be washed with soap.  An IV tube will be inserted into one of your veins.  You will be given one of the following: ? A medicine to help you relax (sedative). ? A medicine to make you fall asleep (general anesthetic). ? A medicine that is injected into your spine to numb the area below and slightly above the injection site (spinal anesthetic).  To lower your risk of infection, you may be given an antibiotic medicine   by an injection or through the IV tube.  The opening from which you urinate (urethra) will be cleaned with a germ-killing solution.  The ureteroscope will be passed through your urethra into your bladder.  A salt-water solution will flow through the ureteroscope to fill your bladder. This will help the health care provider see the openings of your ureters more  clearly.  Then, the ureteroscope will be passed into your ureter. ? If a growth is found, a piece of it may be removed so it can be examined under a microscope (biopsy). ? If a stone is found, it may be removed through the ureteroscope, or the stone may be broken up using a laser, shock waves, or electrical energy. ? In some cases, if the ureter is too small, a tube may be inserted that keeps the ureter open (ureteral stent). The stent may be left in place for 1 or 2 weeks to keep the ureter open, and then the ureteroscopy procedure will be performed.  The scope will be removed, and your bladder will be emptied. The procedure may vary among health care providers and hospitals. What happens after the procedure?  Your blood pressure, heart rate, breathing rate, and blood oxygen level will be monitored until the medicines you were given have worn off.  You may be asked to urinate.  Donot drive for 24 hours if you were given a sedative. This information is not intended to replace advice given to you by your health care provider. Make sure you discuss any questions you have with your health care provider. Document Revised: 12/04/2016 Document Reviewed: 10/04/2015 Elsevier Patient Education  2020 Elsevier Inc.  

## 2019-06-28 ENCOUNTER — Other Ambulatory Visit: Payer: Self-pay | Admitting: Urology

## 2019-06-28 DIAGNOSIS — N201 Calculus of ureter: Secondary | ICD-10-CM

## 2019-06-29 ENCOUNTER — Ambulatory Visit: Payer: BC Managed Care – PPO | Admitting: Anesthesiology

## 2019-06-29 ENCOUNTER — Ambulatory Visit: Payer: BC Managed Care – PPO

## 2019-06-29 ENCOUNTER — Other Ambulatory Visit: Payer: Self-pay

## 2019-06-29 ENCOUNTER — Encounter: Payer: Self-pay | Admitting: Urology

## 2019-06-29 ENCOUNTER — Encounter
Admission: RE | Disposition: A | Discharge status: Home / Self Care | Payer: Self-pay | Source: Home / Self Care | Attending: Urology

## 2019-06-29 ENCOUNTER — Ambulatory Visit
Admission: RE | Admit: 2019-06-29 | Discharge: 2019-06-29 | Disposition: A | Discharge status: Home / Self Care | Payer: BC Managed Care – PPO | Attending: Urology | Admitting: Urology

## 2019-06-29 ENCOUNTER — Telehealth: Payer: Self-pay

## 2019-06-29 DIAGNOSIS — N132 Hydronephrosis with renal and ureteral calculous obstruction: Secondary | ICD-10-CM | POA: Insufficient documentation

## 2019-06-29 DIAGNOSIS — E119 Type 2 diabetes mellitus without complications: Secondary | ICD-10-CM | POA: Diagnosis not present

## 2019-06-29 DIAGNOSIS — N200 Calculus of kidney: Secondary | ICD-10-CM | POA: Diagnosis not present

## 2019-06-29 DIAGNOSIS — N201 Calculus of ureter: Secondary | ICD-10-CM | POA: Diagnosis not present

## 2019-06-29 DIAGNOSIS — F419 Anxiety disorder, unspecified: Secondary | ICD-10-CM | POA: Diagnosis not present

## 2019-06-29 DIAGNOSIS — K219 Gastro-esophageal reflux disease without esophagitis: Secondary | ICD-10-CM | POA: Insufficient documentation

## 2019-06-29 DIAGNOSIS — Z87891 Personal history of nicotine dependence: Secondary | ICD-10-CM | POA: Insufficient documentation

## 2019-06-29 HISTORY — PX: CYSTOSCOPY W/ RETROGRADES: SHX1426

## 2019-06-29 HISTORY — PX: CYSTOSCOPY/URETEROSCOPY/HOLMIUM LASER/STENT PLACEMENT: SHX6546

## 2019-06-29 LAB — GLUCOSE, CAPILLARY
Glucose-Capillary: 125 mg/dL — ABNORMAL HIGH (ref 70–99)
Glucose-Capillary: 106 mg/dL — ABNORMAL HIGH (ref 70–99)

## 2019-06-29 LAB — POCT PREGNANCY, URINE: Preg Test, Ur: NEGATIVE

## 2019-06-29 LAB — URINE DRUG SCREEN, QUALITATIVE (ARMC ONLY)
Amphetamines, Ur Screen: POSITIVE — AB
Barbiturates, Ur Screen: NOT DETECTED
Benzodiazepine, Ur Scrn: NOT DETECTED
Cannabinoid 50 Ng, Ur ~~LOC~~: NOT DETECTED
Cocaine Metabolite,Ur ~~LOC~~: NOT DETECTED
MDMA (Ecstasy)Ur Screen: NOT DETECTED
Methadone Scn, Ur: NOT DETECTED
Opiate, Ur Screen: NOT DETECTED
Phencyclidine (PCP) Ur S: NOT DETECTED
Tricyclic, Ur Screen: NOT DETECTED

## 2019-06-29 SURGERY — CYSTOSCOPY/URETEROSCOPY/HOLMIUM LASER/STENT PLACEMENT
Anesthesia: General | Laterality: Right

## 2019-06-29 MED ORDER — FENTANYL CITRATE (PF) 100 MCG/2ML IJ SOLN
INTRAMUSCULAR | Status: AC
  Filled 2019-06-29: qty 2

## 2019-06-29 MED ORDER — FENTANYL CITRATE (PF) 100 MCG/2ML IJ SOLN: 25.0000 ug | INTRAMUSCULAR | Status: DC | PRN

## 2019-06-29 MED ORDER — ROCURONIUM BROMIDE 100 MG/10ML IV SOLN
INTRAVENOUS | Status: DC | PRN
  Administered 2019-06-29: 50 mg via INTRAVENOUS

## 2019-06-29 MED ORDER — PHENYLEPHRINE HCL (PRESSORS) 10 MG/ML IV SOLN
INTRAVENOUS | Status: DC | PRN
  Administered 2019-06-29 (×2): 100 ug via INTRAVENOUS
  Administered 2019-06-29: 200 ug via INTRAVENOUS
  Administered 2019-06-29: 100 ug via INTRAVENOUS

## 2019-06-29 MED ORDER — CEFAZOLIN SODIUM-DEXTROSE 2-4 GM/100ML-% IV SOLN
2.0000 g | INTRAVENOUS | Status: AC
  Administered 2019-06-29: 2 g via INTRAVENOUS

## 2019-06-29 MED ORDER — CEFAZOLIN SODIUM-DEXTROSE 2-4 GM/100ML-% IV SOLN
INTRAVENOUS | Status: AC
  Filled 2019-06-29: qty 100

## 2019-06-29 MED ORDER — ACETAMINOPHEN 10 MG/ML IV SOLN
INTRAVENOUS | Status: AC
  Filled 2019-06-29: qty 100

## 2019-06-29 MED ORDER — KETOROLAC TROMETHAMINE 30 MG/ML IJ SOLN
INTRAMUSCULAR | Status: DC | PRN
  Administered 2019-06-29: 30 mg via INTRAVENOUS

## 2019-06-29 MED ORDER — ORAL CARE MOUTH RINSE: 15.0000 mL | Freq: Once | OROMUCOSAL | Status: AC

## 2019-06-29 MED ORDER — ONDANSETRON HCL 4 MG/2ML IJ SOLN
INTRAMUSCULAR | Status: DC | PRN
  Administered 2019-06-29: 4 mg via INTRAVENOUS

## 2019-06-29 MED ORDER — PROPOFOL 10 MG/ML IV BOLUS
INTRAVENOUS | Status: AC
  Filled 2019-06-29: qty 20

## 2019-06-29 MED ORDER — FAMOTIDINE 20 MG PO TABS
ORAL_TABLET | ORAL | Status: AC
  Administered 2019-06-29: 20 mg
  Filled 2019-06-29: qty 1

## 2019-06-29 MED ORDER — BELLADONNA ALKALOIDS-OPIUM 16.2-60 MG RE SUPP
RECTAL | Status: DC | PRN
  Administered 2019-06-29: 1 via RECTAL

## 2019-06-29 MED ORDER — MIDAZOLAM HCL 2 MG/2ML IJ SOLN
INTRAMUSCULAR | Status: DC | PRN
  Administered 2019-06-29 (×2): 1 mg via INTRAVENOUS

## 2019-06-29 MED ORDER — OXYBUTYNIN CHLORIDE ER 10 MG PO TB24
10.0000 mg | ORAL_TABLET | Freq: Every day | ORAL | 0 refills | Status: AC | PRN
Start: 2019-06-29 — End: 2019-07-04

## 2019-06-29 MED ORDER — MIDAZOLAM HCL 2 MG/2ML IJ SOLN
INTRAMUSCULAR | Status: AC
  Filled 2019-06-29: qty 2

## 2019-06-29 MED ORDER — ACETAMINOPHEN 10 MG/ML IV SOLN
INTRAVENOUS | Status: DC | PRN
  Administered 2019-06-29: 1000 mg via INTRAVENOUS

## 2019-06-29 MED ORDER — CHLORHEXIDINE GLUCONATE 0.12 % MT SOLN: 15.0000 mL | Freq: Once | OROMUCOSAL | Status: AC

## 2019-06-29 MED ORDER — SODIUM CHLORIDE 0.9 % IV SOLN: INTRAVENOUS | Status: DC

## 2019-06-29 MED ORDER — ACETAMINOPHEN 325 MG PO TABS: 325.0000 mg | ORAL_TABLET | ORAL | Status: DC | PRN

## 2019-06-29 MED ORDER — IOHEXOL 180 MG/ML  SOLN
INTRAMUSCULAR | Status: DC | PRN
  Administered 2019-06-29: 20 mL

## 2019-06-29 MED ORDER — LACTATED RINGERS IV SOLN: INTRAVENOUS | Status: DC

## 2019-06-29 MED ORDER — BELLADONNA ALKALOIDS-OPIUM 16.2-60 MG RE SUPP
RECTAL | Status: AC
  Filled 2019-06-29: qty 1

## 2019-06-29 MED ORDER — HYDROCODONE-ACETAMINOPHEN 7.5-325 MG PO TABS
1.0000 | ORAL_TABLET | Freq: Once | ORAL | Status: DC | PRN
  Filled 2019-06-29: qty 1

## 2019-06-29 MED ORDER — LIDOCAINE HCL (PF) 2 % IJ SOLN
INTRAMUSCULAR | Status: AC
  Filled 2019-06-29: qty 5

## 2019-06-29 MED ORDER — LIDOCAINE HCL (CARDIAC) PF 100 MG/5ML IV SOSY
PREFILLED_SYRINGE | INTRAVENOUS | Status: DC | PRN
  Administered 2019-06-29: 100 mg via INTRAVENOUS

## 2019-06-29 MED ORDER — FENTANYL CITRATE (PF) 100 MCG/2ML IJ SOLN
INTRAMUSCULAR | Status: DC | PRN
  Administered 2019-06-29: 50 ug via INTRAVENOUS

## 2019-06-29 MED ORDER — PROMETHAZINE HCL 25 MG/ML IJ SOLN: 6.2500 mg | INTRAMUSCULAR | Status: DC | PRN

## 2019-06-29 MED ORDER — DEXAMETHASONE SODIUM PHOSPHATE 10 MG/ML IJ SOLN
INTRAMUSCULAR | Status: DC | PRN
  Administered 2019-06-29: 8 mg via INTRAVENOUS

## 2019-06-29 MED ORDER — CHLORHEXIDINE GLUCONATE 0.12 % MT SOLN
OROMUCOSAL | Status: AC
  Administered 2019-06-29: 15 mL via OROMUCOSAL
  Filled 2019-06-29: qty 15

## 2019-06-29 MED ORDER — OXYCODONE-ACETAMINOPHEN 5-325 MG PO TABS: 1.0000 | ORAL_TABLET | ORAL | 0 refills | Status: AC | PRN

## 2019-06-29 MED ORDER — LACTATED RINGERS IV SOLN: INTRAVENOUS | Status: DC | PRN

## 2019-06-29 MED ORDER — SUGAMMADEX SODIUM 200 MG/2ML IV SOLN
INTRAVENOUS | Status: DC | PRN
  Administered 2019-06-29: 200 mg via INTRAVENOUS

## 2019-06-29 MED ORDER — ACETAMINOPHEN 160 MG/5ML PO SOLN
325.0000 mg | ORAL | Status: DC | PRN
  Filled 2019-06-29: qty 20.3

## 2019-06-29 MED ORDER — PROPOFOL 10 MG/ML IV BOLUS
INTRAVENOUS | Status: DC | PRN
  Administered 2019-06-29: 160 mg via INTRAVENOUS

## 2019-06-29 SURGICAL SUPPLY — 32 items
BAG DRAIN CYSTO-URO LG1000N (MISCELLANEOUS) ×4 IMPLANT
BASKET ZERO TIP 1.9FR (BASKET) ×4 IMPLANT
BRUSH SCRUB EZ 1% IODOPHOR (MISCELLANEOUS) ×4 IMPLANT
CATH URETL 5X70 OPEN END (CATHETERS) ×4 IMPLANT
CNTNR SPEC 2.5X3XGRAD LEK (MISCELLANEOUS)
CONT SPEC 4OZ STER OR WHT (MISCELLANEOUS)
CONT SPEC 4OZ STRL OR WHT (MISCELLANEOUS)
CONTAINER SPEC 2.5X3XGRAD LEK (MISCELLANEOUS) IMPLANT
DRAPE UTILITY 15X26 TOWEL STRL (DRAPES) ×4 IMPLANT
FIBER LASER TRAC TIP (UROLOGICAL SUPPLIES) ×4 IMPLANT
GLOVE BIOGEL PI IND STRL 7.5 (GLOVE) ×2 IMPLANT
GLOVE BIOGEL PI INDICATOR 7.5 (GLOVE) ×2
GOWN STRL REUS W/ TWL LRG LVL3 (GOWN DISPOSABLE) ×2 IMPLANT
GOWN STRL REUS W/ TWL XL LVL3 (GOWN DISPOSABLE) ×2 IMPLANT
GOWN STRL REUS W/TWL LRG LVL3 (GOWN DISPOSABLE) ×4
GOWN STRL REUS W/TWL XL LVL3 (GOWN DISPOSABLE) ×4
GUIDEWIRE GREEN .038 145CM (MISCELLANEOUS) ×4 IMPLANT
GUIDEWIRE STR DUAL SENSOR (WIRE) ×4 IMPLANT
INFUSOR MANOMETER BAG 3000ML (MISCELLANEOUS) ×4 IMPLANT
INTRODUCER DILATOR DOUBLE (INTRODUCER) IMPLANT
KIT TURNOVER CYSTO (KITS) ×4 IMPLANT
PACK CYSTO AR (MISCELLANEOUS) ×4 IMPLANT
SET CYSTO W/LG BORE CLAMP LF (SET/KITS/TRAYS/PACK) ×4 IMPLANT
SHEATH URETERAL 12FRX35CM (MISCELLANEOUS) IMPLANT
SOL .9 NS 3000ML IRR  AL (IV SOLUTION) ×4
SOL .9 NS 3000ML IRR UROMATIC (IV SOLUTION) ×2 IMPLANT
STENT URET 6FRX24 CONTOUR (STENTS) ×4 IMPLANT
STENT URET 6FRX26 CONTOUR (STENTS) IMPLANT
SURGILUBE 2OZ TUBE FLIPTOP (MISCELLANEOUS) ×4 IMPLANT
SYR 10ML LL (SYRINGE) ×4 IMPLANT
VALVE UROSEAL ADJ ENDO (VALVE) IMPLANT
WATER STERILE IRR 1000ML POUR (IV SOLUTION) ×4 IMPLANT

## 2019-06-29 NOTE — Op Note (Signed)
Date of procedure: 06/29/19  Preoperative diagnosis:  1. Right distal ureteral stone  Postoperative diagnosis:  1. Same  Procedure: 1. Cystoscopy, right retrograde pyelogram with intraoperative interpretation, right ureteroscopy, laser lithotripsy, basket extraction of fragments, right ureteral stent placement  Surgeon: Nickolas Madrid, MD  Anesthesia: General  Complications: None  Intraoperative findings:  1.  No suspicious bladder lesions 2.  Yellow stone emanating from right ureteral orifice 3.  Challenging removal of right distal ureteral stone, all fragments irrigated free  EBL: None  Specimens: None  Drains: Right 6 French by 24 cm ureteral stent on Dangler  Indication: Rose [name] is a 44 y.o. patient with persistent 6 mm right distal ureteral stone after undergoing shockwave lithotripsy who presents today for definitive management.  After reviewing the management options for treatment, they elected to proceed with the above surgical procedure(s). We have discussed the potential benefits and risks of the procedure, side effects of the proposed treatment, the likelihood of the patient achieving the goals of the procedure, and any potential problems that might occur during the procedure or recuperation. Informed consent has been obtained.  Description of procedure:  The patient was taken to the operating room and general anesthesia was induced. SCDs were placed for DVT prophylaxis. The patient was placed in the dorsal lithotomy position, prepped and draped in the usual sterile fashion, and preoperative antibiotics were administered. A preoperative time-out was performed.   A 21 French rigid cystoscope was used to intubate the urethra and thorough cystoscopy was performed.  There were no suspicious bladder lesions.  There was a yellow stone emanating from the right ureteral orifice.  I attempted to pass a sensor wire alongside the stone, however the stone was completely  lodged circumferentially in the ureteral orifice and there was no way to pass a wire alongside it.  I attempted to basket the stone, but met significant resistance trying to dislodge it.  I then used the 200 m laser fiber on settings of 1.0 J and 10 Hz to meticulously fragment the visible portions of the stone.  I was then with significant difficulty ultimately able to use a 5 [place] access catheter to gently push the stone back into the distal ureter and perform a retrograde pyelogram.  This showed no extravasation of contrast, and a hydronephrotic ureter and kidney.  I was able to advance a sensor wire through the access catheter, alongside the stone, up into the kidney.  The semirigid ureteroscope was then advanced alongside the wire easily into the distal ureter and the residual 5 mm stone was fragmented to dust.  A basket was used to remove the largest remaining fragment after the smaller fragments had been irrigated free.  Thorough ureteroscopy revealed no residual fragments.  There was some moderate edema at the ureteral orifice and I opted to leave a stent.  A 6 French by 24 cm ureteral stent with Dangler attached was placed under fluoroscopic vision with a curl in the renal pelvis, as well as in the bladder.  The bladder was drained.  The Dangler was secured to the right inner thigh with Tegaderm.  Belladonna suppository was placed.  Disposition: Stable to PACU  Plan: Stent removal in 3 days Follow-up in clinic in 6 to 8 weeks for renal ultrasound and 24-hour urine collection  Nickolas Madrid, MD

## 2019-06-29 NOTE — Anesthesia Procedure Notes (Signed)
Procedure Name: Intubation Date/Time: 06/29/2019 7:40 AM Performed by: Allean Found, CRNA Pre-anesthesia Checklist: Patient identified, Patient being monitored, Timeout performed, Emergency Drugs available and Suction available Patient Re-evaluated:Patient Re-evaluated prior to induction Oxygen Delivery Method: Circle system utilized Preoxygenation: Pre-oxygenation with 100% oxygen Induction Type: IV induction Ventilation: Mask ventilation without difficulty Laryngoscope Size: 3 and McGraph Grade View: Grade I Tube type: Oral Tube size: 7.0 mm Number of attempts: 1 Airway Equipment and Method: Stylet Placement Confirmation: ETT inserted through vocal cords under direct vision,  positive ETCO2 and breath sounds checked- equal and bilateral Secured at: 21 cm Tube secured with: Tape Dental Injury: Teeth and Oropharynx as per pre-operative assessment

## 2019-06-29 NOTE — Telephone Encounter (Signed)
-----   Message from Billey Co, MD sent at 06/29/2019  8:28 AM EDT ----- Regarding: follow up Please schedule follow up in 8 weeks with renal US prior, as well as 24 hour urine collection, thanks  Nickolas Madrid, MD 06/29/2019

## 2019-06-29 NOTE — Anesthesia Preprocedure Evaluation (Addendum)
Anesthesia Evaluation  Patient identified by MRN, date of birth, ID band Patient awake    Reviewed: Allergy & Precautions, H&P , NPO status , reviewed documented beta blocker date and time   History of Anesthesia Complications (+) Family history of anesthesia reaction  Airway Mallampati: II  TM Distance: >3 FB Neck ROM: full    Dental  (+) Teeth Intact Has few wooden straws to replace piercings:   Pulmonary former smoker,    Pulmonary exam normal        Cardiovascular Normal cardiovascular exam     Neuro/Psych PSYCHIATRIC DISORDERS Anxiety    GI/Hepatic GERD  Medicated and Controlled,  Endo/Other  diabetes  Renal/GU      Musculoskeletal   Abdominal   Peds  Hematology   Anesthesia Other Findings Past Medical History: No date: Anxiety No date: Diabetes mellitus without complication (HCC) No date: Family history of adverse reaction to anesthesia     Comment:  SISTER-TOO MUCH ANESTHESIA FOR C-SECTION (made her sleepy) No date: GERD (gastroesophageal reflux disease) No date: History of kidney stones No date: Neck pain No date: Right leg pain Past Surgical History: No date: CESAREAN SECTION No date: CHOLECYSTECTOMY 01/17/2019: CYSTOSCOPY WITH RETROGRADE PYELOGRAM, URETEROSCOPY AND  STENT PLACEMENT; Left     Comment:  Procedure: CYSTOSCOPY WITH RETROGRADE PYELOGRAM,               URETEROSCOPY AND STENT PLACEMENT;  Surgeon: Abbie Sons, MD;  Location: [facility];  Service: Urology;                Laterality: Left; 06/01/2019: EXTRACORPOREAL SHOCK WAVE LITHOTRIPSY; Right     Comment:  Procedure: EXTRACORPOREAL SHOCK WAVE LITHOTRIPSY (ESWL);              Surgeon: Billey Co, MD;  Location: [facility];                Service: Urology;  Laterality: Right; 08/24/2013: LAPAROSCOPY; N/A     Comment:  Procedure: LAPAROSCOPY OPERATIVE with lysis of adhesions              ;  Surgeon: Marylynn Pearson, MD;   Location: [facility];                Service: Gynecology;  Laterality: N/A; 01/17/2019: STONE EXTRACTION WITH BASKET; Left     Comment:  Procedure: STONE EXTRACTION WITH BASKET;  Surgeon:               Abbie Sons, MD;  Location: [facility];  Service:               Urology;  Laterality: Left; No date: wrist, cyst removal   Reproductive/Obstetrics                            Anesthesia Physical Anesthesia Plan  ASA: II  Anesthesia Plan: General   Post-op Pain Management:    Induction: Intravenous  PONV Risk Score and Plan: 3 and Ondansetron, Treatment may vary due to age or medical condition and Midazolam  Airway Management Planned: LMA  Additional Equipment:   Intra-op Plan:   Post-operative Plan: Extubation in OR  Informed Consent: I have reviewed the patients History and Physical, chart, labs and discussed the procedure including the risks, benefits and alternatives for the proposed anesthesia with the patient or authorized representative who has indicated his/her  understanding and acceptance.     Dental Advisory Given  Plan Discussed with: CRNA  Anesthesia Plan Comments: (Poss ETT)       Anesthesia Quick Evaluation

## 2019-06-29 NOTE — Anesthesia Postprocedure Evaluation (Signed)
Anesthesia Post Note  Patient: Rose [name]  Procedure(s) Performed: CYSTOSCOPY/URETEROSCOPY/HOLMIUM LASER/STENT PLACEMENT (Right ) CYSTOSCOPY WITH RETROGRADE PYELOGRAM  Patient location during evaluation: PACU Anesthesia Type: General Level of consciousness: awake and alert Pain management: pain level controlled Vital Signs Assessment: post-procedure vital signs reviewed and stable Respiratory status: spontaneous breathing, nonlabored ventilation and respiratory function stable Cardiovascular status: blood pressure returned to baseline and stable Postop Assessment: no apparent nausea or vomiting Anesthetic complications: no   No complications documented.   Last Vitals:  Vitals:   06/29/19 0911 06/29/19 0923  BP:  (!) 127/93  Pulse:  90  Resp: 10 20  Temp: 36.6 C (!) 36.1 C  SpO2: 95% 97%    Last Pain:  Vitals:   06/29/19 0923  TempSrc: Temporal  PainSc: 0-No pain                 Alphonsus Sias

## 2019-06-29 NOTE — H&P (Signed)
06/29/19 7:06 AM   Rose [name] [birth year] [id number]  CC: right ureteral stone  HPI: She is a 44 year old female who was originally treated by Dr. Bernardo Heater in January 2021 with ureteroscopy/laser/stent for a left 5 mm ureteral stone.  She did not tolerate her stent well and had severe stent symptoms of flank pain, urgency, frequency.  She was then seen by our PA in mid April with a 7 mm right proximal ureteral stone, and she opted for shockwave lithotripsy.  She she underwent SWL with me on 06/01/2019 and passed a number of small fragments, however have persistent nausea and right-sided flank pain and ultimately a CT was repeated on 06/22/2019 that showed a residual 6 mm right mid to distal ureteral fragment.  This could not be visualized on KUB at that time, or KUB today.  She continues to have bothersome symptoms of nausea and right-sided flank pain.  She denies any dysuria, fevers, or chills.  Urinalysis on 06/22/2019 was relatively benign, however urine culture showed 7K E. Coli, and she was started on culture appropriate Cipro.     PMH: Past Medical History:  Diagnosis Date  . Anxiety   . Diabetes mellitus without complication ([place])   . Family history of adverse reaction to anesthesia    SISTER-TOO MUCH ANESTHESIA FOR C-SECTION  . GERD (gastroesophageal reflux disease)   . History of kidney stones   . Neck pain   . Right leg pain     Surgical History: Past Surgical History:  Procedure Laterality Date  . CESAREAN SECTION    . CHOLECYSTECTOMY    . CYSTOSCOPY WITH RETROGRADE PYELOGRAM, URETEROSCOPY AND STENT PLACEMENT Left 01/17/2019   Procedure: CYSTOSCOPY WITH RETROGRADE PYELOGRAM, URETEROSCOPY AND STENT PLACEMENT;  Surgeon: Abbie Sons, MD;  Location: [facility];  Service: Urology;  Laterality: Left;  . EXTRACORPOREAL SHOCK WAVE LITHOTRIPSY Right 06/01/2019   Procedure: EXTRACORPOREAL SHOCK WAVE LITHOTRIPSY (ESWL);  Surgeon: Billey Co, MD;  Location: [facility];   Service: Urology;  Laterality: Right;  . LAPAROSCOPY N/A 08/24/2013   Procedure: LAPAROSCOPY OPERATIVE with lysis of adhesions ;  Surgeon: Marylynn Pearson, MD;  Location: [facility];  Service: Gynecology;  Laterality: N/A;  . STONE EXTRACTION WITH BASKET Left 01/17/2019   Procedure: STONE EXTRACTION WITH BASKET;  Surgeon: Abbie Sons, MD;  Location: [facility];  Service: Urology;  Laterality: Left;  . wrist, cyst removal      Family History: Family History  Problem Relation Age of Onset  . Aneurysm Mother   . Blindness Father   . High blood pressure Father   . Diabetes Neg Hx     Social History:  reports that she has quit smoking. Her smoking use included cigarettes. She quit after 1.00 year of use. She has never used smokeless tobacco. She reports current alcohol use of about 1.0 standard drink of alcohol per week. She reports current drug use. Drug: Marijuana.  Physical Exam:  Constitutional:  Alert and oriented, No acute distress. Cardiovascular: RRR Respiratory: CTA bilaterally GI: Abdomen is soft, nontender, nondistended, no abdominal masses  Laboratory Data: See HPI  Assessment & Plan:   We discussed treatment options at length again.  She is not a good candidate for shockwave, as her stone cannot be clearly seen on KUB, and she is already failed one shockwave procedure previously.  She does not want to pursue observation with her ongoing right-sided pain and nausea.  She also has an upcoming trip to [place] in the next few  weeks and would like to have this treated before then.  I recommended ureteroscopy and laser lithotripsy, and we will try to avoid a stent if at all possible.  We discussed that I cannot guarantee that she will not need a stent, and we discussed stent related symptoms at length.  She is amenable to moving forward with ureteroscopy. We specifically discussed the risks ureteroscopy including bleeding, infection/sepsis, stent related symptoms including flank  pain/urgency/frequency/incontinence/dysuria, ureteral injury, inability to access stone, or need for staged or additional procedures.  Right URS/LL/possible stent  Nickolas Madrid, MD 06/29/2019  [facility] [address] [place], [place] [address] [phone]

## 2019-06-29 NOTE — Transfer of Care (Signed)
Immediate Anesthesia Transfer of Care Note  Patient: Rose [name]  Procedure(s) Performed: CYSTOSCOPY/URETEROSCOPY/HOLMIUM LASER/STENT PLACEMENT (Right ) CYSTOSCOPY WITH RETROGRADE PYELOGRAM  Patient Location: PACU  Anesthesia Type:General  Level of Consciousness: awake, alert  and oriented  Airway & Oxygen Therapy: Patient Spontanous Breathing and Patient connected to face mask oxygen  Post-op Assessment: Report given to RN and Post -op Vital signs reviewed and stable  Post vital signs: Reviewed and stable  Last Vitals:  Vitals Value Taken Time  BP 115/84 06/29/19 0836  Temp 36.2 C 06/29/19 0836  Pulse 95 06/29/19 0838  Resp 23 06/29/19 0838  SpO2 94 % 06/29/19 0838  Vitals shown include unvalidated device data.  Last Pain:  Vitals:   06/29/19 0711  PainSc: 3          Complications: No complications documented.

## 2019-06-29 NOTE — Discharge Instructions (Signed)

## 2019-06-29 NOTE — Telephone Encounter (Signed)
Appt scheduled. 24hr urine ordered. Renal u/s ordered. Instructions mailed and sent via mychart.

## 2019-06-29 NOTE — Telephone Encounter (Signed)
Incoming call from pt who questions if she has pulled her stent out as it was attached to her underwear. Pt states that she cannot see any blue on the stent, only the thin black strings. Advised pt that based on what she is saying stent is in the correct place. She states she is mostly concerned about a small amount of urine leakage. Advised pt that this is likely a bladder spasm, and advised pt to take oxybutynin as prescribed. Advised pt to call us back in the morning if symtptoms worsen so that we can reevaluate. Advised pt to watch for fever, nausea, and chills.  Pt voiced understanding, will back back in the morning.

## 2019-06-30 ENCOUNTER — Encounter: Payer: Self-pay | Admitting: Urology

## 2019-07-03 ENCOUNTER — Ambulatory Visit: Payer: BC Managed Care – PPO

## 2019-07-03 ENCOUNTER — Other Ambulatory Visit: Payer: Self-pay

## 2019-07-03 DIAGNOSIS — N200 Calculus of kidney: Secondary | ICD-10-CM

## 2019-07-03 NOTE — Progress Notes (Signed)
Patient present today for a string stent removal. String was visualized, taped to inner left thigh. Stent was removed with out difficulty. Patient tolerated well, and was instructed to keep follow up as scheduled. 24 hour urine instructions were given again for patient as a reminder.

## 2019-07-07 DIAGNOSIS — F3181 Bipolar II disorder: Secondary | ICD-10-CM | POA: Diagnosis not present

## 2019-07-07 DIAGNOSIS — F4310 Post-traumatic stress disorder, unspecified: Secondary | ICD-10-CM | POA: Diagnosis not present

## 2019-07-07 DIAGNOSIS — F900 Attention-deficit hyperactivity disorder, predominantly inattentive type: Secondary | ICD-10-CM | POA: Diagnosis not present

## 2019-07-17 ENCOUNTER — Ambulatory Visit: Payer: Self-pay | Admitting: Physician Assistant

## 2019-07-19 DIAGNOSIS — F4312 Post-traumatic stress disorder, chronic: Secondary | ICD-10-CM | POA: Diagnosis not present

## 2019-07-19 DIAGNOSIS — F411 Generalized anxiety disorder: Secondary | ICD-10-CM | POA: Diagnosis not present

## 2019-07-19 DIAGNOSIS — G4700 Insomnia, unspecified: Secondary | ICD-10-CM | POA: Diagnosis not present

## 2019-07-19 DIAGNOSIS — F3181 Bipolar II disorder: Secondary | ICD-10-CM | POA: Diagnosis not present

## 2019-07-21 DIAGNOSIS — F4310 Post-traumatic stress disorder, unspecified: Secondary | ICD-10-CM | POA: Diagnosis not present

## 2019-07-21 DIAGNOSIS — F3181 Bipolar II disorder: Secondary | ICD-10-CM | POA: Diagnosis not present

## 2019-07-21 DIAGNOSIS — F900 Attention-deficit hyperactivity disorder, predominantly inattentive type: Secondary | ICD-10-CM | POA: Diagnosis not present

## 2019-08-04 DIAGNOSIS — F3181 Bipolar II disorder: Secondary | ICD-10-CM | POA: Diagnosis not present

## 2019-08-04 DIAGNOSIS — F4310 Post-traumatic stress disorder, unspecified: Secondary | ICD-10-CM | POA: Diagnosis not present

## 2019-08-04 DIAGNOSIS — F900 Attention-deficit hyperactivity disorder, predominantly inattentive type: Secondary | ICD-10-CM | POA: Diagnosis not present

## 2019-08-09 DIAGNOSIS — G4700 Insomnia, unspecified: Secondary | ICD-10-CM | POA: Diagnosis not present

## 2019-08-09 DIAGNOSIS — F3181 Bipolar II disorder: Secondary | ICD-10-CM | POA: Diagnosis not present

## 2019-08-09 DIAGNOSIS — F4312 Post-traumatic stress disorder, chronic: Secondary | ICD-10-CM | POA: Diagnosis not present

## 2019-08-09 DIAGNOSIS — F411 Generalized anxiety disorder: Secondary | ICD-10-CM | POA: Diagnosis not present

## 2019-08-11 ENCOUNTER — Ambulatory Visit
Admission: RE | Admit: 2019-08-11 | Discharge: 2019-08-11 | Disposition: A | Discharge status: Home / Self Care | Payer: BC Managed Care – PPO | Source: Ambulatory Visit | Attending: Physician Assistant | Admitting: Physician Assistant

## 2019-08-11 ENCOUNTER — Other Ambulatory Visit: Payer: Self-pay

## 2019-08-11 DIAGNOSIS — N201 Calculus of ureter: Secondary | ICD-10-CM | POA: Diagnosis not present

## 2019-08-11 DIAGNOSIS — N281 Cyst of kidney, acquired: Secondary | ICD-10-CM | POA: Diagnosis not present

## 2019-08-11 DIAGNOSIS — Z9889 Other specified postprocedural states: Secondary | ICD-10-CM | POA: Diagnosis not present

## 2019-08-14 ENCOUNTER — Emergency Department
Admission: EM | Admit: 2019-08-14 | Discharge: 2019-08-14 | Disposition: A | Discharge status: Home / Self Care | Payer: BC Managed Care – PPO | Attending: Emergency Medicine | Admitting: Emergency Medicine

## 2019-08-14 ENCOUNTER — Encounter: Payer: Self-pay | Admitting: Emergency Medicine

## 2019-08-14 ENCOUNTER — Emergency Department: Payer: BC Managed Care – PPO

## 2019-08-14 ENCOUNTER — Other Ambulatory Visit: Payer: Self-pay

## 2019-08-14 DIAGNOSIS — I10 Essential (primary) hypertension: Secondary | ICD-10-CM | POA: Insufficient documentation

## 2019-08-14 DIAGNOSIS — Z5321 Procedure and treatment not carried out due to patient leaving prior to being seen by health care provider: Secondary | ICD-10-CM | POA: Diagnosis not present

## 2019-08-14 DIAGNOSIS — R519 Headache, unspecified: Secondary | ICD-10-CM | POA: Diagnosis not present

## 2019-08-14 DIAGNOSIS — R42 Dizziness and giddiness: Secondary | ICD-10-CM | POA: Insufficient documentation

## 2019-08-14 DIAGNOSIS — H538 Other visual disturbances: Secondary | ICD-10-CM | POA: Diagnosis not present

## 2019-08-14 NOTE — ED Triage Notes (Signed)
Patient presents to the ED with dizziness, headache, hypertension and blurry vision.  Patient states symptoms began two days ago.  At this time, patient states she is wanting to leave although she is not feeling better, this RN is encouraging patient to stay for testing and to be seen.  Patient was recently started on lamotrigine.

## 2019-08-16 ENCOUNTER — Ambulatory Visit (INDEPENDENT_AMBULATORY_CARE_PROVIDER_SITE_OTHER): Payer: BC Managed Care – PPO | Admitting: Urology

## 2019-08-16 ENCOUNTER — Encounter: Payer: Self-pay | Admitting: Urology

## 2019-08-16 ENCOUNTER — Other Ambulatory Visit: Payer: Self-pay

## 2019-08-16 VITALS — BP 142/90 | HR 99 | Ht 64.0 in | Wt 174.0 lb

## 2019-08-16 DIAGNOSIS — N200 Calculus of kidney: Secondary | ICD-10-CM | POA: Diagnosis not present

## 2019-08-16 NOTE — Progress Notes (Signed)
   08/16/2019 10:58 AM   [place] Dean Onstad [birth year] [id number]  Reason for visit: Follow up nephrolithiasis  HPI: I saw Ms. Sigler in urology clinic for follow-up of nephrolithiasis.  Briefly, she is a 44 year old female with recurrent nephrolithiasis, who underwent right shockwave lithotripsy in May 2021 and had an obstructive distal fragment that ultimately required ureteroscopy on 06/29/2019.  Post procedure ultrasound showed no residual stones or hydronephrosis.  She denies any urologic complaints today.  She was supposed to complete a 24-hour urine for further evaluation of her recurrent stone disease, but has not yet completed this.  We discussed general stone prevention strategies including adequate hydration with goal of producing 2.5 L of urine daily, increasing citric acid intake, increasing calcium intake during high oxalate meals, minimizing animal protein, and decreasing salt intake. Information about dietary recommendations given today.   Stone type was calcium oxalate We will call with 24-hour urine results when completed  RTC 1 year with Benton, MD  [facility] [address], [place], [place] [address] [phone]

## 2019-08-16 NOTE — Patient Instructions (Signed)
Dietary Guidelines to Help Prevent Kidney Stones Kidney stones are deposits of minerals and salts that form inside your kidneys. Your risk of developing kidney stones may be greater depending on your diet, your lifestyle, the medicines you take, and whether you have certain medical conditions. Most people can reduce their chances of developing kidney stones by following the instructions below. Depending on your overall health and the type of kidney stones you tend to develop, your dietitian may give you more specific instructions. What are tips for following this plan? Reading food labels  Choose foods with "no salt added" or "low-salt" labels. Limit your sodium intake to less than 1500 mg per day.  Choose foods with calcium for each meal and snack. Try to eat about 300 mg of calcium at each meal. Foods that contain 200-500 mg of calcium per serving include: ? 8 oz (237 ml) of milk, fortified nondairy milk, and fortified fruit juice. ? 8 oz (237 ml) of kefir, yogurt, and soy yogurt. ? 4 oz (118 ml) of tofu. ? 1 oz of cheese. ? 1 cup (300 g) of dried figs. ? 1 cup (91 g) of cooked broccoli. ? 1-3 oz can of sardines or mackerel.  Most people need 1000 to 1500 mg of calcium each day. Talk to your dietitian about how much calcium is recommended for you. Shopping  Buy plenty of fresh fruits and vegetables. Most people do not need to avoid fruits and vegetables, even if they contain nutrients that may contribute to kidney stones.  When shopping for convenience foods, choose: ? Whole pieces of fruit. ? Premade salads with dressing on the side. ? Low-fat fruit and yogurt smoothies.  Avoid buying frozen meals or prepared deli foods.  Look for foods with live cultures, such as yogurt and kefir. Cooking  Do not add salt to food when cooking. Place a salt shaker on the table and allow each person to add his or her own salt to taste.  Use vegetable protein, such as beans, textured vegetable  protein (TVP), or tofu instead of meat in pasta, casseroles, and soups. Meal planning   Eat less salt, if told by your dietitian. To do this: ? Avoid eating processed or premade food. ? Avoid eating fast food.  Eat less animal protein, including cheese, meat, poultry, or fish, if told by your dietitian. To do this: ? Limit the number of times you have meat, poultry, fish, or cheese each week. Eat a diet free of meat at least 2 days a week. ? Eat only one serving each day of meat, poultry, fish, or seafood. ? When you prepare animal protein, cut pieces into small portion sizes. For most meat and fish, one serving is about the size of one deck of cards.  Eat at least 5 servings of fresh fruits and vegetables each day. To do this: ? Keep fruits and vegetables on hand for snacks. ? Eat 1 piece of fruit or a handful of berries with breakfast. ? Have a salad and fruit at lunch. ? Have two kinds of vegetables at dinner.  Limit foods that are high in a substance called oxalate. These include: ? Spinach. ? Rhubarb. ? Beets. ? Potato chips and french fries. ? Nuts.  If you regularly take a diuretic medicine, make sure to eat at least 1-2 fruits or vegetables high in potassium each day. These include: ? Avocado. ? Banana. ? Orange, prune, carrot, or tomato juice. ? Baked potato. ? Cabbage. ? Beans and split   peas. General instructions   Drink enough fluid to keep your urine clear or pale yellow. This is the most important thing you can do.  Talk to your health care provider and dietitian about taking daily supplements. Depending on your health and the cause of your kidney stones, you may be advised: ? Not to take supplements with vitamin C. ? To take a calcium supplement. ? To take a daily probiotic supplement. ? To take other supplements such as magnesium, fish oil, or vitamin B6.  Take all medicines and supplements as told by your health care provider.  Limit alcohol intake to no  more than 1 drink a day for nonpregnant women and 2 drinks a day for men. One drink equals 12 oz of beer, 5 oz of wine, or 1 oz of hard liquor.  Lose weight if told by your health care provider. Work with your dietitian to find strategies and an eating plan that works best for you. What foods are not recommended? Limit your intake of the following foods, or as told by your dietitian. Talk to your dietitian about specific foods you should avoid based on the type of kidney stones and your overall health. Grains Breads. Bagels. Rolls. Baked goods. Salted crackers. Cereal. Pasta. Vegetables Spinach. Rhubarb. Beets. Canned vegetables. Pickles. Olives. Meats and other protein foods Nuts. Nut butters. Large portions of meat, poultry, or fish. Salted or cured meats. Deli meats. Hot dogs. Sausages. Dairy Cheese. Beverages Regular soft drinks. Regular vegetable juice. Seasonings and other foods Seasoning blends with salt. Salad dressings. Canned soups. Soy sauce. Ketchup. Barbecue sauce. Canned pasta sauce. Casseroles. Pizza. Lasagna. Frozen meals. Potato chips. French fries. Summary  You can reduce your risk of kidney stones by making changes to your diet.  The most important thing you can do is drink enough fluid. You should drink enough fluid to keep your urine clear or pale yellow.  Ask your health care provider or dietitian how much protein from animal sources you should eat each day, and also how much salt and calcium you should have each day. This information is not intended to replace advice given to you by your health care provider. Make sure you discuss any questions you have with your health care provider. Document Revised: 04/13/2018 Document Reviewed: 12/03/2015 Elsevier Patient Education  2020 [employer]  [facility] Instructions [facility]  You will receive a box/kit in the mail that will have a urine jug and instructions in the kit.  When the box arrives  you will need to call our office [phone] to schedule a LAB appointment.  You will need to do a 24hour urine and this should be done during the days that our office will be open.  For example any day from Sunday through Thursday.  If you take Vitamin C 100mg or greater please stop this 5 days prior to collection.  How to collect the urine sample: On the day you start the urine sample this 1st morning urine should NOT be collected.  For the rest of the day including all night urines should be collected.  On the next morning the 1st urine should be collected and then you will be finished with the urine collections.  You will need to bring the box with you on your LAB appointment day after urine has been collected and all instructions are complete in the box.  Your blood will be drawn and the box will be collected by our Lab employee to be sent off   for analysis.  When urine and blood is complete you will need to schedule a follow up appointment for lab results.   

## 2019-08-18 DIAGNOSIS — F4310 Post-traumatic stress disorder, unspecified: Secondary | ICD-10-CM | POA: Diagnosis not present

## 2019-08-18 DIAGNOSIS — F3181 Bipolar II disorder: Secondary | ICD-10-CM | POA: Diagnosis not present

## 2019-08-18 DIAGNOSIS — F900 Attention-deficit hyperactivity disorder, predominantly inattentive type: Secondary | ICD-10-CM | POA: Diagnosis not present

## 2019-08-21 ENCOUNTER — Other Ambulatory Visit: Payer: BC Managed Care – PPO

## 2019-08-21 ENCOUNTER — Other Ambulatory Visit: Payer: Self-pay

## 2019-08-21 DIAGNOSIS — N200 Calculus of kidney: Secondary | ICD-10-CM | POA: Diagnosis not present

## 2019-08-25 ENCOUNTER — Other Ambulatory Visit: Payer: Self-pay | Admitting: Urology

## 2019-08-28 DIAGNOSIS — R030 Elevated blood-pressure reading, without diagnosis of hypertension: Secondary | ICD-10-CM | POA: Diagnosis not present

## 2019-08-28 DIAGNOSIS — E11649 Type 2 diabetes mellitus with hypoglycemia without coma: Secondary | ICD-10-CM | POA: Diagnosis not present

## 2019-08-28 DIAGNOSIS — K625 Hemorrhage of anus and rectum: Secondary | ICD-10-CM | POA: Diagnosis not present

## 2019-08-30 ENCOUNTER — Telehealth: Payer: Self-pay

## 2019-08-30 NOTE — Telephone Encounter (Signed)
24-hour urine showed extremely low urine volume is the primary cause of her kidney stones.  Need to increase fluid intake with goal of 2.5 L of urine output per day.  Would also encourage her to add lemon juice or grapefruit juice, or Crystal light lemonade to the water to increase citrate which can also prevent stones.  If she does not increase the fluid in her diet she will continue to form kidney stones.  Follow-up as scheduled  Nickolas Madrid, MD 08/30/2019   Called pt informed her of the information above. Pt gave verbal understanding.

## 2019-08-30 NOTE — Telephone Encounter (Signed)
-----   Message from Billey Co, MD sent at 08/30/2019  2:05 PM EDT -----   ----- Message ----- From: Benard Halsted Sent: 08/25/2019   2:21 PM EDT To: Billey Co, MD

## 2019-08-30 NOTE — Progress Notes (Signed)
24-hour urine showed extremely low urine volume is the primary cause of her kidney stones.  Need to increase fluid intake with goal of 2.5 L of urine output per day.  Would also encourage her to add lemon juice or grapefruit juice, or Crystal light lemonade to the water to increase citrate which can also prevent stones.  If she does not increase the fluid in her diet she will continue to form kidney stones.  Follow-up as scheduled  Nickolas Madrid, MD 08/30/2019

## 2019-09-07 ENCOUNTER — Encounter: Payer: Self-pay | Admitting: Certified Nurse Midwife

## 2019-09-07 ENCOUNTER — Other Ambulatory Visit (HOSPITAL_COMMUNITY)
Admission: RE | Admit: 2019-09-07 | Discharge: 2019-09-07 | Disposition: A | Discharge status: Home / Self Care | Payer: BC Managed Care – PPO | Source: Ambulatory Visit | Attending: Certified Nurse Midwife | Admitting: Certified Nurse Midwife

## 2019-09-07 ENCOUNTER — Other Ambulatory Visit: Payer: Self-pay

## 2019-09-07 ENCOUNTER — Ambulatory Visit (INDEPENDENT_AMBULATORY_CARE_PROVIDER_SITE_OTHER): Payer: BC Managed Care – PPO | Admitting: Certified Nurse Midwife

## 2019-09-07 VITALS — BP 114/83 | HR 100 | Ht 64.0 in | Wt 175.9 lb

## 2019-09-07 DIAGNOSIS — Z1231 Encounter for screening mammogram for malignant neoplasm of breast: Secondary | ICD-10-CM | POA: Diagnosis not present

## 2019-09-07 DIAGNOSIS — Z113 Encounter for screening for infections with a predominantly sexual mode of transmission: Secondary | ICD-10-CM

## 2019-09-07 DIAGNOSIS — N644 Mastodynia: Secondary | ICD-10-CM

## 2019-09-07 DIAGNOSIS — Z975 Presence of (intrauterine) contraceptive device: Secondary | ICD-10-CM

## 2019-09-07 DIAGNOSIS — Z01419 Encounter for gynecological examination (general) (routine) without abnormal findings: Secondary | ICD-10-CM | POA: Insufficient documentation

## 2019-09-07 NOTE — Progress Notes (Signed)
ANNUAL PREVENTATIVE CARE GYN  ENCOUNTER NOTE  Subjective:       Rose [name] is a 44 y.o. G45P1011 female here for a routine annual gynecologic exam.  Current complaints: 1. Desires STI testing-just ended relationship 2. Needs diagnostic mammogram ordered due to history of previous abnormal study and left breast pain  Denies difficulty breathing or respiratory distress, chest pain, excessive vaginal bleeding, dysuria, and leg pain or swelling.    Gynecologic History  No LMP recorded. (Menstrual status: IUD).  Contraception: IUD, Mirena  Last Pap: 06/2018. Results were: Neg/Neg  Last mammogram: 05/2017. Results were: BI-RADS 2  Obstetric History  OB History  Gravida Para Term Preterm AB Living  2 1 1   1 1   SAB TAB Ectopic Multiple Live Births  1       1    # Outcome Date GA Lbr Len/2nd Weight Sex Delivery Anes PTL Lv  2 SAB 2008          1 Term 1993    F CS-Unspec  N LIV    Past Medical History:  Diagnosis Date  . ADHD   . Anxiety   . Diabetes mellitus without complication ([place])   . Family history of adverse reaction to anesthesia    SISTER-TOO MUCH ANESTHESIA FOR C-SECTION  . GERD (gastroesophageal reflux disease)   . History of kidney stones   . Neck pain   . Right leg pain     Past Surgical History:  Procedure Laterality Date  . CESAREAN SECTION    . CHOLECYSTECTOMY    . CYSTOSCOPY W/ RETROGRADES  06/29/2019   Procedure: CYSTOSCOPY WITH RETROGRADE PYELOGRAM;  Surgeon: Billey Co, MD;  Location: [facility];  Service: Urology;;  . Consuela Mimes WITH RETROGRADE PYELOGRAM, URETEROSCOPY AND STENT PLACEMENT Left 01/17/2019   Procedure: CYSTOSCOPY WITH RETROGRADE PYELOGRAM, URETEROSCOPY AND STENT PLACEMENT;  Surgeon: Abbie Sons, MD;  Location: [facility];  Service: Urology;  Laterality: Left;  . CYSTOSCOPY/URETEROSCOPY/HOLMIUM LASER/STENT PLACEMENT Right 06/29/2019   Procedure: CYSTOSCOPY/URETEROSCOPY/HOLMIUM LASER/STENT PLACEMENT;  Surgeon: Billey Co,  MD;  Location: [facility];  Service: Urology;  Laterality: Right;  . EXTRACORPOREAL SHOCK WAVE LITHOTRIPSY Right 06/01/2019   Procedure: EXTRACORPOREAL SHOCK WAVE LITHOTRIPSY (ESWL);  Surgeon: Billey Co, MD;  Location: [facility];  Service: Urology;  Laterality: Right;  . kidney stone removal  01/21, 05/21, 06/21  . LAPAROSCOPY N/A 08/24/2013   Procedure: LAPAROSCOPY OPERATIVE with lysis of adhesions ;  Surgeon: Marylynn Pearson, MD;  Location: [facility];  Service: Gynecology;  Laterality: N/A;  . STONE EXTRACTION WITH BASKET Left 01/17/2019   Procedure: STONE EXTRACTION WITH BASKET;  Surgeon: Abbie Sons, MD;  Location: [facility];  Service: Urology;  Laterality: Left;  . wrist, cyst removal      Current Outpatient Medications on File Prior to Visit  Medication Sig Dispense Refill  . Amphetamine ER (ADZENYS XR-ODT) 18.8 MG TBED Take 18.8 mg by mouth daily.     . Ascorbic Acid (VITAMIN C) 1000 MG tablet Take 2,000 mg by mouth daily.    . busPIRone (BUSPAR) 30 MG tablet Take 30 mg by mouth 2 (two) times daily.    . Carboxymethylcellul-Glycerin (LUBRICATING EYE DROPS OP) Place 1 drop into both eyes daily as needed (dry eyes).    . clonazePAM (KLONOPIN) 0.5 MG tablet Take 0.5 mg by mouth daily as needed.    . clonazePAM (KLONOPIN) 1 MG tablet Take 1 mg by mouth at bedtime as needed for anxiety.     Marland Kitchen  glucose blood (ONE TOUCH ULTRA TEST) test strip 1 each by Other route daily. And lancets 1/day 250.00 100 each 12  . insulin detemir (LEVEMIR) 100 UNIT/ML injection Inject 40 Units into the skin every morning.     Marland Kitchen levonorgestrel (MIRENA) 20 MCG/24HR IUD 1 each by Intrauterine route once.    . metFORMIN (GLUCOPHAGE) 1000 MG tablet Take 1,000 mg by mouth 2 (two) times daily.     Marland Kitchen omeprazole (PRILOSEC) 20 MG capsule Take 20 mg by mouth daily as needed (reflux).     . QUEtiapine (SEROQUEL) 50 MG tablet Take 75 mg by mouth at bedtime.    . simvastatin (ZOCOR) 20 MG tablet Take 20 mg by mouth daily at  6 PM.     . lamoTRIgine (LAMICTAL) 25 MG tablet Take 25-100 mg by mouth See admin instructions. Take 25 mg for 14 days  Tale 50 mg for 14 days Return to doctor/ will be taking 100 mg daily (Patient not taking: Reported on 09/07/2019)     No current facility-administered medications on file prior to visit.    Allergies  Allergen Reactions  . Bactrim [Sulfamethoxazole-Trimethoprim] Swelling  . Neurontin [Gabapentin] Itching    Social History   Socioeconomic History  . Marital status: Single    Spouse name: Not on file  . Number of children: 1  . Years of education: GED  . Highest education level: Not on file  Occupational History    Employer: [employer]  Tobacco Use  . Smoking status: Former Smoker    Years: 1.00    Types: Cigarettes  . Smokeless tobacco: Never Used  Vaping Use  . Vaping Use: Some days  . Substances: CBD  Substance and Sexual Activity  . Alcohol use: Not Currently    Alcohol/week: 1.0 standard drink    Types: 1 Standard drinks or equivalent per week    Comment: WINE OCC  . Drug use: Not Currently    Types: Marijuana    Comment: last use 05/29/19  . Sexual activity: Not Currently    Birth control/protection: I.U.D.  Other Topics Concern  . Not on file  Social History Narrative   Patient is single and she takes care of her daughter. Patient works full time Web designer.   Education GED.   Right handed.   Caffeine one cup of coffee daily and soda daily.   Children One       Social Determinants of Health   Financial Resource Strain:   . Difficulty of Paying Living Expenses: Not on file  Food Insecurity:   . Worried About Charity fundraiser in the Last Year: Not on file  . Ran Out of Food in the Last Year: Not on file  Transportation Needs:   . Lack of Transportation (Medical): Not on file  . Lack of Transportation (Non-Medical): Not on file  Physical Activity:   . Days of Exercise per Week: Not on file  . Minutes of Exercise per  Session: Not on file  Stress:   . Feeling of Stress : Not on file  Social Connections:   . Frequency of Communication with Friends and Family: Not on file  . Frequency of Social Gatherings with Friends and Family: Not on file  . Attends Religious Services: Not on file  . Active Member of Clubs or Organizations: Not on file  . Attends Archivist Meetings: Not on file  . Marital Status: Not on file  Intimate Partner Violence:   .  Fear of Current or Ex-Partner: Not on file  . Emotionally Abused: Not on file  . Physically Abused: Not on file  . Sexually Abused: Not on file    Family History  Problem Relation Age of Onset  . Aneurysm Mother   . Blindness Father   . High blood pressure Father   . Hypertension Sister   . Hypertension Sister   . Diabetes Neg Hx     The following portions of the patient's history were reviewed and updated as appropriate: allergies, current medications, past family history, past medical history, past social history, past surgical history and problem list.  Review of Systems  ROS negative except as noted above. Information obtained from patient.    Objective:   BP 114/83   Pulse 100   Ht 5\' 4"  (1.626 m)   Wt 175 lb 14.4 oz (79.8 kg)   BMI 30.19 kg/m    CONSTITUTIONAL: Well-developed, well-nourished female in no acute distress.   PSYCHIATRIC: Normal mood and affect. Normal behavior. Normal judgment and thought content.  [place]: Alert and oriented to person, place, and time. Normal muscle tone coordination. No cranial nerve deficit noted.  HENT:  Normocephalic, atraumatic, External right and left ear normal.   EYES: Conjunctivae and EOM are normal. Pupils are equal and round.   NECK: Normal range of motion, supple, no masses.  Normal thyroid.   SKIN: Skin is warm and dry. No rash noted. Not diaphoretic. No erythema. No pallor. Professional tattoos present.   CARDIOVASCULAR: Normal heart rate noted, regular rhythm, no  murmur.  RESPIRATORY: Clear to auscultation bilaterally. Effort and breath sounds normal, no problems with respiration noted.  BREASTS: Symmetric in size. No masses, skin changes, nipple drainage, or lymphadenopathy.  ABDOMEN: Soft, normal bowel sounds, no distention noted.  No tenderness, rebound or guarding.   PELVIC:  External Genitalia: Normal  BUS: Normal  Vagina: Normal except for thin white discharge present, swab collected  Cervix: Normal, IUD strings present at os  Uterus: Normal  Adnexa: Normal   MUSCULOSKELETAL: Normal range of motion. No tenderness.  No cyanosis, clubbing, or edema.  2+ distal pulses.  LYMPHATIC: No Axillary, Supraclavicular, or Inguinal Adenopathy.  Assessment:   Annual gynecologic examination 44 y.o.   Contraception: IUD, Mirena   Obesity 1   Problem List Items Addressed This Visit      Other   IUD (intrauterine device) in place    Other Visit Diagnoses    Well woman exam    -  Primary   Screening mammogram, encounter for          Plan:   Pap: Not needed   Mammogram: Ordered  Labs: See orders  Routine preventative health maintenance measures emphasized: Exercise/Diet/Weight control, Tobacco Warnings, Alcohol/Substance use risks, Stress Management and Safe Sex; see AVS  Reviewed red flag symptoms and when to call  Return to [place] for [employer] or sooner if needed   Dani Gobble, CNM  [facility], [facility] 09/07/19 8:23 AM

## 2019-09-07 NOTE — Patient Instructions (Signed)

## 2019-09-12 LAB — CERVICOVAGINAL ANCILLARY ONLY
Bacterial Vaginitis (gardnerella): POSITIVE — AB
Candida Glabrata: NEGATIVE
Candida Vaginitis: NEGATIVE
Chlamydia: NEGATIVE
Comment: NEGATIVE
Comment: NEGATIVE
Comment: NEGATIVE
Comment: NEGATIVE
Comment: NEGATIVE
Comment: NORMAL
Neisseria Gonorrhea: NEGATIVE
Trichomonas: NEGATIVE

## 2019-09-13 ENCOUNTER — Telehealth: Payer: Self-pay | Admitting: Certified Nurse Midwife

## 2019-09-13 ENCOUNTER — Other Ambulatory Visit: Payer: Self-pay

## 2019-09-13 MED ORDER — METRONIDAZOLE 500 MG PO TABS
500.0000 mg | ORAL_TABLET | Freq: Two times a day (BID) | ORAL | 0 refills | Status: DC
Start: 2019-09-13 — End: 2019-11-09

## 2019-09-13 NOTE — Telephone Encounter (Signed)
Called and informed pt that Flagyl was sent to [place] on [address] per Dani Gobble, CNM. Informed pt to take BID x 7 days. Pt verbalized understanding.

## 2019-09-13 NOTE — Telephone Encounter (Signed)
Patient called in and stated that her provider had given her options to take one of three prescriptions however she doesn't know how to work Pharmacist, community - but she would like the prescription that starts with an "L" called into the [facility] on [address] in [place]. Patient stated that was the only prescription that was suggested for her in pill form. Informed patient that I would send this message over to her provider and to allow 24 -48 hours for a response. Patient verbalized understanding. Could you please advise?

## 2019-09-13 NOTE — Telephone Encounter (Signed)
Please send Flagyl 500 mg BID x 7 days, 14 tablets, no refills. Thanks, Dani Gobble, CNM

## 2019-09-20 DIAGNOSIS — F4312 Post-traumatic stress disorder, chronic: Secondary | ICD-10-CM | POA: Diagnosis not present

## 2019-09-20 DIAGNOSIS — G4700 Insomnia, unspecified: Secondary | ICD-10-CM | POA: Diagnosis not present

## 2019-09-20 DIAGNOSIS — F411 Generalized anxiety disorder: Secondary | ICD-10-CM | POA: Diagnosis not present

## 2019-09-20 DIAGNOSIS — F3181 Bipolar II disorder: Secondary | ICD-10-CM | POA: Diagnosis not present

## 2019-09-22 DIAGNOSIS — F3181 Bipolar II disorder: Secondary | ICD-10-CM | POA: Diagnosis not present

## 2019-09-22 DIAGNOSIS — F4310 Post-traumatic stress disorder, unspecified: Secondary | ICD-10-CM | POA: Diagnosis not present

## 2019-09-22 DIAGNOSIS — F900 Attention-deficit hyperactivity disorder, predominantly inattentive type: Secondary | ICD-10-CM | POA: Diagnosis not present

## 2019-10-06 DIAGNOSIS — F900 Attention-deficit hyperactivity disorder, predominantly inattentive type: Secondary | ICD-10-CM | POA: Diagnosis not present

## 2019-10-06 DIAGNOSIS — F4310 Post-traumatic stress disorder, unspecified: Secondary | ICD-10-CM | POA: Diagnosis not present

## 2019-10-06 DIAGNOSIS — F3181 Bipolar II disorder: Secondary | ICD-10-CM | POA: Diagnosis not present

## 2019-10-10 DIAGNOSIS — Z021 Encounter for pre-employment examination: Secondary | ICD-10-CM | POA: Diagnosis not present

## 2019-10-10 DIAGNOSIS — Z1329 Encounter for screening for other suspected endocrine disorder: Secondary | ICD-10-CM | POA: Diagnosis not present

## 2019-10-18 DIAGNOSIS — F3181 Bipolar II disorder: Secondary | ICD-10-CM | POA: Diagnosis not present

## 2019-10-18 DIAGNOSIS — F4312 Post-traumatic stress disorder, chronic: Secondary | ICD-10-CM | POA: Diagnosis not present

## 2019-10-18 DIAGNOSIS — F411 Generalized anxiety disorder: Secondary | ICD-10-CM | POA: Diagnosis not present

## 2019-10-18 DIAGNOSIS — G4700 Insomnia, unspecified: Secondary | ICD-10-CM | POA: Diagnosis not present

## 2019-10-24 DIAGNOSIS — F4310 Post-traumatic stress disorder, unspecified: Secondary | ICD-10-CM | POA: Diagnosis not present

## 2019-10-24 DIAGNOSIS — F900 Attention-deficit hyperactivity disorder, predominantly inattentive type: Secondary | ICD-10-CM | POA: Diagnosis not present

## 2019-10-24 DIAGNOSIS — F3181 Bipolar II disorder: Secondary | ICD-10-CM | POA: Diagnosis not present

## 2019-11-09 ENCOUNTER — Ambulatory Visit
Admission: RE | Admit: 2019-11-09 | Discharge: 2019-11-09 | Disposition: A | Discharge status: Home / Self Care | Payer: BC Managed Care – PPO | Source: Ambulatory Visit | Attending: Urology | Admitting: Urology

## 2019-11-09 ENCOUNTER — Ambulatory Visit (INDEPENDENT_AMBULATORY_CARE_PROVIDER_SITE_OTHER): Payer: BC Managed Care – PPO | Admitting: Urology

## 2019-11-09 ENCOUNTER — Encounter: Payer: Self-pay | Admitting: Urology

## 2019-11-09 ENCOUNTER — Other Ambulatory Visit: Payer: Self-pay

## 2019-11-09 ENCOUNTER — Ambulatory Visit
Admission: RE | Admit: 2019-11-09 | Discharge: 2019-11-09 | Disposition: A | Discharge status: Home / Self Care | Payer: BC Managed Care – PPO | Attending: Urology | Admitting: Urology

## 2019-11-09 VITALS — BP 145/95 | HR 118 | Ht 64.0 in | Wt 178.1 lb

## 2019-11-09 DIAGNOSIS — I878 Other specified disorders of veins: Secondary | ICD-10-CM | POA: Diagnosis not present

## 2019-11-09 DIAGNOSIS — N200 Calculus of kidney: Secondary | ICD-10-CM

## 2019-11-09 DIAGNOSIS — Z87442 Personal history of urinary calculi: Secondary | ICD-10-CM | POA: Diagnosis not present

## 2019-11-09 DIAGNOSIS — N201 Calculus of ureter: Secondary | ICD-10-CM | POA: Diagnosis not present

## 2019-11-09 DIAGNOSIS — R109 Unspecified abdominal pain: Secondary | ICD-10-CM | POA: Diagnosis not present

## 2019-11-09 DIAGNOSIS — R102 Pelvic and perineal pain: Secondary | ICD-10-CM

## 2019-11-09 NOTE — Patient Instructions (Signed)
Pelvic Pain, Female Pelvic pain is pain in your lower abdomen, below your belly button and between your hips. The pain may start suddenly (be acute), keep coming back (be recurring), or last a long time (become chronic). Pelvic pain that lasts longer than 6 months is considered chronic. Pelvic pain may affect your:  Reproductive organs.  Urinary system.  Digestive tract.  Musculoskeletal system. There are many potential causes of pelvic pain. Sometimes, the pain can be a result of digestive or urinary conditions, strained muscles or ligaments, or reproductive conditions. Sometimes the cause of pelvic pain is not known. Follow these instructions at home:   Take over-the-counter and prescription medicines only as told by your health care provider.  Rest as told by your health care provider.  Do not have sex if it hurts.  Keep a journal of your pelvic pain. Write down: ? When the pain started. ? Where the pain is located. ? What seems to make the pain better or worse, such as food or your period (menstrual cycle). ? Any symptoms you have along with the pain.  Keep all follow-up visits as told by your health care provider. This is important. Contact a health care provider if:  Medicine does not help your pain.  Your pain comes back.  You have new symptoms.  You have abnormal vaginal discharge or bleeding, including bleeding after menopause.  You have a fever or chills.  You are constipated.  You have blood in your urine or stool.  You have foul-smelling urine.  You feel weak or light-headed. Get help right away if:  You have sudden severe pain.  Your pain gets steadily worse.  You have severe pain along with fever, nausea, vomiting, or excessive sweating.  You lose consciousness. Summary  Pelvic pain is pain in your lower abdomen, below your belly button and between your hips.  There are many potential causes of pelvic pain.  Keep a journal of your pelvic  pain. This information is not intended to replace advice given to you by your health care provider. Make sure you discuss any questions you have with your health care provider. Document Revised: 06/09/2017 Document Reviewed: 06/09/2017 Elsevier Patient Education  [place].   Overactive Bladder, Adult  Overactive bladder refers to a condition in which a person has a sudden need to pass urine. The person may leak urine if he or she cannot get to the bathroom fast enough (urinary incontinence). A person with this condition may also wake up several times in the night to go to the bathroom. Overactive bladder is associated with poor nerve signals between your bladder and your brain. Your bladder may get the signal to empty before it is full. You may also have very sensitive muscles that make your bladder squeeze too soon. These symptoms might interfere with daily work or social activities. What are the causes? This condition may be associated with or caused by:  Urinary tract infection.  Infection of nearby tissues, such as the prostate.  Prostate enlargement.  Surgery on the uterus or urethra.  Bladder stones, inflammation, or tumors.  Drinking too much caffeine or alcohol.  Certain medicines, especially medicines that get rid of extra fluid in the body (diuretics).  Muscle or nerve weakness, especially from: ? A spinal cord injury. ? Stroke. ? Multiple sclerosis. ? Parkinson's disease.  Diabetes.  Constipation. What increases the risk? You may be at greater risk for overactive bladder if you:  Are an older adult.  Smoke.  Are going through menopause.  Have prostate problems.  Have a neurological disease, such as stroke, dementia, Parkinson's disease, or multiple sclerosis (MS).  Eat or drink things that irritate the bladder. These include alcohol, spicy food, and caffeine.  Are overweight or obese. What are the signs or symptoms? Symptoms of this condition  include:  Sudden, strong urge to urinate.  Leaking urine.  Urinating 8 or more times a day.  Waking up to urinate 2 or more times a night. How is this diagnosed? Your health care provider may suspect overactive bladder based on your symptoms. He or she will diagnose this condition by:  A physical exam and medical history.  Blood or urine tests. You might need bladder or urine tests to help determine what is causing your overactive bladder. You might also need to see a health care provider who specializes in urinary tract problems (urologist). How is this treated? Treatment for overactive bladder depends on the cause of your condition and whether it is mild or severe. You can also make lifestyle changes at home. Options include:  Bladder training. This may include: ? Learning to control the urge to urinate by following a schedule that directs you to urinate at regular intervals (timed voiding). ? Doing Kegel exercises to strengthen your pelvic floor muscles, which support your bladder. Toning these muscles can help you control urination, even if your bladder muscles are overactive.  Special devices. This may include: ? Biofeedback, which uses sensors to help you become aware of your body's signals. ? Electrical stimulation, which uses electrodes placed inside the body (implanted) or outside the body. These electrodes send gentle pulses of electricity to strengthen the nerves or muscles that control the bladder. ? Women may use a plastic device that fits into the vagina and supports the bladder (pessary).  Medicines. ? Antibiotics to treat bladder infection. ? Antispasmodics to stop the bladder from releasing urine at the wrong time. ? Tricyclic antidepressants to relax bladder muscles. ? Injections of botulinum toxin type A directly into the bladder tissue to relax bladder muscles.  Lifestyle changes. This may include: ? Weight loss. Talk to your health care provider about weight  loss methods that would work best for you. ? Diet changes. This may include reducing how much alcohol and caffeine you consume, or drinking fluids at different times of the day. ? Not smoking. Do not use any products that contain nicotine or tobacco, such as cigarettes and e-cigarettes. If you need help quitting, ask your health care provider.  Surgery. ? A device may be implanted to help manage the nerve signals that control urination. ? An electrode may be implanted to stimulate electrical signals in the bladder. ? A procedure may be done to change the shape of the bladder. This is done only in very severe cases. Follow these instructions at home: Lifestyle  Make any diet or lifestyle changes that are recommended by your health care provider. These may include: ? Drinking less fluid or drinking fluids at different times of the day. ? Cutting down on caffeine or alcohol. ? Doing Kegel exercises. ? Losing weight if needed. ? Eating a healthy and balanced diet to prevent constipation. This may include:  Eating foods that are high in fiber, such as fresh fruits and vegetables, whole grains, and beans.  Limiting foods that are high in fat and processed sugars, such as fried and sweet foods. General instructions  Take over-the-counter and prescription medicines only as told by your health care provider.  If you were prescribed an antibiotic medicine, take it as told by your health care provider. Do not stop taking the antibiotic even if you start to feel better.  Use any implants or pessary as told by your health care provider.  If needed, wear pads to absorb urine leakage.  Keep a journal or log to track how much and when you drink and when you feel the need to urinate. This will help your health care provider monitor your condition.  Keep all follow-up visits as told by your health care provider. This is important. Contact a health care provider if:  You have a fever.  Your  symptoms do not get better with treatment.  Your pain and discomfort get worse.  You have more frequent urges to urinate. Get help right away if:  You are not able to control your bladder. Summary  Overactive bladder refers to a condition in which a person has a sudden need to pass urine.  Several conditions may lead to an overactive bladder.  Treatment for overactive bladder depends on the cause and severity of your condition.  Follow your health care provider's instructions about lifestyle changes, doing Kegel exercises, keeping a journal, and taking medicines. This information is not intended to replace advice given to you by your health care provider. Make sure you discuss any questions you have with your health care provider. Document Revised: 04/14/2018 Document Reviewed: 01/07/2017 Elsevier Patient Education  [place].

## 2019-11-09 NOTE — Progress Notes (Signed)
   11/09/2019 2:07 PM   [place] Dean Recht 1975-10-25 [id number]  Reason for visit: Bilateral flank and groin pain, urinary symptoms  HPI: I saw Ms. Gift in urology clinic today for evaluation of 1 to 2 weeks of bilateral flank and groin pain as well as urinary symptoms including urgency, frequency, dysuria, and pelvic pain, as well as nausea.  She is a 44 year old female with an extensive history of kidney stones requiring left ureteroscopy with Dr. Bernardo Heater in January 2021, shockwave lithotripsy on the right side with me in May 2021, followed by right ureteroscopy with me in June 2021 for a residual distal ureteral fragment.  Follow-up imaging on 08/11/2019 with a renal ultrasound showed no hydronephrosis or nephrolithiasis.  I personally reviewed the renal ultrasound.  Her urinalysis is completely benign today with 0-5 WBCs, 0-2 RBCs, no bacteria, nitrite negative, no leukocytes.  I personally reviewed her KUB today which shows a number of phleboliths that are unchanged from her prior KUB, but no evidence of calcifications overlying the ureters or kidneys.  I was very frank with the patient today that I am not sure I have a good explanation for her constellation of symptoms, as her urinalysis is completely benign, and I do not appreciate any stones on KUB today.  Also reassuring is her recent renal ultrasound and August 2021 that showed no hydronephrosis or stones.  I recommended further evaluation with a repeat renal ultrasound, and potentially consideration of a CT in the future if she has persistent pain.  -Renal/bladder ultrasound, call with results -Strongly recommend consulting with PCP or GYN for evaluation of other etiologies of her pain in the setting of completely normal urinalysis and KUB today  Billey Co, [place] [address], [place], [place] [address] [phone]

## 2019-11-10 LAB — URINALYSIS, COMPLETE
Bilirubin, UA: NEGATIVE
Glucose, UA: NEGATIVE
Ketones, UA: NEGATIVE
Leukocytes,UA: NEGATIVE
Nitrite, UA: NEGATIVE
Protein,UA: NEGATIVE
RBC, UA: NEGATIVE
Specific Gravity, UA: 1.015 (ref 1.005–1.030)
Urobilinogen, Ur: 0.2 mg/dL (ref 0.2–1.0)
pH, UA: 6 (ref 5.0–7.5)

## 2019-11-10 LAB — MICROSCOPIC EXAMINATION: Bacteria, UA: NONE SEEN

## 2019-11-12 LAB — CULTURE, URINE COMPREHENSIVE

## 2019-11-13 ENCOUNTER — Telehealth: Payer: Self-pay

## 2019-11-13 LAB — CULTURE, URINE COMPREHENSIVE

## 2019-11-13 NOTE — Telephone Encounter (Signed)
Called pt no answer left detailed message for pt informing her of the information below. Advised pt to call back for questions or concerns.

## 2019-11-13 NOTE — Telephone Encounter (Signed)
-----   Message from Billey Co, MD sent at 11/13/2019  8:38 AM EST ----- No infection on urine culture, please encourage her to schedule renal/bladder US like we discussed in clinic to try and figure out why shes having so much pain. Thanks  Nickolas Madrid, MD 11/13/2019

## 2019-11-14 DIAGNOSIS — F900 Attention-deficit hyperactivity disorder, predominantly inattentive type: Secondary | ICD-10-CM | POA: Diagnosis not present

## 2019-11-14 DIAGNOSIS — F3181 Bipolar II disorder: Secondary | ICD-10-CM | POA: Diagnosis not present

## 2019-11-14 DIAGNOSIS — F4310 Post-traumatic stress disorder, unspecified: Secondary | ICD-10-CM | POA: Diagnosis not present

## 2019-11-15 ENCOUNTER — Ambulatory Visit
Admission: RE | Admit: 2019-11-15 | Discharge: 2019-11-15 | Disposition: A | Discharge status: Home / Self Care | Payer: BC Managed Care – PPO | Source: Ambulatory Visit | Attending: Urology | Admitting: Urology

## 2019-11-15 ENCOUNTER — Other Ambulatory Visit: Payer: Self-pay

## 2019-11-15 DIAGNOSIS — N281 Cyst of kidney, acquired: Secondary | ICD-10-CM | POA: Diagnosis not present

## 2019-11-15 DIAGNOSIS — Z87442 Personal history of urinary calculi: Secondary | ICD-10-CM | POA: Diagnosis not present

## 2019-11-15 NOTE — Progress Notes (Signed)
11/16/2019 3:06 PM   Rose [name] 1975-12-18 [id number]  Referring provider: Department, [facility] [place],  [place] [id number]  Chief Complaint  Patient presents with  . Nephrolithiasis    HPI: Rose Dean Vandenboom is a 44 y.o. female with PHM of nephrolithiasis with bilateral flank pain and bilateral groin pain who presents today to discuss her RUS results.   At her office visit with Dr. Diamantina Providence on 11/09/2019, she was experiencing bilateral flank pain and bilateral groin pain associated with urgency, frequency, dysuria, pelvic pain and nausea. Her UA was benign and her KUB negative for stones. Renal ultrasound was ordered for further evaluation.  RUS completed on November 15, 2019 noted no hydronephrosis or stones, but a slight interval increase in the complex cyst in the inferior pole of the left kidney that measured 1.3 x 1.3 x 1.6 cm in February of this year and is now 1.4 x 1.6 x 1.6 cm on this exam.  She continues to experience frequency, urgency, weak urinary stream, bilateral flank pain, bilateral groin pain and nausea.  Patient denies any modifying or aggravating factors.  Patient denies any gross hematuria, dysuria or suprapubic/flank pain.  Patient denies any fevers, chills, nausea or vomiting.    PMH: Past Medical History:  Diagnosis Date  . ADHD   . Anxiety   . Diabetes mellitus without complication ([place])   . Family history of adverse reaction to anesthesia    SISTER-TOO MUCH ANESTHESIA FOR C-SECTION  . GERD (gastroesophageal reflux disease)   . History of kidney stones   . Neck pain   . Right leg pain     Surgical History: Past Surgical History:  Procedure Laterality Date  . CESAREAN SECTION    . CHOLECYSTECTOMY    . CYSTOSCOPY W/ RETROGRADES  06/29/2019   Procedure: CYSTOSCOPY WITH RETROGRADE PYELOGRAM;  Surgeon: Billey Co, MD;  Location: [facility];  Service: Urology;;  . Consuela Mimes WITH RETROGRADE  PYELOGRAM, URETEROSCOPY AND STENT PLACEMENT Left 01/17/2019   Procedure: CYSTOSCOPY WITH RETROGRADE PYELOGRAM, URETEROSCOPY AND STENT PLACEMENT;  Surgeon: Abbie Sons, MD;  Location: [facility];  Service: Urology;  Laterality: Left;  . CYSTOSCOPY/URETEROSCOPY/HOLMIUM LASER/STENT PLACEMENT Right 06/29/2019   Procedure: CYSTOSCOPY/URETEROSCOPY/HOLMIUM LASER/STENT PLACEMENT;  Surgeon: Billey Co, MD;  Location: [facility];  Service: Urology;  Laterality: Right;  . EXTRACORPOREAL SHOCK WAVE LITHOTRIPSY Right 06/01/2019   Procedure: EXTRACORPOREAL SHOCK WAVE LITHOTRIPSY (ESWL);  Surgeon: Billey Co, MD;  Location: [facility];  Service: Urology;  Laterality: Right;  . kidney stone removal  01/21, 05/21, 06/21  . LAPAROSCOPY N/A 08/24/2013   Procedure: LAPAROSCOPY OPERATIVE with lysis of adhesions ;  Surgeon: Marylynn Pearson, MD;  Location: [facility];  Service: Gynecology;  Laterality: N/A;  . STONE EXTRACTION WITH BASKET Left 01/17/2019   Procedure: STONE EXTRACTION WITH BASKET;  Surgeon: Abbie Sons, MD;  Location: [facility];  Service: Urology;  Laterality: Left;  . wrist, cyst removal      Home Medications:  Allergies as of 11/16/2019      Reactions   Bactrim [sulfamethoxazole-trimethoprim] Swelling   Neurontin [gabapentin] Itching      Medication List       Accurate as of November 16, 2019 11:59 PM. If you have any questions, ask your nurse or doctor.        Adzenys XR-ODT 18.8 MG Tbed Generic drug: Amphetamine ER Take 18.8 mg by mouth daily.   busPIRone 30 MG tablet Commonly  known as: BUSPAR Take 30 mg by mouth 2 (two) times daily.   clonazePAM 0.25 MG disintegrating tablet Commonly known as: KLONOPIN Take 0.25 mg by mouth 2 (two) times daily. What changed: Another medication with the same name was removed. Continue taking this medication, and follow the directions you see here. Changed by: Zara Council, PA-C   EPINEPHrine 0.3 mg/0.3 mL Soaj injection Commonly known  as: EPI-PEN Inject into the muscle.   glucose blood test strip Commonly known as: ONE TOUCH ULTRA TEST 1 each by Other route daily. And lancets 1/day 250.00   insulin detemir 100 UNIT/ML injection Commonly known as: LEVEMIR Inject 40 Units into the skin every morning.   levonorgestrel 20 MCG/24HR IUD Commonly known as: MIRENA 1 each by Intrauterine route once.   LUBRICATING EYE DROPS OP Place 1 drop into both eyes daily as needed (dry eyes).   metFORMIN 1000 MG tablet Commonly known as: GLUCOPHAGE Take 1,000 mg by mouth 2 (two) times daily.   omeprazole 20 MG capsule Commonly known as: PRILOSEC Take 20 mg by mouth daily as needed (reflux).   prazosin 2 MG capsule Commonly known as: MINIPRESS Take 2 mg by mouth at bedtime.   QUEtiapine 50 MG tablet Commonly known as: SEROQUEL Take 75 mg by mouth at bedtime.   simvastatin 40 MG tablet Commonly known as: ZOCOR Take 40 mg by mouth at bedtime.   vitamin C 1000 MG tablet Take 2,000 mg by mouth daily.       Allergies:  Allergies  Allergen Reactions  . Bactrim [Sulfamethoxazole-Trimethoprim] Swelling  . Neurontin [Gabapentin] Itching    Family History: Family History  Problem Relation Age of Onset  . Aneurysm Mother   . Blindness Father   . High blood pressure Father   . Hypertension Sister   . Hypertension Sister   . Diabetes Neg Hx     Social History:  reports that she has quit smoking. Her smoking use included cigarettes. She quit after 1.00 year of use. She has never used smokeless tobacco. She reports previous alcohol use of about 1.0 standard drink of alcohol per week. She reports previous drug use. Drug: Marijuana.  ROS: Pertinent ROS in HPI  Physical Exam: BP 128/89   Pulse (!) 120   Ht 5\' 4"  (1.626 m)   Wt 170 lb (77.1 kg)   BMI 29.18 kg/m   Constitutional:  Well nourished. Alert and oriented, No acute distress. HEENT: [place] AT, mask in place.  Trachea midline Cardiovascular: No clubbing,  cyanosis, or edema. Respiratory: Normal respiratory effort, no increased work of breathing. Neurologic: Grossly intact, no focal deficits, moving all 4 extremities. Psychiatric: Normal mood and affect.   Laboratory Data: Lab Results  Component Value Date   WBC 9.2 01/16/2019   HGB 11.9 (L) 01/16/2019   HCT 36.9 01/16/2019   MCV 78.2 (L) 01/16/2019   PLT 349 01/16/2019    Lab Results  Component Value Date   CREATININE 0.79 01/16/2019    Lab Results  Component Value Date   HGBA1C 9.1 (H) 09/18/2013    Lab Results  Component Value Date   TSH 2.080 03/26/2017       Component Value Date/Time   CHOL 167 03/26/2017 1545   HDL 43 03/26/2017 1545   CHOLHDL 3.9 03/26/2017 1545   CHOLHDL 3.1 Ratio 01/13/2008 2035   VLDL 11 01/13/2008 2035   LDLCALC 105 (H) 03/26/2017 1545    Lab Results  Component Value Date   AST 17 05/24/2018  Lab Results  Component Value Date   ALT 14 05/24/2018    Urinalysis    Component Value Date/Time   COLORURINE Amber 12/28/2013 0225   APPEARANCEUR Hazy (A) 11/09/2019 1330   LABSPEC 1.044 12/28/2013 0225   PHURINE 5.0 12/28/2013 0225   PHURINE 7.0 11/25/2006 1018   GLUCOSEU Negative 11/09/2019 1330   GLUCOSEU Negative 12/28/2013 0225   HGBUR Negative 12/28/2013 0225   HGBUR TRACE (A) 11/25/2006 1018   BILIRUBINUR Negative 11/09/2019 1330   BILIRUBINUR Negative 12/28/2013 0225   KETONESUR Trace 12/28/2013 0225   KETONESUR 15 (A) 11/25/2006 1018   PROTEINUR Negative 11/09/2019 1330   PROTEINUR 100 mg/dL 12/28/2013 0225   PROTEINUR 30 (A) 11/25/2006 1018   UROBILINOGEN 1.0 11/25/2006 1018   NITRITE Negative 11/09/2019 1330   NITRITE Negative 12/28/2013 0225   NITRITE NEGATIVE 11/25/2006 1018   LEUKOCYTESUR Negative 11/09/2019 1330   LEUKOCYTESUR Negative 12/28/2013 0225  I have reviewed the labs.   Pertinent Imaging: Narrative & Impression  CLINICAL DATA:  44 year old female with concern for hydronephrosis.  EXAM: RENAL /  URINARY TRACT ULTRASOUND COMPLETE  COMPARISON:  Renal ultrasound dated 08/11/2019.  FINDINGS: Right Kidney:  Renal measurements: 11.9 x 5.2 x 5.3 cm = volume: 171 mL. Echogenicity within normal limits. No mass or hydronephrosis visualized.  Left Kidney:  Renal measurements: 11.1 x 5.4 x 4.6 cm = volume: 145 mL. Normal echogenicity. No hydronephrosis or shadowing stone. There is a 1.4 x 1.6 x 1.6 cm complex cyst in the inferior pole of the left kidney with probable septation and possible small associated calcification. This previously measured 1.3 x 1.3 x 1.6 cm.  Bladder:  Appears normal for degree of bladder distention.  Other:  None.  IMPRESSION: 1. No hydronephrosis or shadowing stone. 2. Slight interval increase in the complex cyst in the inferior pole of the left kidney compared to the ultrasound of 02/13/2019.   Electronically Signed   By: Anner Crete M.D.   On: 11/15/2019 18:20    I have independently reviewed the films.  See HPI.    Assessment & Plan:    1. Bilateral flank pain No etiology for her bilateral flank pain is seen on the kidney ultrasound We will go ahead and pursue a contrast CT for further evaluation of the complex cyst in her left kidney and also further evaluation for possible etiology for her pain  2. Pelvic pain Recent UA and urine culture were negative Kidney ultrasound did not identify an etiology for her pelvic pain We will pursue a contrast CT to further evaluate her pelvic organs for an etiology for her pain and urinary symptoms  3. Urgency UA urine culture negative CT scan pending May be the result of uncontrolled diabetes   Return for pending CT scan results .  These notes generated with voice recognition software. I apologize for typographical errors.  Zara Council, PA-C  [facility] [address]  [place], [place] [address] [phone]

## 2019-11-16 ENCOUNTER — Ambulatory Visit (INDEPENDENT_AMBULATORY_CARE_PROVIDER_SITE_OTHER): Payer: BC Managed Care – PPO | Admitting: Urology

## 2019-11-16 ENCOUNTER — Encounter: Payer: Self-pay | Admitting: Urology

## 2019-11-16 VITALS — BP 128/89 | HR 120 | Ht 64.0 in | Wt 170.0 lb

## 2019-11-16 DIAGNOSIS — R102 Pelvic and perineal pain: Secondary | ICD-10-CM | POA: Diagnosis not present

## 2019-11-16 DIAGNOSIS — R109 Unspecified abdominal pain: Secondary | ICD-10-CM

## 2019-11-24 DIAGNOSIS — F3181 Bipolar II disorder: Secondary | ICD-10-CM | POA: Diagnosis not present

## 2019-11-24 DIAGNOSIS — F900 Attention-deficit hyperactivity disorder, predominantly inattentive type: Secondary | ICD-10-CM | POA: Diagnosis not present

## 2019-11-24 DIAGNOSIS — F4310 Post-traumatic stress disorder, unspecified: Secondary | ICD-10-CM | POA: Diagnosis not present

## 2019-11-27 ENCOUNTER — Ambulatory Visit
Admission: RE | Admit: 2019-11-27 | Discharge: 2019-11-27 | Disposition: A | Discharge status: Home / Self Care | Payer: BC Managed Care – PPO | Source: Ambulatory Visit | Attending: Urology | Admitting: Urology

## 2019-11-27 ENCOUNTER — Other Ambulatory Visit: Payer: Self-pay

## 2019-11-27 DIAGNOSIS — R109 Unspecified abdominal pain: Secondary | ICD-10-CM | POA: Diagnosis not present

## 2019-11-27 DIAGNOSIS — R102 Pelvic and perineal pain: Secondary | ICD-10-CM | POA: Insufficient documentation

## 2019-11-27 DIAGNOSIS — N281 Cyst of kidney, acquired: Secondary | ICD-10-CM | POA: Diagnosis not present

## 2019-11-27 DIAGNOSIS — Z9049 Acquired absence of other specified parts of digestive tract: Secondary | ICD-10-CM | POA: Diagnosis not present

## 2019-11-27 MED ORDER — IOHEXOL 300 MG/ML  SOLN
100.0000 mL | Freq: Once | INTRAMUSCULAR | Status: AC | PRN
  Administered 2019-11-27: 100 mL via INTRAVENOUS

## 2019-11-28 ENCOUNTER — Telehealth: Payer: Self-pay | Admitting: Family Medicine

## 2019-11-28 NOTE — Telephone Encounter (Signed)
LMOM for patient to return call.

## 2019-11-28 NOTE — Telephone Encounter (Signed)
Spoke with patient and advised results   

## 2019-11-28 NOTE — Telephone Encounter (Signed)
-----   Message from Nori Riis, PA-C sent at 11/27/2019  3:06 PM EST ----- Please let Mrs. Macera know that the CT scan did not find a reason for her pain.  I would suggest following up with her gynecologist or PCP for further evaluation.

## 2019-12-06 ENCOUNTER — Encounter: Payer: Self-pay | Admitting: Urology

## 2019-12-08 DIAGNOSIS — F900 Attention-deficit hyperactivity disorder, predominantly inattentive type: Secondary | ICD-10-CM | POA: Diagnosis not present

## 2019-12-08 DIAGNOSIS — F4310 Post-traumatic stress disorder, unspecified: Secondary | ICD-10-CM | POA: Diagnosis not present

## 2019-12-08 DIAGNOSIS — F3181 Bipolar II disorder: Secondary | ICD-10-CM | POA: Diagnosis not present

## 2019-12-22 DIAGNOSIS — F3181 Bipolar II disorder: Secondary | ICD-10-CM | POA: Diagnosis not present

## 2019-12-22 DIAGNOSIS — F900 Attention-deficit hyperactivity disorder, predominantly inattentive type: Secondary | ICD-10-CM | POA: Diagnosis not present

## 2019-12-22 DIAGNOSIS — F4310 Post-traumatic stress disorder, unspecified: Secondary | ICD-10-CM | POA: Diagnosis not present

## 2019-12-26 DIAGNOSIS — F411 Generalized anxiety disorder: Secondary | ICD-10-CM | POA: Diagnosis not present

## 2019-12-26 DIAGNOSIS — F3181 Bipolar II disorder: Secondary | ICD-10-CM | POA: Diagnosis not present

## 2019-12-26 DIAGNOSIS — F4310 Post-traumatic stress disorder, unspecified: Secondary | ICD-10-CM | POA: Diagnosis not present

## 2019-12-26 DIAGNOSIS — F900 Attention-deficit hyperactivity disorder, predominantly inattentive type: Secondary | ICD-10-CM | POA: Diagnosis not present

## 2020-01-04 DIAGNOSIS — F4310 Post-traumatic stress disorder, unspecified: Secondary | ICD-10-CM | POA: Diagnosis not present

## 2020-01-04 DIAGNOSIS — F3181 Bipolar II disorder: Secondary | ICD-10-CM | POA: Diagnosis not present

## 2020-01-04 DIAGNOSIS — F900 Attention-deficit hyperactivity disorder, predominantly inattentive type: Secondary | ICD-10-CM | POA: Diagnosis not present

## 2020-01-08 ENCOUNTER — Other Ambulatory Visit: Payer: Self-pay

## 2020-01-08 ENCOUNTER — Ambulatory Visit (INDEPENDENT_AMBULATORY_CARE_PROVIDER_SITE_OTHER): Payer: BC Managed Care – PPO | Admitting: Certified Nurse Midwife

## 2020-01-08 ENCOUNTER — Other Ambulatory Visit (HOSPITAL_COMMUNITY)
Admission: RE | Admit: 2020-01-08 | Discharge: 2020-01-08 | Disposition: A | Discharge status: Home / Self Care | Payer: BC Managed Care – PPO | Source: Ambulatory Visit | Attending: Certified Nurse Midwife | Admitting: Certified Nurse Midwife

## 2020-01-08 ENCOUNTER — Encounter: Payer: Self-pay | Admitting: Certified Nurse Midwife

## 2020-01-08 VITALS — BP 127/80 | HR 106 | Ht 64.0 in | Wt 176.8 lb

## 2020-01-08 DIAGNOSIS — Z113 Encounter for screening for infections with a predominantly sexual mode of transmission: Secondary | ICD-10-CM | POA: Insufficient documentation

## 2020-01-08 DIAGNOSIS — N898 Other specified noninflammatory disorders of vagina: Secondary | ICD-10-CM | POA: Diagnosis not present

## 2020-01-08 DIAGNOSIS — Z23 Encounter for immunization: Secondary | ICD-10-CM | POA: Diagnosis not present

## 2020-01-08 NOTE — Progress Notes (Signed)
Patient is In a new relationship and her partner has something going on with his penis. He has been to doctor and he denies having anything. Patient is wanting to be checked to be safe. Patient is having some vaginal discharge white in color and vaginal itching. Denies vaginal odor.

## 2020-01-08 NOTE — Patient Instructions (Signed)

## 2020-01-08 NOTE — Progress Notes (Signed)
GYN ENCOUNTER NOTE  Subjective:       Rose [name] is a 45 y.o. G15P1011 female is here for gynecologic evaluation of the following issues:  1. Request STI testing-start new relationship in October 2021, states partner has "white dry skin" on his penis and is concerned since she started to have white discharge and vaginal itching  Denies difficulty breathing or respiratory distress, chest pain, abdominal pain, vaginal bleeding, dysuria, and leg pain or swelling.    Gynecologic History  No LMP recorded. (Menstrual status: IUD).  Contraception: IUD, Mirena  Last Pap: 06/2018. Results were: Neg/Neg  Last mammogram: 05/2017. Results were: BI-RADS 2  Obstetric History  OB History  Gravida Para Term Preterm AB Living  2 1 1   1 1   SAB IAB Ectopic Multiple Live Births  1       1    # Outcome Date GA Lbr Len/2nd Weight Sex Delivery Anes PTL Lv  2 SAB 2008          1 Term 1993    F CS-Unspec  N LIV    Past Medical History:  Diagnosis Date  . ADHD   . Anxiety   . Diabetes mellitus without complication ([place])   . Family history of adverse reaction to anesthesia    SISTER-TOO MUCH ANESTHESIA FOR C-SECTION  . GERD (gastroesophageal reflux disease)   . History of kidney stones   . Neck pain   . Right leg pain     Past Surgical History:  Procedure Laterality Date  . CESAREAN SECTION    . CHOLECYSTECTOMY    . CYSTOSCOPY W/ RETROGRADES  06/29/2019   Procedure: CYSTOSCOPY WITH RETROGRADE PYELOGRAM;  Surgeon: Billey Co, MD;  Location: [facility];  Service: Urology;;  . Consuela Mimes WITH RETROGRADE PYELOGRAM, URETEROSCOPY AND STENT PLACEMENT Left 01/17/2019   Procedure: CYSTOSCOPY WITH RETROGRADE PYELOGRAM, URETEROSCOPY AND STENT PLACEMENT;  Surgeon: Abbie Sons, MD;  Location: [facility];  Service: Urology;  Laterality: Left;  . CYSTOSCOPY/URETEROSCOPY/HOLMIUM LASER/STENT PLACEMENT Right 06/29/2019   Procedure: CYSTOSCOPY/URETEROSCOPY/HOLMIUM LASER/STENT PLACEMENT;  Surgeon:  Billey Co, MD;  Location: [facility];  Service: Urology;  Laterality: Right;  . EXTRACORPOREAL SHOCK WAVE LITHOTRIPSY Right 06/01/2019   Procedure: EXTRACORPOREAL SHOCK WAVE LITHOTRIPSY (ESWL);  Surgeon: Billey Co, MD;  Location: [facility];  Service: Urology;  Laterality: Right;  . kidney stone removal  01/21, 05/21, 06/21  . LAPAROSCOPY N/A 08/24/2013   Procedure: LAPAROSCOPY OPERATIVE with lysis of adhesions ;  Surgeon: Marylynn Pearson, MD;  Location: [facility];  Service: Gynecology;  Laterality: N/A;  . STONE EXTRACTION WITH BASKET Left 01/17/2019   Procedure: STONE EXTRACTION WITH BASKET;  Surgeon: Abbie Sons, MD;  Location: [facility];  Service: Urology;  Laterality: Left;  . wrist, cyst removal      Current Outpatient Medications on File Prior to Visit  Medication Sig Dispense Refill  . Ascorbic Acid (VITAMIN C) 1000 MG tablet Take 2,000 mg by mouth daily.    . busPIRone (BUSPAR) 30 MG tablet Take 30 mg by mouth 2 (two) times daily.    . Carboxymethylcellul-Glycerin (LUBRICATING EYE DROPS OP) Place 1 drop into both eyes daily as needed (dry eyes).    . clonazePAM (KLONOPIN) 0.25 MG disintegrating tablet Take 0.25 mg by mouth 2 (two) times daily.    Marland Kitchen EPINEPHrine 0.3 mg/0.3 mL IJ SOAJ injection Inject into the muscle.    Marland Kitchen glucose blood (ONE TOUCH ULTRA TEST) test strip 1 each by Other route  daily. And lancets 1/day 250.00 100 each 12  . insulin detemir (LEVEMIR) 100 UNIT/ML injection Inject 40 Units into the skin every morning.     Marland Kitchen levonorgestrel (MIRENA) 20 MCG/24HR IUD 1 each by Intrauterine route once.    . metFORMIN (GLUCOPHAGE) 1000 MG tablet Take 1,000 mg by mouth 2 (two) times daily.     Marland Kitchen omeprazole (PRILOSEC) 20 MG capsule Take 20 mg by mouth daily as needed (reflux).     . QUEtiapine (SEROQUEL) 50 MG tablet Take 75 mg by mouth at bedtime.    . Amphetamine ER 18.8 MG TBED Take 18.8 mg by mouth daily.  (Patient not taking: Reported on 01/08/2020)    . prazosin  (MINIPRESS) 2 MG capsule Take 2 mg by mouth at bedtime. (Patient not taking: Reported on 01/08/2020)    . simvastatin (ZOCOR) 40 MG tablet Take 40 mg by mouth at bedtime. (Patient not taking: Reported on 01/08/2020)     No current facility-administered medications on file prior to visit.    Allergies  Allergen Reactions  . Bactrim [Sulfamethoxazole-Trimethoprim] Swelling  . Neurontin [Gabapentin] Itching    Social History   Socioeconomic History  . Marital status: Single    Spouse name: Not on file  . Number of children: 1  . Years of education: GED  . Highest education level: Not on file  Occupational History    Employer: [employer]  Tobacco Use  . Smoking status: Former Smoker    Years: 1.00    Types: Cigarettes  . Smokeless tobacco: Never Used  Vaping Use  . Vaping Use: Some days  . Substances: CBD  Substance and Sexual Activity  . Alcohol use: Not Currently    Alcohol/week: 1.0 standard drink    Types: 1 Standard drinks or equivalent per week    Comment: WINE OCC  . Drug use: Not Currently    Types: Marijuana    Comment: last use 05/29/19  . Sexual activity: Not Currently    Birth control/protection: I.U.D.  Other Topics Concern  . Not on file  Social History Narrative   Patient is single and she takes care of her daughter. Patient works full time Web designer.   Education GED.   Right handed.   Caffeine one cup of coffee daily and soda daily.   Children One       Social Determinants of Health   Financial Resource Strain: Not on file  Food Insecurity: Not on file  Transportation Needs: Not on file  Physical Activity: Not on file  Stress: Not on file  Social Connections: Not on file  Intimate Partner Violence: Not on file    Family History  Problem Relation Age of Onset  . Aneurysm Mother   . Blindness Father   . High blood pressure Father   . Hypertension Sister   . Hypertension Sister   . Diabetes Neg Hx     The following portions of the  patient's history were reviewed and updated as appropriate: allergies, current medications, past family history, past medical history, past social history, past surgical history and problem list.  Review of Systems  ROS negative except as noted above. Information obtained from patient.   Objective:   BP 127/80   Pulse (!) 106   Ht 5\' 4"  (1.626 m)   Wt 176 lb 12.8 oz (80.2 kg)   BMI 30.35 kg/m    CONSTITUTIONAL: Well-developed, well-nourished female in no acute distress.   PELVIC:  External Genitalia: Normal  Vagina: Normal except white discharge present on sidewalls-swab collected  MUSCULOSKELETAL: Normal range of motion. No tenderness.  No cyanosis, clubbing, or edema.   Assessment:   1. Vaginal itching  - Cervicovaginal ancillary only  2. Vaginal discharge  - Cervicovaginal ancillary only  3. Routine screening for STI (sexually transmitted infection)  - HSV(herpes simplex vrs) 1+2 ab-IgG - RPR - HIV Antibody (routine testing w rflx) - Viral Hepatitis HBV, HCV - Cervicovaginal ancillary only    Plan:   Labs today, see orders. Patient requests to be contacted by phone with results due to difficulty signing into MyChart.   Reviewed red flag symptoms and when to call.   RTC as previously scheduled or sooner if needed.    Serafina Royals, CNM [facility], [facility] 01/08/20 5:09 PM

## 2020-01-09 LAB — VIRAL HEPATITIS HBV, HCV
HCV Ab: 0.1 s/co ratio (ref 0.0–0.9)
Hep B Core Total Ab: NEGATIVE
Hep B Surface Ab, Qual: NONREACTIVE
Hepatitis B Surface Ag: NEGATIVE

## 2020-01-09 LAB — HIV ANTIBODY (ROUTINE TESTING W REFLEX): HIV Screen 4th Generation wRfx: NONREACTIVE

## 2020-01-09 LAB — HCV INTERPRETATION

## 2020-01-09 LAB — HSV(HERPES SIMPLEX VRS) I + II AB-IGG
HSV 1 Glycoprotein G Ab, IgG: 53.4 index — ABNORMAL HIGH (ref 0.00–0.90)
HSV 2 IgG, Type Spec: <0.91 index (ref 0.00–0.90)

## 2020-01-09 LAB — RPR: RPR Ser Ql: NONREACTIVE

## 2020-01-10 LAB — CERVICOVAGINAL ANCILLARY ONLY
Bacterial Vaginitis (gardnerella): POSITIVE — AB
Candida Glabrata: POSITIVE — AB
Candida Vaginitis: NEGATIVE
Chlamydia: NEGATIVE
Comment: NEGATIVE
Comment: NEGATIVE
Comment: NEGATIVE
Comment: NEGATIVE
Comment: NEGATIVE
Comment: NORMAL
Neisseria Gonorrhea: NEGATIVE
Trichomonas: NEGATIVE

## 2020-01-11 NOTE — Telephone Encounter (Signed)
The pt called in and stated that she got her test results and that she is freaking out. I told the pt that it will take anywhere from 3-5 days for the provider to see and go over the test results, that the pt gets them first. The pt verbally understood. Please advise

## 2020-01-12 ENCOUNTER — Other Ambulatory Visit: Payer: Self-pay | Admitting: Certified Nurse Midwife

## 2020-01-12 DIAGNOSIS — B9689 Other specified bacterial agents as the cause of diseases classified elsewhere: Secondary | ICD-10-CM

## 2020-01-12 DIAGNOSIS — B379 Candidiasis, unspecified: Secondary | ICD-10-CM

## 2020-01-12 MED ORDER — METRONIDAZOLE 500 MG PO TABS
500.0000 mg | ORAL_TABLET | Freq: Two times a day (BID) | ORAL | 0 refills | Status: AC
Start: 2020-01-12 — End: 2020-01-19

## 2020-01-12 MED ORDER — FLUCONAZOLE 150 MG PO TABS: 150.0000 mg | ORAL_TABLET | Freq: Once | ORAL | 0 refills | Status: AC

## 2020-01-12 NOTE — Progress Notes (Signed)
Rx Flagyl and Diflucan, see orders.    Dani Gobble, CNM [facility], [facility] 01/12/20 11:49 AM

## 2020-01-19 DIAGNOSIS — F4310 Post-traumatic stress disorder, unspecified: Secondary | ICD-10-CM | POA: Diagnosis not present

## 2020-01-19 DIAGNOSIS — F900 Attention-deficit hyperactivity disorder, predominantly inattentive type: Secondary | ICD-10-CM | POA: Diagnosis not present

## 2020-01-19 DIAGNOSIS — F3181 Bipolar II disorder: Secondary | ICD-10-CM | POA: Diagnosis not present

## 2020-07-06 ENCOUNTER — Other Ambulatory Visit: Payer: Self-pay

## 2020-07-06 ENCOUNTER — Emergency Department
Admission: EM | Admit: 2020-07-06 | Discharge: 2020-07-06 | Disposition: A | Discharge status: Home / Self Care | Payer: Self-pay | Attending: Emergency Medicine | Admitting: Emergency Medicine

## 2020-07-06 ENCOUNTER — Emergency Department: Payer: Self-pay

## 2020-07-06 DIAGNOSIS — Z7984 Long term (current) use of oral hypoglycemic drugs: Secondary | ICD-10-CM | POA: Insufficient documentation

## 2020-07-06 DIAGNOSIS — B349 Viral infection, unspecified: Secondary | ICD-10-CM | POA: Insufficient documentation

## 2020-07-06 DIAGNOSIS — Z794 Long term (current) use of insulin: Secondary | ICD-10-CM | POA: Insufficient documentation

## 2020-07-06 DIAGNOSIS — R079 Chest pain, unspecified: Secondary | ICD-10-CM | POA: Insufficient documentation

## 2020-07-06 DIAGNOSIS — Z87891 Personal history of nicotine dependence: Secondary | ICD-10-CM | POA: Insufficient documentation

## 2020-07-06 DIAGNOSIS — J4520 Mild intermittent asthma, uncomplicated: Secondary | ICD-10-CM | POA: Insufficient documentation

## 2020-07-06 DIAGNOSIS — E119 Type 2 diabetes mellitus without complications: Secondary | ICD-10-CM | POA: Insufficient documentation

## 2020-07-06 LAB — BASIC METABOLIC PANEL
Anion gap: 10 (ref 5–15)
BUN: 11 mg/dL (ref 6–20)
CO2: 24 mmol/L (ref 22–32)
Calcium: 9.2 mg/dL (ref 8.9–10.3)
Chloride: 101 mmol/L (ref 98–111)
Creatinine, Ser: 0.79 mg/dL (ref 0.44–1.00)
GFR, Estimated: >60 mL/min (ref >60)
Glucose, Bld: 182 mg/dL — ABNORMAL HIGH (ref 70–99)
Potassium: 3.5 mmol/L (ref 3.5–5.1)
Sodium: 135 mmol/L (ref 135–145)

## 2020-07-06 LAB — CBC
HCT: 36.5 % (ref 36.0–46.0)
Hemoglobin: 11.8 g/dL — ABNORMAL LOW (ref 12.0–15.0)
MCH: 25.8 pg — ABNORMAL LOW (ref 26.0–34.0)
MCHC: 32.3 g/dL (ref 30.0–36.0)
MCV: 79.7 fL — ABNORMAL LOW (ref 80.0–100.0)
Platelets: 306 10*3/uL (ref 150–400)
RBC: 4.58 MIL/uL (ref 3.87–5.11)
RDW: 14.4 % (ref 11.5–15.5)
WBC: 9 10*3/uL (ref 4.0–10.5)
nRBC: 0 % (ref 0.0–0.2)

## 2020-07-06 LAB — TROPONIN I (HIGH SENSITIVITY): Troponin I (High Sensitivity): 2 ng/L (ref <18)

## 2020-07-06 MED ORDER — PREDNISONE 50 MG PO TABS: 50.0000 mg | ORAL_TABLET | Freq: Every day | ORAL | 0 refills | Status: DC

## 2020-07-06 MED ORDER — ALBUTEROL SULFATE HFA 108 (90 BASE) MCG/ACT IN AERS: 2.0000 | INHALATION_SPRAY | Freq: Four times a day (QID) | RESPIRATORY_TRACT | 2 refills | Status: DC | PRN

## 2020-07-06 NOTE — ED Notes (Signed)
See triage note. Pt c/o pressure in chest and intermittent weakness and dizziness for past 2-3d. Feels like she may have "the flu". Denies sharp pain in chest.

## 2020-07-06 NOTE — ED Triage Notes (Signed)
Pt presents via POV c/o chest pressure for last few days. Pt states has also been feeling some weakness and dizziness. Pt states felt like was "getting a cold", however didn't develop cough. Ambulatory to triage.

## 2020-07-06 NOTE — ED Notes (Signed)
Pt states she feels like she is developing a respiratory infection.

## 2020-07-06 NOTE — ED Provider Notes (Signed)
[facility] Emergency Department Provider Note  ____________________________________________  Time seen: Approximately 5:31 PM  I have reviewed the triage vital signs and the nursing notes.   HISTORY  Chief Complaint Chest Pain    HPI Rose [name] is a 45 y.o. female Who presents to the emergency department complaining of multiple URI symptoms over the past week.  Patient presents that she does not feel like she is getting any better and feels like her chest is tight.  No substernal pain.  She describes it as feeling like there is compression all the way around her chest.  She has had fevers and chills, nasal congestion, sore throat.  No cough.  No frank shortness of breath.  She has had some nausea but no emesis.  She has been taking over-the-counter medications without significant improvement.  Majority of the symptoms have improved but she states that she is primarily here because they have not fully resolved and her chest feels tight.  No cardiac history.  She does have a history of ADHD, anxiety, diabetes, GERD.         Past Medical History:  Diagnosis Date   ADHD    Anxiety    Diabetes mellitus without complication ([place])    Family history of adverse reaction to anesthesia    SISTER-TOO MUCH ANESTHESIA FOR C-SECTION   GERD (gastroesophageal reflux disease)    History of kidney stones    Neck pain    Right leg pain     Patient Active Problem List   Diagnosis Date Noted   Family history of high cholesterol 03/31/2017   Family history of hypertension 03/31/2017   IUD (intrauterine device) in place 03/31/2017   Hyperlipidemia 06/05/2016   Neck pain 09/19/2013   Right leg pain    Diabetes ([place]) 09/18/2013   Mood disorder ([place]) 04/20/2013   Mild intermittent asthma 12/06/2012   Diabetes mellitus type II, uncontrolled ([place]) 12/06/2012   Depression with anxiety 12/06/2012    Past Surgical History:  Procedure Laterality Date   CESAREAN  SECTION     CHOLECYSTECTOMY     CYSTOSCOPY W/ RETROGRADES  06/29/2019   Procedure: CYSTOSCOPY WITH RETROGRADE PYELOGRAM;  Surgeon: Billey Co, MD;  Location: [facility];  Service: Urology;;   CYSTOSCOPY WITH RETROGRADE PYELOGRAM, URETEROSCOPY AND STENT PLACEMENT Left 01/17/2019   Procedure: CYSTOSCOPY WITH RETROGRADE PYELOGRAM, URETEROSCOPY AND STENT PLACEMENT;  Surgeon: Abbie Sons, MD;  Location: [facility];  Service: Urology;  Laterality: Left;   CYSTOSCOPY/URETEROSCOPY/HOLMIUM LASER/STENT PLACEMENT Right 06/29/2019   Procedure: CYSTOSCOPY/URETEROSCOPY/HOLMIUM LASER/STENT PLACEMENT;  Surgeon: Billey Co, MD;  Location: [facility];  Service: Urology;  Laterality: Right;   EXTRACORPOREAL SHOCK WAVE LITHOTRIPSY Right 06/01/2019   Procedure: EXTRACORPOREAL SHOCK WAVE LITHOTRIPSY (ESWL);  Surgeon: Billey Co, MD;  Location: [facility];  Service: Urology;  Laterality: Right;   kidney stone removal  01/21, 05/21, 06/21   LAPAROSCOPY N/A 08/24/2013   Procedure: LAPAROSCOPY OPERATIVE with lysis of adhesions ;  Surgeon: Marylynn Pearson, MD;  Location: [facility];  Service: Gynecology;  Laterality: N/A;   STONE EXTRACTION WITH BASKET Left 01/17/2019   Procedure: STONE EXTRACTION WITH BASKET;  Surgeon: Abbie Sons, MD;  Location: [facility];  Service: Urology;  Laterality: Left;   wrist, cyst removal      Prior to Admission medications   Medication Sig Start Date End Date Taking? Authorizing Provider  albuterol (VENTOLIN HFA) 108 (90 Base) MCG/ACT inhaler Inhale 2 puffs into the lungs every 6 (six)  hours as needed for wheezing or shortness of breath. 07/06/20  Yes Shone Leventhal, Charline Bills, PA-C  predniSONE (DELTASONE) 50 MG tablet Take 1 tablet (50 mg total) by mouth daily with breakfast. 07/06/20  Yes Laith Antonelli, Charline Bills, PA-C  Amphetamine ER 18.8 MG TBED Take 18.8 mg by mouth daily.  Patient not taking: Reported on 01/08/2020    [provider]  Ascorbic Acid (VITAMIN C) 1000 MG tablet  Take 2,000 mg by mouth daily.    [provider]  busPIRone (BUSPAR) 30 MG tablet Take 30 mg by mouth 2 (two) times daily.    [provider]  Carboxymethylcellul-Glycerin (LUBRICATING EYE DROPS OP) Place 1 drop into both eyes daily as needed (dry eyes).    [provider]  clonazePAM (KLONOPIN) 0.25 MG disintegrating tablet Take 0.25 mg by mouth 2 (two) times daily. 10/19/19   [provider]  EPINEPHrine 0.3 mg/0.3 mL IJ SOAJ injection Inject into the muscle. 10/10/19   [provider]  glucose blood (ONE TOUCH ULTRA TEST) test strip 1 each by Other route daily. And lancets 1/day 250.00 09/18/13   Renato Shin, MD  insulin detemir (LEVEMIR) 100 UNIT/ML injection Inject 40 Units into the skin every morning.  09/15/16   [provider]  levonorgestrel (MIRENA) 20 MCG/24HR IUD 1 each by Intrauterine route once.    [provider]  metFORMIN (GLUCOPHAGE) 1000 MG tablet Take 1,000 mg by mouth 2 (two) times daily.     [provider]  omeprazole (PRILOSEC) 20 MG capsule Take 20 mg by mouth daily as needed (reflux).     [provider]  prazosin (MINIPRESS) 2 MG capsule Take 2 mg by mouth at bedtime. Patient not taking: Reported on 01/08/2020 10/19/19   [provider]  QUEtiapine (SEROQUEL) 50 MG tablet Take 75 mg by mouth at bedtime. 01/09/19   [provider]  simvastatin (ZOCOR) 40 MG tablet Take 40 mg by mouth at bedtime. Patient not taking: Reported on 01/08/2020 10/10/19   [provider]    Allergies Bactrim [sulfamethoxazole-trimethoprim] and Neurontin [gabapentin]  Family History  Problem Relation Age of Onset   Aneurysm Mother    Blindness Father    High blood pressure Father    Hypertension Sister    Hypertension Sister    Diabetes Neg Hx     Social History Social History   Tobacco Use   Smoking status: Former    Years: 1.00    Pack years: 0.00    Types: Cigarettes    Smokeless tobacco: Never  Vaping Use   Vaping Use: Some days   Substances: CBD  Substance Use Topics   Alcohol use: Not Currently    Alcohol/week: 1.0 standard drink    Types: 1 Standard drinks or equivalent per week    Comment: WINE OCC   Drug use: Not Currently    Types: Marijuana    Comment: last use 05/29/19     Review of Systems  Constitutional: No fever/chills Eyes: No visual changes. No discharge ENT: Positive for nasal congestion and sore throat Cardiovascular: Endorses chest tightness but no chest pain. Respiratory: no cough. No SOB. Gastrointestinal: No abdominal pain.  No nausea, no vomiting.  No diarrhea.  No constipation. Musculoskeletal: Negative for musculoskeletal pain. Skin: Negative for rash, abrasions, lacerations, ecchymosis. Neurological: Negative for headaches, focal weakness or numbness.  10 System ROS otherwise negative.  ____________________________________________   PHYSICAL EXAM:  VITAL SIGNS: ED Triage Vitals  Enc Vitals Group  BP 07/06/20 1311 (!) 137/93     Pulse Rate 07/06/20 1311 91     Resp 07/06/20 1311 16     Temp 07/06/20 1311 98.6 F (37 C)     Temp Source 07/06/20 1311 Oral     SpO2 07/06/20 1311 97 %     Weight --      Height --      Head Circumference --      Peak Flow --      Pain Score 07/06/20 1310 8     Pain Loc --      Pain Edu? --      Excl. in [place]? --      Constitutional: Alert and oriented. Well appearing and in no acute distress. Eyes: Conjunctivae are normal. PERRL. EOMI. Head: Atraumatic. ENT:      Ears:       Nose: No congestion/rhinnorhea.      Mouth/Throat: Mucous membranes are moist.  Neck: No stridor.  Neck is supple full range of motion Hematological/Lymphatic/Immunilogical: No cervical lymphadenopathy. Cardiovascular: Normal rate, regular rhythm. Normal S1 and S2.  Good peripheral circulation. Respiratory: Normal respiratory effort without tachypnea or retractions. Lungs CTAB. Good air entry to  the bases with no decreased or absent breath sounds. Gastrointestinal: Bowel sounds 4 quadrants. Soft and nontender to palpation. No guarding or rigidity. No palpable masses. No distention. No CVA tenderness. Musculoskeletal: Full range of motion to all extremities. No gross deformities appreciated. Neurologic:  Normal speech and language. No gross focal neurologic deficits are appreciated.  Skin:  Skin is warm, dry and intact. No rash noted. Psychiatric: Mood and affect are normal. Speech and behavior are normal. Patient exhibits appropriate insight and judgement.   ____________________________________________   LABS (all labs ordered are listed, but only abnormal results are displayed)  Labs Reviewed  BASIC METABOLIC PANEL - Abnormal; Notable for the following components:      Result Value   Glucose, Bld 182 (*)    All other components within normal limits  CBC - Abnormal; Notable for the following components:   Hemoglobin 11.8 (*)    MCV 79.7 (*)    MCH 25.8 (*)    All other components within normal limits  POC URINE PREG, ED  TROPONIN I (HIGH SENSITIVITY)  TROPONIN I (HIGH SENSITIVITY)   ____________________________________________  EKG  ED ECG REPORT I, Charline Bills Ayven Pheasant,  personally viewed and interpreted this ECG.   Date: 07/06/2020  EKG Time: 1304 hrs.  Rate: 87 bpm  Rhythm: unchanged from previous tracings, normal sinus rhythm  Axis: Normal axis  Intervals:none  ST&T Change: No gross ST elevation or depression  No STEMI.  Normal sinus rhythm.  No acute changes from previous EKG from 01/16/2019  ____________________________________________  RADIOLOGY I personally viewed and evaluated these images as part of my medical decision making, as well as reviewing the written report by the radiologist.  ED Provider Interpretation: No acute cardiopulmonary disease on chest x-ray  DG Chest 2 View  Result Date: 07/06/2020 CLINICAL DATA:  Acute chest pain for  several days. EXAM: CHEST - 2 VIEW COMPARISON:  05/19/2018 and prior radiographs FINDINGS: The cardiomediastinal silhouette is unremarkable. There is no evidence of focal airspace disease, pulmonary edema, suspicious pulmonary nodule/mass, pleural effusion, or pneumothorax. No acute bony abnormalities are identified. IMPRESSION: No active cardiopulmonary disease. Electronically Signed   By: Margarette Canada M.D.   On: 07/06/2020 13:34    ____________________________________________    PROCEDURES  Procedure(s) performed:  Procedures    Medications - No data to display     Pulmonary Embolism Rule-out Criteria (PERC rule)                        If YES to ANY of the following, the PERC rule is not satisfied and cannot be used to rule out PE in this patient (consider d-dimer or imaging depending on pre-test probability).                      If NO to ALL of the following, AND the clinician's pre-test probability is <15%, the [facility] rule is satisfied and there is no need for further workup (including no need to obtain a d-dimer) as the post-test probability of pulmonary embolism is <2%.                      Mnemonic is HAD CLOTS   H - hormone use (exogenous estrogen)      No. A - age > 50                                                 No. D - DVT/PE history                                      No.   C - coughing blood (hemoptysis)                 No. L - leg swelling, unilateral                             No. O - O2 Sat on Room Air < 95%                  No. T - tachycardia (HR ? 100)                         No. S - surgery or trauma, recent                      No.   Based on my evaluation of the patient, including application of this decision instrument, further testing to evaluate for pulmonary embolism is not indicated at this time. I have discussed this recommendation with the patient who states understanding and agreement with this  plan.  ____________________________________________   INITIAL IMPRESSION / ASSESSMENT AND PLAN / ED COURSE  Pertinent labs & imaging results that were available during my care of the patient were reviewed by me and considered in my medical decision making (see chart for details).  Review of the [place] CSRS was performed in accordance of the [place] prior to dispensing any controlled drugs.           Patient's diagnosis is consistent with viral illness, nonspecific chest pain.  Patient presents to the emergency department after a week of multiple viral URI symptoms.  Patient states that most of the symptoms have been improving but she had developed some chest tightness.  This was not described as substernal pain.  Labs, chest x-ray, EKG are reassuring.  Pain is not pleuritic in nature.No  associated cough.  No associated shortness of breath.  Vitals are reassuring. at this time I think that this is postpartum viral inflammation and patient will have albuterol and prednisone for symptomatic relief.  Return cautions discussed with the patient.  No indication for further work-up at this time. Patient is given ED precautions to return to the ED for any worsening or new symptoms.     ____________________________________________  FINAL CLINICAL IMPRESSION(S) / ED DIAGNOSES  Final diagnoses:  Viral illness  Nonspecific chest pain      NEW MEDICATIONS STARTED DURING THIS VISIT:  ED Discharge Orders          Ordered    predniSONE (DELTASONE) 50 MG tablet  Daily with breakfast        07/06/20 1746    albuterol (VENTOLIN HFA) 108 (90 Base) MCG/ACT inhaler  Every 6 hours PRN        07/06/20 1746                This chart was dictated using voice recognition software/Dragon. Despite best efforts to proofread, errors can occur which can change the meaning. Any change was purely unintentional.    Darletta Moll, PA-C 07/06/20 1747    Harvest Dark, MD 07/06/20 2336

## 2020-07-15 ENCOUNTER — Inpatient Hospital Stay
Admission: EM | Admit: 2020-07-15 | Discharge: 2020-07-18 | DRG: 690 | Disposition: A | Discharge status: Home / Self Care | Payer: Self-pay | Attending: Internal Medicine | Admitting: Internal Medicine

## 2020-07-15 ENCOUNTER — Emergency Department: Payer: Self-pay

## 2020-07-15 ENCOUNTER — Other Ambulatory Visit: Payer: Self-pay

## 2020-07-15 DIAGNOSIS — K219 Gastro-esophageal reflux disease without esophagitis: Secondary | ICD-10-CM | POA: Diagnosis present

## 2020-07-15 DIAGNOSIS — N12 Tubulo-interstitial nephritis, not specified as acute or chronic: Secondary | ICD-10-CM | POA: Diagnosis present

## 2020-07-15 DIAGNOSIS — IMO0002 Reserved for concepts with insufficient information to code with codable children: Secondary | ICD-10-CM | POA: Diagnosis present

## 2020-07-15 DIAGNOSIS — N10 Acute tubulo-interstitial nephritis: Principal | ICD-10-CM | POA: Diagnosis present

## 2020-07-15 DIAGNOSIS — Z87891 Personal history of nicotine dependence: Secondary | ICD-10-CM

## 2020-07-15 DIAGNOSIS — Z794 Long term (current) use of insulin: Secondary | ICD-10-CM

## 2020-07-15 DIAGNOSIS — Z20822 Contact with and (suspected) exposure to covid-19: Secondary | ICD-10-CM | POA: Diagnosis present

## 2020-07-15 DIAGNOSIS — F909 Attention-deficit hyperactivity disorder, unspecified type: Secondary | ICD-10-CM | POA: Diagnosis present

## 2020-07-15 DIAGNOSIS — R519 Headache, unspecified: Secondary | ICD-10-CM | POA: Diagnosis present

## 2020-07-15 DIAGNOSIS — N200 Calculus of kidney: Secondary | ICD-10-CM

## 2020-07-15 DIAGNOSIS — Z888 Allergy status to other drugs, medicaments and biological substances status: Secondary | ICD-10-CM

## 2020-07-15 DIAGNOSIS — Z87442 Personal history of urinary calculi: Secondary | ICD-10-CM

## 2020-07-15 DIAGNOSIS — Z7984 Long term (current) use of oral hypoglycemic drugs: Secondary | ICD-10-CM

## 2020-07-15 DIAGNOSIS — Z79899 Other long term (current) drug therapy: Secondary | ICD-10-CM

## 2020-07-15 DIAGNOSIS — Z882 Allergy status to sulfonamides status: Secondary | ICD-10-CM

## 2020-07-15 DIAGNOSIS — I700 Atherosclerosis of aorta: Secondary | ICD-10-CM | POA: Diagnosis present

## 2020-07-15 DIAGNOSIS — F419 Anxiety disorder, unspecified: Secondary | ICD-10-CM | POA: Diagnosis present

## 2020-07-15 DIAGNOSIS — E1165 Type 2 diabetes mellitus with hyperglycemia: Secondary | ICD-10-CM | POA: Diagnosis present

## 2020-07-15 LAB — COMPREHENSIVE METABOLIC PANEL
ALT: 12 U/L (ref 0–44)
AST: 18 U/L (ref 15–41)
Albumin: 3.2 g/dL — ABNORMAL LOW (ref 3.5–5.0)
Alkaline Phosphatase: 71 U/L (ref 38–126)
Anion gap: 8 (ref 5–15)
BUN: 17 mg/dL (ref 6–20)
CO2: 29 mmol/L (ref 22–32)
Calcium: 8.4 mg/dL — ABNORMAL LOW (ref 8.9–10.3)
Chloride: 97 mmol/L — ABNORMAL LOW (ref 98–111)
Creatinine, Ser: 1.03 mg/dL — ABNORMAL HIGH (ref 0.44–1.00)
GFR, Estimated: >60 mL/min (ref >60)
Glucose, Bld: 275 mg/dL — ABNORMAL HIGH (ref 70–99)
Potassium: 3.5 mmol/L (ref 3.5–5.1)
Sodium: 134 mmol/L — ABNORMAL LOW (ref 135–145)
Total Bilirubin: 0.7 mg/dL (ref 0.3–1.2)
Total Protein: 7.2 g/dL (ref 6.5–8.1)

## 2020-07-15 LAB — CBC
HCT: 34.5 % — ABNORMAL LOW (ref 36.0–46.0)
Hemoglobin: 11.3 g/dL — ABNORMAL LOW (ref 12.0–15.0)
MCH: 25.5 pg — ABNORMAL LOW (ref 26.0–34.0)
MCHC: 32.8 g/dL (ref 30.0–36.0)
MCV: 77.7 fL — ABNORMAL LOW (ref 80.0–100.0)
Platelets: 245 10*3/uL (ref 150–400)
RBC: 4.44 MIL/uL (ref 3.87–5.11)
RDW: 14.6 % (ref 11.5–15.5)
WBC: 14.9 10*3/uL — ABNORMAL HIGH (ref 4.0–10.5)
nRBC: 0 % (ref 0.0–0.2)

## 2020-07-15 LAB — URINALYSIS, ROUTINE W REFLEX MICROSCOPIC
Bilirubin Urine: NEGATIVE
Glucose, UA: 150 mg/dL — AB
Ketones, ur: NEGATIVE mg/dL
Nitrite: POSITIVE — AB
Protein, ur: >=300 mg/dL — AB
RBC / HPF: >50 RBC/hpf — ABNORMAL HIGH (ref 0–5)
Specific Gravity, Urine: 1.024 (ref 1.005–1.030)
WBC, UA: >50 WBC/hpf — ABNORMAL HIGH (ref 0–5)
pH: 5 (ref 5.0–8.0)

## 2020-07-15 LAB — RESP PANEL BY RT-PCR (FLU A&B, COVID) ARPGX2
Influenza A by PCR: NEGATIVE
Influenza B by PCR: NEGATIVE
SARS Coronavirus 2 by RT PCR: NEGATIVE

## 2020-07-15 LAB — HEMOGLOBIN A1C
Hgb A1c MFr Bld: 7.5 % — ABNORMAL HIGH (ref 4.8–5.6)
Mean Plasma Glucose: 168.55 mg/dL

## 2020-07-15 LAB — LACTIC ACID, PLASMA
Lactic Acid, Venous: 1 mmol/L (ref 0.5–1.9)
Lactic Acid, Venous: 1.7 mmol/L (ref 0.5–1.9)

## 2020-07-15 LAB — GLUCOSE, CAPILLARY
Glucose-Capillary: 170 mg/dL — ABNORMAL HIGH (ref 70–99)
Glucose-Capillary: 221 mg/dL — ABNORMAL HIGH (ref 70–99)

## 2020-07-15 LAB — POC URINE PREG, ED: Preg Test, Ur: NEGATIVE

## 2020-07-15 MED ORDER — SODIUM CHLORIDE 0.9 % IV BOLUS
500.0000 mL | Freq: Once | INTRAVENOUS | Status: AC
  Administered 2020-07-15: 500 mL via INTRAVENOUS

## 2020-07-15 MED ORDER — INSULIN ASPART 100 UNIT/ML IJ SOLN
0.0000 [IU] | Freq: Every day | INTRAMUSCULAR | Status: DC
  Administered 2020-07-15: 2 [IU] via SUBCUTANEOUS
  Filled 2020-07-15: qty 1

## 2020-07-15 MED ORDER — SODIUM CHLORIDE 0.9 % IV SOLN
1.0000 g | Freq: Once | INTRAVENOUS | Status: DC
  Filled 2020-07-15: qty 10

## 2020-07-15 MED ORDER — ASCORBIC ACID 500 MG PO TABS
2000.0000 mg | ORAL_TABLET | Freq: Every day | ORAL | Status: DC
  Administered 2020-07-15 – 2020-07-18 (×3): 2000 mg via ORAL
  Filled 2020-07-15 (×3): qty 4

## 2020-07-15 MED ORDER — ALBUTEROL SULFATE HFA 108 (90 BASE) MCG/ACT IN AERS: 2.0000 | INHALATION_SPRAY | Freq: Four times a day (QID) | RESPIRATORY_TRACT | Status: DC | PRN

## 2020-07-15 MED ORDER — SODIUM CHLORIDE 0.9 % IV BOLUS
1000.0000 mL | Freq: Once | INTRAVENOUS | Status: AC
  Administered 2020-07-15: 1000 mL via INTRAVENOUS

## 2020-07-15 MED ORDER — ENOXAPARIN SODIUM 40 MG/0.4ML IJ SOSY
0.5000 mg/kg | PREFILLED_SYRINGE | INTRAMUSCULAR | Status: DC
  Administered 2020-07-15 – 2020-07-17 (×3): 40 mg via SUBCUTANEOUS
  Filled 2020-07-15 (×3): qty 0.4

## 2020-07-15 MED ORDER — ADULT MULTIVITAMIN W/MINERALS CH
1.0000 | ORAL_TABLET | Freq: Every day | ORAL | Status: DC
  Administered 2020-07-15 – 2020-07-18 (×3): 1 via ORAL
  Filled 2020-07-15 (×3): qty 1

## 2020-07-15 MED ORDER — MORPHINE SULFATE (PF) 4 MG/ML IV SOLN
4.0000 mg | INTRAVENOUS | Status: DC | PRN
Start: 2020-07-15 — End: 2020-07-16
  Administered 2020-07-15 – 2020-07-16 (×7): 4 mg via INTRAVENOUS
  Filled 2020-07-15 (×7): qty 1

## 2020-07-15 MED ORDER — LACTATED RINGERS IV SOLN: INTRAVENOUS | Status: DC

## 2020-07-15 MED ORDER — ONDANSETRON HCL 4 MG PO TABS
4.0000 mg | ORAL_TABLET | Freq: Four times a day (QID) | ORAL | Status: DC | PRN
  Administered 2020-07-17: 4 mg via ORAL

## 2020-07-15 MED ORDER — ACETAMINOPHEN 650 MG RE SUPP: 650.0000 mg | Freq: Four times a day (QID) | RECTAL | Status: DC | PRN

## 2020-07-15 MED ORDER — IBUPROFEN 400 MG PO TABS
400.0000 mg | ORAL_TABLET | Freq: Once | ORAL | Status: AC
  Administered 2020-07-15: 400 mg via ORAL
  Filled 2020-07-15: qty 1

## 2020-07-15 MED ORDER — PANTOPRAZOLE SODIUM 40 MG IV SOLR
40.0000 mg | INTRAVENOUS | Status: DC
  Administered 2020-07-15: 40 mg via INTRAVENOUS
  Filled 2020-07-15: qty 40

## 2020-07-15 MED ORDER — ONDANSETRON HCL 4 MG/2ML IJ SOLN
4.0000 mg | Freq: Four times a day (QID) | INTRAMUSCULAR | Status: DC | PRN
  Administered 2020-07-15 – 2020-07-17 (×5): 4 mg via INTRAVENOUS
  Filled 2020-07-15 (×5): qty 2

## 2020-07-15 MED ORDER — ALBUTEROL SULFATE (2.5 MG/3ML) 0.083% IN NEBU: 2.5000 mg | INHALATION_SOLUTION | Freq: Four times a day (QID) | RESPIRATORY_TRACT | Status: DC | PRN

## 2020-07-15 MED ORDER — ZINC SULFATE 220 (50 ZN) MG PO CAPS
220.0000 mg | ORAL_CAPSULE | Freq: Every day | ORAL | Status: DC
  Administered 2020-07-16 – 2020-07-18 (×2): 220 mg via ORAL
  Filled 2020-07-15 (×4): qty 1

## 2020-07-15 MED ORDER — INSULIN ASPART 100 UNIT/ML IJ SOLN
0.0000 [IU] | Freq: Three times a day (TID) | INTRAMUSCULAR | Status: DC
  Administered 2020-07-15 – 2020-07-16 (×2): 3 [IU] via SUBCUTANEOUS
  Administered 2020-07-16 (×2): 8 [IU] via SUBCUTANEOUS
  Administered 2020-07-17: 2 [IU] via SUBCUTANEOUS
  Administered 2020-07-18: 5 [IU] via SUBCUTANEOUS
  Administered 2020-07-18: 8 [IU] via SUBCUTANEOUS
  Filled 2020-07-15 (×8): qty 1

## 2020-07-15 MED ORDER — ONDANSETRON HCL 4 MG/2ML IJ SOLN
4.0000 mg | Freq: Once | INTRAMUSCULAR | Status: AC
  Administered 2020-07-15: 4 mg via INTRAVENOUS
  Filled 2020-07-15: qty 2

## 2020-07-15 MED ORDER — ACETAMINOPHEN 325 MG PO TABS
650.0000 mg | ORAL_TABLET | Freq: Four times a day (QID) | ORAL | Status: DC | PRN
  Administered 2020-07-15 – 2020-07-17 (×5): 650 mg via ORAL
  Filled 2020-07-15 (×5): qty 2

## 2020-07-15 MED ORDER — SODIUM CHLORIDE 0.9 % IV SOLN
1.0000 g | Freq: Once | INTRAVENOUS | Status: AC
  Administered 2020-07-15: 1 g via INTRAVENOUS
  Filled 2020-07-15: qty 10

## 2020-07-15 MED ORDER — CARBOXYMETHYLCELLUL-GLYCERIN 0.5-0.9 % OP SOLN
1.0000 [drp] | Freq: Every day | OPHTHALMIC | Status: DC | PRN
  Filled 2020-07-15: qty 15

## 2020-07-15 MED ORDER — VITAMIN D 25 MCG (1000 UNIT) PO TABS
3000.0000 [IU] | ORAL_TABLET | Freq: Every day | ORAL | Status: DC
  Administered 2020-07-15 – 2020-07-18 (×3): 3000 [IU] via ORAL
  Filled 2020-07-15 (×3): qty 3

## 2020-07-15 NOTE — Plan of Care (Signed)

## 2020-07-15 NOTE — ED Provider Notes (Signed)
[facility] Emergency Department Provider Note    Event Date/Time   First MD Initiated Contact with Patient 07/15/20 1035     (approximate)  I have reviewed the triage vital signs and the nursing notes.   HISTORY  Chief Complaint UTI and Back Pain    HPI Rose [name] is a 45 y.o. female who presents to the ER for evaluation of right flank pain hematuria as well as chills and generalized malaise of the past 24 hours.  Is has been having intermittent flank pain for more than a day.  Has a history of kidney stones.  States that she is trying to manage with over-the-counter medication including Cystex and cranberry juice without relief.  No recent antibiotics.  Has had to have cystoscopy for history of kidney stones previously.   Past Medical History:  Diagnosis Date   ADHD    Anxiety    Diabetes mellitus without complication (HCC)    Family history of adverse reaction to anesthesia    SISTER-TOO MUCH ANESTHESIA FOR C-SECTION   GERD (gastroesophageal reflux disease)    History of kidney stones    Neck pain    Right leg pain    Family History  Problem Relation Age of Onset   Aneurysm Mother    Blindness Father    High blood pressure Father    Hypertension Sister    Hypertension Sister    Diabetes Neg Hx    Past Surgical History:  Procedure Laterality Date   CESAREAN SECTION     CHOLECYSTECTOMY     CYSTOSCOPY W/ RETROGRADES  06/29/2019   Procedure: CYSTOSCOPY WITH RETROGRADE PYELOGRAM;  Surgeon: Billey Co, MD;  Location: [facility] ORS;  Service: Urology;;   CYSTOSCOPY WITH RETROGRADE PYELOGRAM, URETEROSCOPY AND STENT PLACEMENT Left 01/17/2019   Procedure: CYSTOSCOPY WITH RETROGRADE PYELOGRAM, URETEROSCOPY AND STENT PLACEMENT;  Surgeon: Abbie Sons, MD;  Location: [facility];  Service: Urology;  Laterality: Left;   CYSTOSCOPY/URETEROSCOPY/HOLMIUM LASER/STENT PLACEMENT Right 06/29/2019   Procedure: CYSTOSCOPY/URETEROSCOPY/HOLMIUM  LASER/STENT PLACEMENT;  Surgeon: Billey Co, MD;  Location: [facility];  Service: Urology;  Laterality: Right;   EXTRACORPOREAL SHOCK WAVE LITHOTRIPSY Right 06/01/2019   Procedure: EXTRACORPOREAL SHOCK WAVE LITHOTRIPSY (ESWL);  Surgeon: Billey Co, MD;  Location: [facility];  Service: Urology;  Laterality: Right;   kidney stone removal  01/21, 05/21, 06/21   LAPAROSCOPY N/A 08/24/2013   Procedure: LAPAROSCOPY OPERATIVE with lysis of adhesions ;  Surgeon: Marylynn Pearson, MD;  Location: [facility];  Service: Gynecology;  Laterality: N/A;   STONE EXTRACTION WITH BASKET Left 01/17/2019   Procedure: STONE EXTRACTION WITH BASKET;  Surgeon: Abbie Sons, MD;  Location: [facility];  Service: Urology;  Laterality: Left;   wrist, cyst removal     Patient Active Problem List   Diagnosis Date Noted   Pyelonephritis 07/15/2020   History of kidney stones    GERD (gastroesophageal reflux disease)    Family history of high cholesterol 03/31/2017   Family history of hypertension 03/31/2017   IUD (intrauterine device) in place 03/31/2017   Hyperlipidemia 06/05/2016   Neck pain 09/19/2013   Right leg pain    Diabetes ([place]) 09/18/2013   Mood disorder ([place]) 04/20/2013   Mild intermittent asthma 12/06/2012   Diabetes mellitus type II, uncontrolled ([place]) 12/06/2012   Depression with anxiety 12/06/2012      Prior to Admission medications   Medication Sig Start Date End Date Taking? Authorizing Provider  albuterol (VENTOLIN HFA) 108 (90  Base) MCG/ACT inhaler Inhale 2 puffs into the lungs every 6 (six) hours as needed for wheezing or shortness of breath. 07/06/20  Yes Cuthriell, Charline Bills, PA-C  Ascorbic Acid (VITAMIN C) 1000 MG tablet Take 2,000 mg by mouth daily.   Yes [provider]  Carboxymethylcellul-Glycerin (LUBRICATING EYE DROPS OP) Place 1 drop into both eyes daily as needed (dry eyes).   Yes [provider]  cholecalciferol (VITAMIN D3) 25 MCG (1000 UNIT) tablet Take 3,000  Units by mouth daily.   Yes [provider]  EPINEPHrine 0.3 mg/0.3 mL IJ SOAJ injection Inject into the muscle. 10/10/19  Yes [provider]  insulin detemir (LEVEMIR) 100 UNIT/ML injection Inject 40 Units into the skin every morning.  09/15/16  Yes [provider]  metFORMIN (GLUCOPHAGE) 1000 MG tablet Take 1,000 mg by mouth 2 (two) times daily.    Yes [provider]  Multiple Vitamin (ONE-A-DAY ESSENTIAL PO) Take 1 tablet by mouth daily.   Yes [provider]  omeprazole (PRILOSEC) 20 MG capsule Take 20 mg by mouth daily as needed (reflux).    Yes [provider]  zinc gluconate 50 MG tablet Take 50 mg by mouth daily.   Yes [provider]  glucose blood (ONE TOUCH ULTRA TEST) test strip 1 each by Other route daily. And lancets 1/day 250.00 09/18/13   Renato Shin, MD  levonorgestrel [facility]) 20 MCG/24HR IUD 1 each by Intrauterine route once.    [provider]  predniSONE (DELTASONE) 50 MG tablet Take 1 tablet (50 mg total) by mouth daily with breakfast. Patient not taking: Reported on 07/15/2020 07/06/20   Cuthriell, Charline Bills, PA-C    Allergies Bactrim [sulfamethoxazole-trimethoprim] and Neurontin [gabapentin]    Social History Social History   Tobacco Use   Smoking status: Former    Years: 1.00    Pack years: 0.00    Types: Cigarettes   Smokeless tobacco: Never  Vaping Use   Vaping Use: Some days   Substances: CBD  Substance Use Topics   Alcohol use: Not Currently    Alcohol/week: 1.0 standard drink    Types: 1 Standard drinks or equivalent per week    Comment: WINE OCC   Drug use: Not Currently    Types: Marijuana    Comment: last use 05/29/19    Review of Systems Patient denies headaches, rhinorrhea, blurry vision, numbness, shortness of breath, chest pain, edema, cough, abdominal pain, nausea, vomiting, diarrhea, dysuria, fevers, rashes or hallucinations unless otherwise stated above in  HPI. ____________________________________________   PHYSICAL EXAM:  VITAL SIGNS: Vitals:   07/15/20 1458 07/15/20 1500  BP:    Pulse: (!) 135   Resp:  (!) 24  Temp:    SpO2:      Constitutional: Alert and oriented.  Eyes: Conjunctivae are normal.  Head: Atraumatic. Nose: No congestion/rhinnorhea. Mouth/Throat: Mucous membranes are moist.   Neck: No stridor. Painless ROM.  Cardiovascular: Normal rate, regular rhythm. Grossly normal heart sounds.  Good peripheral circulation. Respiratory: Normal respiratory effort.  No retractions. Lungs CTAB. Gastrointestinal: Soft and nontender. No distention. No abdominal bruits. +right CVA tenderness. Genitourinary:  Musculoskeletal: No lower extremity tenderness nor edema.  No joint effusions. Neurologic:  Normal speech and language. No gross focal neurologic deficits are appreciated. No facial droop Skin:  Skin is warm, dry and intact. No rash noted. Psychiatric: Mood and affect are normal. Speech and behavior are normal.  ____________________________________________   LABS (all labs ordered are listed, but only abnormal  results are displayed)  Results for orders placed or performed during the hospital encounter of 07/15/20 (from the past 24 hour(s))  Urinalysis, Routine w reflex microscopic     Status: Abnormal   Collection Time: 07/15/20  9:47 AM  Result Value Ref Range   Color, Urine AMBER (A) YELLOW   APPearance CLOUDY (A) CLEAR   Specific Gravity, Urine 1.024 1.005 - 1.030   pH 5.0 5.0 - 8.0   Glucose, UA 150 (A) NEGATIVE mg/dL   Hgb urine dipstick MODERATE (A) NEGATIVE   Bilirubin Urine NEGATIVE NEGATIVE   Ketones, ur NEGATIVE NEGATIVE mg/dL   Protein, ur >=300 (A) NEGATIVE mg/dL   Nitrite POSITIVE (A) NEGATIVE   Leukocytes,Ua MODERATE (A) NEGATIVE   RBC / HPF >50 (H) 0 - 5 RBC/hpf   WBC, UA >50 (H) 0 - 5 WBC/hpf   Bacteria, UA MANY (A) NONE SEEN   Squamous Epithelial / LPF 0-5 0 - 5   WBC Clumps PRESENT    Mucus  PRESENT    Budding Yeast PRESENT    Granular Casts, UA PRESENT    Non Squamous Epithelial PRESENT (A) NONE SEEN  POC urine preg, ED (not at [place])     Status: None   Collection Time: 07/15/20  9:50 AM  Result Value Ref Range   Preg Test, Ur NEGATIVE NEGATIVE  CBC     Status: Abnormal   Collection Time: 07/15/20 11:00 AM  Result Value Ref Range   WBC 14.9 (H) 4.0 - 10.5 K/uL   RBC 4.44 3.87 - 5.11 MIL/uL   Hemoglobin 11.3 (L) 12.0 - 15.0 g/dL   HCT 34.5 (L) 36.0 - 46.0 %   MCV 77.7 (L) 80.0 - 100.0 fL   MCH 25.5 (L) 26.0 - 34.0 pg   MCHC 32.8 30.0 - 36.0 g/dL   RDW 14.6 11.5 - 15.5 %   Platelets 245 150 - 400 K/uL   nRBC 0.0 0.0 - 0.2 %  Comprehensive metabolic panel     Status: Abnormal   Collection Time: 07/15/20 11:00 AM  Result Value Ref Range   Sodium 134 (L) 135 - 145 mmol/L   Potassium 3.5 3.5 - 5.1 mmol/L   Chloride 97 (L) 98 - 111 mmol/L   CO2 29 22 - 32 mmol/L   Glucose, Bld 275 (H) 70 - 99 mg/dL   BUN 17 6 - 20 mg/dL   Creatinine, Ser 1.03 (H) 0.44 - 1.00 mg/dL   Calcium 8.4 (L) 8.9 - 10.3 mg/dL   Total Protein 7.2 6.5 - 8.1 g/dL   Albumin 3.2 (L) 3.5 - 5.0 g/dL   AST 18 15 - 41 U/L   ALT 12 0 - 44 U/L   Alkaline Phosphatase 71 38 - 126 U/L   Total Bilirubin 0.7 0.3 - 1.2 mg/dL   GFR, Estimated >60 >60 mL/min   Anion gap 8 5 - 15  Resp Panel by RT-PCR (Flu A&B, Covid) Nasopharyngeal Swab     Status: None   Collection Time: 07/15/20  1:50 PM   Specimen: Nasopharyngeal Swab; Nasopharyngeal(NP) swabs in vial transport medium  Result Value Ref Range   SARS Coronavirus 2 by RT PCR NEGATIVE NEGATIVE   Influenza A by PCR NEGATIVE NEGATIVE   Influenza B by PCR NEGATIVE NEGATIVE   ____________________________________________  EKG____________________________________________  RADIOLOGY  I personally reviewed all radiographic images ordered to evaluate for the above acute complaints and reviewed radiology reports and findings.  These findings were personally  discussed with the patient.  Please see medical record for radiology report.  ____________________________________________   PROCEDURES  Procedure(s) performed:  Procedures    Critical Care performed: no ____________________________________________   INITIAL IMPRESSION / ASSESSMENT AND PLAN / ED COURSE  Pertinent labs & imaging results that were available during my care of the patient were reviewed by me and considered in my medical decision making (see chart for details).   DDX: pyelo, stone, AAA, colitis, appendicitis  Rose [name] is a 45 y.o. who presents to the ED with presentation as described above.  Patient does appear ill tachycardic low-grade temperature.  Presentation concerning for pyelonephritis clinically but given history of stone will order CT imaging.  Will provide IV fluids as well as IV narcotic medication. will check urine  Clinical Course as of 07/15/20 1524  Mon Jul 15, 2020  1430 Patient still with persistent pain persistently tachycardic.  Will give additional IV hydration.  Discussed case in consultation with urology agrees with plan for admission to hospitalist service for IV antibiotics reassessment no indication for urologic intervention at this time.  Have discussed with the patient and available family all diagnostics and treatments performed thus far and all questions were answered to the best of my ability. The patient demonstrates understanding and agreement with plan.  [PR]    Clinical Course User Index [PR] Merlyn Lot, MD    The patient was evaluated in Emergency Department today for the symptoms described in the history of present illness. He/she was evaluated in the context of the global COVID-19 pandemic, which necessitated consideration that the patient might be at risk for infection with the SARS-CoV-2 virus that causes COVID-19. Institutional protocols and algorithms that pertain to the evaluation of patients at risk for COVID-19  are in a state of rapid change based on information released by regulatory bodies including the CDC and federal and state organizations. These policies and algorithms were followed during the patient's care in the ED.  As part of my medical decision making, I reviewed the following data within the [place] notes reviewed and incorporated, Labs reviewed, notes from prior ED visits and [place] Controlled Substance Database   ____________________________________________   FINAL CLINICAL IMPRESSION(S) / ED DIAGNOSES  Final diagnoses:  Pyelonephritis      NEW MEDICATIONS STARTED DURING THIS VISIT:  New Prescriptions   No medications on file     Note:  This document was prepared using Dragon voice recognition software and may include unintentional dictation errors.    Merlyn Lot, MD 07/15/20 1524

## 2020-07-15 NOTE — ED Triage Notes (Signed)
Pt comes with c/o back pain and UTI. Pt states this started 3 days ago and she tried to treat it with medications. Pt also states some blood in urine.

## 2020-07-15 NOTE — ED Notes (Signed)
Spoke with Rose [name] on floor who said she'll put in a nurse assignment for room 221.

## 2020-07-15 NOTE — ED Notes (Signed)
Informed RN bed assigned 

## 2020-07-15 NOTE — ED Notes (Signed)
Has been up to the bathroom with assistance  States feels a little better  Is able to take po fluids  No vomiting

## 2020-07-15 NOTE — ED Notes (Signed)
Pt given another warm blanket as requested. Pt c/o pain and nausea again; provider Quentin Cornwall notified via secure chat. Pt laying calmly in bed.

## 2020-07-15 NOTE — ED Notes (Signed)
ED to IP handoff completed with CG, RN.

## 2020-07-15 NOTE — H&P (Signed)
History and Physical    Rose Dean [name] GBT:[id number] DOB: March 17, 1975 DOA: 07/15/2020  PCP: [facility], [facility]   Patient coming from: Home  I have personally briefly reviewed patient's old medical records in [place]  Chief Complaint: Fever/chills                               right-sided flank pain  HPI: Rose [name] is a 45 y.o. female with medical history significant for diabetes mellitus, GERD, anxiety disorder and history of nephrolithiasis who presents to the ER for evaluation of a 3-day history of right-sided flank pain associated with shaking chills, fevers, nausea, urgency, urinary frequency and dysuria.  She states that she has also had some hematuria.  She also complains of nausea as well as emesis Patient states that she took over-the-counter Cystex and cranberry juice without any improvement in her symptoms. She denies having any chest pain, no shortness of breath, no dizziness, no lightheadedness, no palpitations, no headache, no cough, no blurred vision, no changes in her bowel habits, no diaphoresis, no headache, no lower extremity swelling. Labs show sodium 134, potassium 3.5, chloride 97, bicarb 29, glucose 275, BUN 17, creatinine 1.03, calcium 8.4, alkaline phosphatase 71, albumin 3.2, AST 18, ALT 12, total protein 7.2, white count 14.9, hemoglobin 11.3, hematocrit 34.5, MCV 77, RDW 14.6, platelet count 245 Respiratory viral panel is negative Patient has pyuria Renal stone CT shows enlargement of RIGHT kidney with significant perinephric edema though without associated RIGHT hydronephrosis or hydroureter. This could represent pyelonephritis, recommend correlation with urinalysis. Single tiny new calcification RIGHT hemipelvis, near the expected position of the RIGHT ureter though lacking ureteral dilatation or hydronephrosis, question phlebolith versus tiny nonobstructing calculus. Aortic Atherosclerosis.   ED Course: Patient is a  45 year old female with a history of diabetes mellitus and nephrolithiasis who presents to the emergency room for evaluation of a 3-day history of right-sided flank pain associated with fever and shaking chills.  She also has urinary urgency, frequency, dysuria as well as hematuria. Renal stone CT shows right-sided pyelonephritis and patient has significant pyuria. She received a dose of IV Rocephin in the ER and will be admitted to the hospital for further evaluation.    Review of Systems: As per HPI otherwise all other systems reviewed and negative.    Past Medical History:  Diagnosis Date   ADHD    Anxiety    Diabetes mellitus without complication (HCC)    Family history of adverse reaction to anesthesia    SISTER-TOO MUCH ANESTHESIA FOR C-SECTION   GERD (gastroesophageal reflux disease)    History of kidney stones    Neck pain    Right leg pain     Past Surgical History:  Procedure Laterality Date   CESAREAN SECTION     CHOLECYSTECTOMY     CYSTOSCOPY W/ RETROGRADES  06/29/2019   Procedure: CYSTOSCOPY WITH RETROGRADE PYELOGRAM;  Surgeon: Billey Co, MD;  Location: [facility];  Service: Urology;;   [place], URETEROSCOPY AND STENT PLACEMENT Left 01/17/2019   Procedure: CYSTOSCOPY WITH RETROGRADE PYELOGRAM, URETEROSCOPY AND STENT PLACEMENT;  Surgeon: Abbie Sons, MD;  Location: [facility];  Service: Urology;  Laterality: Left;   CYSTOSCOPY/URETEROSCOPY/HOLMIUM LASER/STENT PLACEMENT Right 06/29/2019   Procedure: CYSTOSCOPY/URETEROSCOPY/HOLMIUM LASER/STENT PLACEMENT;  Surgeon: Billey Co, MD;  Location: [facility];  Service: Urology;  Laterality: Right;   EXTRACORPOREAL SHOCK WAVE LITHOTRIPSY Right 06/01/2019   Procedure:  EXTRACORPOREAL SHOCK WAVE LITHOTRIPSY (ESWL);  Surgeon: Billey Co, MD;  Location: [facility];  Service: Urology;  Laterality: Right;   kidney stone removal  01/21, 05/21, 06/21   LAPAROSCOPY N/A 08/24/2013   Procedure:  LAPAROSCOPY OPERATIVE with lysis of adhesions ;  Surgeon: Marylynn Pearson, MD;  Location: [facility];  Service: Gynecology;  Laterality: N/A;   STONE EXTRACTION WITH BASKET Left 01/17/2019   Procedure: STONE EXTRACTION WITH BASKET;  Surgeon: Abbie Sons, MD;  Location: [facility];  Service: Urology;  Laterality: Left;   wrist, cyst removal       reports that she has quit smoking. Her smoking use included cigarettes. She has never used smokeless tobacco. She reports previous alcohol use of about 1.0 standard drink of alcohol per week. She reports previous drug use. Drug: Marijuana.  Allergies  Allergen Reactions   Bactrim [Sulfamethoxazole-Trimethoprim] Swelling   Neurontin [Gabapentin] Itching    Family History  Problem Relation Age of Onset   Aneurysm Mother    Blindness Father    High blood pressure Father    Hypertension Sister    Hypertension Sister    Diabetes Neg Hx       Prior to Admission medications   Medication Sig Start Date End Date Taking? Authorizing Provider  albuterol (VENTOLIN HFA) 108 (90 Base) MCG/ACT inhaler Inhale 2 puffs into the lungs every 6 (six) hours as needed for wheezing or shortness of breath. 07/06/20   Cuthriell, Charline Bills, PA-C  Amphetamine ER 18.8 MG TBED Take 18.8 mg by mouth daily.  Patient not taking: Reported on 01/08/2020    [provider]  Ascorbic Acid (VITAMIN C) 1000 MG tablet Take 2,000 mg by mouth daily.    [provider]  busPIRone (BUSPAR) 30 MG tablet Take 30 mg by mouth 2 (two) times daily.    [provider]  Carboxymethylcellul-Glycerin (LUBRICATING EYE DROPS OP) Place 1 drop into both eyes daily as needed (dry eyes).    [provider]  clonazePAM (KLONOPIN) 0.25 MG disintegrating tablet Take 0.25 mg by mouth 2 (two) times daily. 10/19/19   [provider]  EPINEPHrine 0.3 mg/0.3 mL IJ SOAJ injection Inject into the muscle. 10/10/19   [provider]  glucose blood (ONE TOUCH ULTRA  TEST) test strip 1 each by Other route daily. And lancets 1/day 250.00 09/18/13   Renato Shin, MD  insulin detemir (LEVEMIR) 100 UNIT/ML injection Inject 40 Units into the skin every morning.  09/15/16   [provider]  levonorgestrel (MIRENA) 20 MCG/24HR IUD 1 each by Intrauterine route once.    [provider]  metFORMIN (GLUCOPHAGE) 1000 MG tablet Take 1,000 mg by mouth 2 (two) times daily.     [provider]  omeprazole (PRILOSEC) 20 MG capsule Take 20 mg by mouth daily as needed (reflux).     [provider]  prazosin (MINIPRESS) 2 MG capsule Take 2 mg by mouth at bedtime. Patient not taking: Reported on 01/08/2020 10/19/19   [provider]  predniSONE (DELTASONE) 50 MG tablet Take 1 tablet (50 mg total) by mouth daily with breakfast. 07/06/20   Cuthriell, Charline Bills, PA-C  QUEtiapine (SEROQUEL) 50 MG tablet Take 75 mg by mouth at bedtime. 01/09/19   [provider]  simvastatin (ZOCOR) 40 MG tablet Take 40 mg by mouth at bedtime. Patient not taking: Reported on 01/08/2020 10/10/19   [provider]    Physical Exam: Vitals:   07/15/20 1434 07/15/20 1455 07/15/20 1458  07/15/20 1500  BP: 120/70     Pulse: 100  (!) 135   Resp: 18   (!) 24  Temp: 98.6 F (37 C)     TempSrc: Oral     SpO2: 100% 98%    Weight:      Height:         Vitals:   07/15/20 1434 07/15/20 1455 07/15/20 1458 07/15/20 1500  BP: 120/70     Pulse: 100  (!) 135   Resp: 18   (!) 24  Temp: 98.6 F (37 C)     TempSrc: Oral     SpO2: 100% 98%    Weight:      Height:          Constitutional: Alert and oriented x 3 .  Has shaking chills and acutely ill-appearing  HEENT:      Head: Normocephalic and atraumatic.         Eyes: PERLA, EOMI, Conjunctivae are normal. Sclera is non-icteric.       Mouth/Throat: Mucous membranes are dry       Neck: Supple with no signs of meningismus. Cardiovascular: Tachycardic. No murmurs, gallops, or rubs. 2+ symmetrical  distal pulses are present . No JVD. No LE edema Respiratory: Respiratory effort normal .Lungs sounds clear bilaterally. No wheezes, crackles, or rhonchi.  Gastrointestinal: Soft, non tender, and non distended with positive bowel sounds.  Genitourinary: Right CVA tenderness. Musculoskeletal: Nontender with normal range of motion in all extremities. No cyanosis, or erythema of extremities. Neurologic:  Face is symmetric. Moving all extremities. No gross focal neurologic deficits . Skin: Skin is warm, dry.  No rash or ulcers Psychiatric: Mood and affect are normal    Labs on Admission: I have personally reviewed following labs and imaging studies  CBC: Recent Labs  Lab 07/15/20 1100  WBC 14.9*  HGB 11.3*  HCT 34.5*  MCV 77.7*  PLT 381   Basic Metabolic Panel: Recent Labs  Lab 07/15/20 1100  NA 134*  K 3.5  CL 97*  CO2 29  GLUCOSE 275*  BUN 17  CREATININE 1.03*  CALCIUM 8.4*   GFR: Estimated Creatinine Clearance: 70.7 mL/min (A) (by C-G formula based on SCr of 1.03 mg/dL (H)). Liver Function Tests: Recent Labs  Lab 07/15/20 1100  AST 18  ALT 12  ALKPHOS 71  BILITOT 0.7  PROT 7.2  ALBUMIN 3.2*   No results for input(s): LIPASE, AMYLASE in the last 168 hours. No results for input(s): AMMONIA in the last 168 hours. Coagulation Profile: No results for input(s): INR, PROTIME in the last 168 hours. Cardiac Enzymes: No results for input(s): CKTOTAL, CKMB, CKMBINDEX, TROPONINI in the last 168 hours. BNP (last 3 results) No results for input(s): PROBNP in the last 8760 hours. HbA1C: No results for input(s): HGBA1C in the last 72 hours. CBG: No results for input(s): GLUCAP in the last 168 hours. Lipid Profile: No results for input(s): CHOL, HDL, LDLCALC, TRIG, CHOLHDL, LDLDIRECT in the last 72 hours. Thyroid Function Tests: No results for input(s): TSH, T4TOTAL, FREET4, T3FREE, THYROIDAB in the last 72 hours. Anemia Panel: No results for input(s): VITAMINB12,  FOLATE, FERRITIN, TIBC, IRON, RETICCTPCT in the last 72 hours. Urine analysis:    Component Value Date/Time   COLORURINE AMBER (A) 07/15/2020 0947   APPEARANCEUR CLOUDY (A) 07/15/2020 0947   APPEARANCEUR Hazy (A) 11/09/2019 1330   LABSPEC 1.024 07/15/2020 0947   LABSPEC 1.044 12/28/2013 0225   PHURINE 5.0 07/15/2020 0947   GLUCOSEU 150 (A)  07/15/2020 0947   GLUCOSEU Negative 12/28/2013 0225   HGBUR MODERATE (A) 07/15/2020 0947   BILIRUBINUR NEGATIVE 07/15/2020 0947   BILIRUBINUR Negative 11/09/2019 1330   BILIRUBINUR Negative 12/28/2013 0225   KETONESUR NEGATIVE 07/15/2020 0947   PROTEINUR >=300 (A) 07/15/2020 0947   UROBILINOGEN 1.0 11/25/2006 1018   NITRITE POSITIVE (A) 07/15/2020 0947   LEUKOCYTESUR MODERATE (A) 07/15/2020 0947   LEUKOCYTESUR Negative 12/28/2013 0225    Radiological Exams on Admission: CT Renal Stone Study  Result Date: 07/15/2020 CLINICAL DATA:  RIGHT flank pain, UTI, back pain, symptoms for 3 days, some blood in urine EXAM: CT ABDOMEN AND PELVIS WITHOUT CONTRAST TECHNIQUE: Multidetector CT imaging of the abdomen and pelvis was performed following the standard protocol without IV contrast. Sagittal and coronal MPR images reconstructed from axial data set. No oral contrast administered. COMPARISON:  11/27/2019 FINDINGS: Lower chest: Mild RIGHT basilar atelectasis Hepatobiliary: Gallbladder surgically absent.  Liver unremarkable. Pancreas: Normal appearance Spleen: Normal appearance Adrenals/Urinary Tract: Adrenal glands, LEFT kidney and LEFT ureter normal appearance. RIGHT kidney enlarged with perinephric edema. No RIGHT renal mass. No RIGHT hydronephrosis or hydroureter. No definite urinary tract calcification visualized. Bladder unremarkable. A new tiny calcification is seen in the RIGHT hemipelvis however, near the expected position of the distal RIGHT ureter, potentially phlebolith though a tiny nonobstructing distal RIGHT ureteral calculus is not excluded (image  74). Stomach/Bowel: Normal appendix. Stomach and bowel loops normal appearance. Vascular/Lymphatic: Atherosclerotic calcification aorta without aneurysm. No adenopathy. Few scattered normal size retroperitoneal lymph nodes. Scattered pelvic phleboliths. Reproductive: IUD within uterus. Otherwise unremarkable uterus and adnexa Other: No free air or free fluid.  No hernia. Musculoskeletal: Unremarkable IMPRESSION: Enlargement of RIGHT kidney with significant perinephric edema though without associated RIGHT hydronephrosis or hydroureter. This could represent pyelonephritis, recommend correlation with urinalysis. Single tiny new calcification RIGHT hemipelvis, near the expected position of the RIGHT ureter though lacking ureteral dilatation or hydronephrosis, question phlebolith versus tiny nonobstructing calculus. Aortic Atherosclerosis (ICD10-I70.0). Electronically Signed   By: Lavonia Dana M.D.   On: 07/15/2020 11:11     Assessment/Plan Principal Problem:   Pyelonephritis Active Problems:   Diabetes mellitus type II, uncontrolled ([place])   History of kidney stones   GERD (gastroesophageal reflux disease)     Acute pyelonephritis Patient presents for evaluation of right flank pain associated with shaking chills, fever and urinary symptoms She has significant pyuria and imaging shows right-sided pyelonephritis Will place patient empirically on Rocephin 1 g IV daily until urine culture results become available Follow-up results of blood and urine culture Aggressive IV fluid resuscitation   Diabetes mellitus Uncontrolled Continue IV fluid hydration Glycemic control with sliding scale insulin Hold Levemir for now since patient has poor oral intake    GERD Place patient on IV PPI  DVT prophylaxis: Lovenox  Code Status: full code  Family Communication: Greater than 50% of time was spent discussing patient's condition and plan of care with her at the bedside.  All questions and concerns have  been addressed.  She verbalizes understanding and agrees with the plan. Disposition Plan: Back to previous home environment Consults called: none  Status: At the time of admission, it appears that the appropriate admission status for this patient is inpatient. This is judged to be reasonable and necessary in order to provide the required intensity of service to ensure the patient's safety given the presenting symptoms, physical exam findings, and initial radiographic and laboratory data in the context of their comorbid conditions. Patient requires inpatient status due  to high intensity of service, high risk of further deterioration and high frequency of surveillance required.    Collier Bullock MD [facility]     07/15/2020, 3:07 PM

## 2020-07-15 NOTE — ED Notes (Signed)
Pt laying on her R side in bed when BP cuff went off.

## 2020-07-15 NOTE — Consult Note (Signed)
Urology Consult  I have been asked to see the patient by Dr. Francine Graven, for evaluation and management of right pyelonephritis with possible right UVJ stone.  Chief Complaint: Right flank and abdominal pain, fever, chills, nausea  History of Present Illness: Rose [name] is a 45 y.o. year old female with PMH nephrolithiasis and uncontrolled diabetes who presented to the ED today with reports of a 3-day history of right flank pain associated with abdominal pain, fever, chills, and nausea.  Admission labs notable for UA with nitrites, >50 WBCs/hpf, >50 RBCs/hpf, many bacteria, WBC clumps, budding yeast, and non-squamous epithelial cells; WBC count 14.9; and creatinine 1.03 (baseline 0.8).  She has been febrile this afternoon, tachycardic, and borderline hypotensive.  CT stone study revealed an enlarged right kidney with significant perinephric edema without right hydroureteronephrosis.  There was a small calcification within the right hemipelvis near the expected UVJ, however in the absence of hydroureteronephrosis it is difficult to distinguish this as a ureteral stone versus phlebolith.  Antibiotics have been started as below.  Today she reports her right flank pain was preceded by 3 days of dysuria.  She suspected she had a bladder infection and was taking over-the-counter supplements to manage this.  Anti-infectives (From admission, onward)    Start     Dose/Rate Route Frequency Ordered Stop   07/16/20 1200  cefTRIAXone (ROCEPHIN) 1 g in sodium chloride 0.9 % 100 mL IVPB        1 g 200 mL/hr over 30 Minutes Intravenous  Once 07/15/20 1507     07/15/20 1145  cefTRIAXone (ROCEPHIN) 1 g in sodium chloride 0.9 % 100 mL IVPB        1 g 200 mL/hr over 30 Minutes Intravenous  Once 07/15/20 1139 07/15/20 1225       Past Medical History:  Diagnosis Date   ADHD    Anxiety    Diabetes mellitus without complication (HCC)    Family history of adverse reaction to anesthesia    SISTER-TOO  MUCH ANESTHESIA FOR C-SECTION   GERD (gastroesophageal reflux disease)    History of kidney stones    Neck pain    Right leg pain     Past Surgical History:  Procedure Laterality Date   CESAREAN SECTION     CHOLECYSTECTOMY     CYSTOSCOPY W/ RETROGRADES  06/29/2019   Procedure: CYSTOSCOPY WITH RETROGRADE PYELOGRAM;  Surgeon: Billey Co, MD;  Location: [facility];  Service: Urology;;   CYSTOSCOPY WITH RETROGRADE PYELOGRAM, URETEROSCOPY AND STENT PLACEMENT Left 01/17/2019   Procedure: [place], URETEROSCOPY AND STENT PLACEMENT;  Surgeon: Abbie Sons, MD;  Location: [facility];  Service: Urology;  Laterality: Left;   CYSTOSCOPY/URETEROSCOPY/HOLMIUM LASER/STENT PLACEMENT Right 06/29/2019   Procedure: CYSTOSCOPY/URETEROSCOPY/HOLMIUM LASER/STENT PLACEMENT;  Surgeon: Billey Co, MD;  Location: [facility];  Service: Urology;  Laterality: Right;   EXTRACORPOREAL SHOCK WAVE LITHOTRIPSY Right 06/01/2019   Procedure: EXTRACORPOREAL SHOCK WAVE LITHOTRIPSY (ESWL);  Surgeon: Billey Co, MD;  Location: [facility];  Service: Urology;  Laterality: Right;   kidney stone removal  01/21, 05/21, 06/21   LAPAROSCOPY N/A 08/24/2013   Procedure: LAPAROSCOPY OPERATIVE with lysis of adhesions ;  Surgeon: Marylynn Pearson, MD;  Location: [facility];  Service: Gynecology;  Laterality: N/A;   STONE EXTRACTION WITH BASKET Left 01/17/2019   Procedure: STONE EXTRACTION WITH BASKET;  Surgeon: Abbie Sons, MD;  Location: [facility];  Service: Urology;  Laterality: Left;   wrist, cyst removal  Home Medications:  Current Meds  Medication Sig   albuterol (VENTOLIN HFA) 108 (90 Base) MCG/ACT inhaler Inhale 2 puffs into the lungs every 6 (six) hours as needed for wheezing or shortness of breath.   Ascorbic Acid (VITAMIN C) 1000 MG tablet Take 2,000 mg by mouth daily.   Carboxymethylcellul-Glycerin (LUBRICATING EYE DROPS OP) Place 1 drop into both eyes daily as needed (dry eyes).    cholecalciferol (VITAMIN D3) 25 MCG (1000 UNIT) tablet Take 3,000 Units by mouth daily.   EPINEPHrine 0.3 mg/0.3 mL IJ SOAJ injection Inject into the muscle.   insulin detemir (LEVEMIR) 100 UNIT/ML injection Inject 40 Units into the skin every morning.    metFORMIN (GLUCOPHAGE) 1000 MG tablet Take 1,000 mg by mouth 2 (two) times daily.    Multiple Vitamin (ONE-A-DAY ESSENTIAL PO) Take 1 tablet by mouth daily.   omeprazole (PRILOSEC) 20 MG capsule Take 20 mg by mouth daily as needed (reflux).    zinc gluconate 50 MG tablet Take 50 mg by mouth daily.    Allergies:  Allergies  Allergen Reactions   Bactrim [Sulfamethoxazole-Trimethoprim] Swelling   Neurontin [Gabapentin] Itching    Family History  Problem Relation Age of Onset   Aneurysm Mother    Blindness Father    High blood pressure Father    Hypertension Sister    Hypertension Sister    Diabetes Neg Hx     Social History:  reports that she has quit smoking. Her smoking use included cigarettes. She has never used smokeless tobacco. She reports previous alcohol use of about 1.0 standard drink of alcohol per week. She reports previous drug use. Drug: Marijuana.  ROS: A complete review of systems was performed.  All systems are negative except for pertinent findings as noted.  Physical Exam:  Vital signs in last 24 hours: Temp:  [98.6 F (37 C)-100.7 F (38.2 C)] 100.7 F (38.2 C) (07/11 1556) Pulse Rate:  [100-135] 128 (07/11 1557) Resp:  [17-24] 17 (07/11 1557) BP: (96-128)/(59-79) 96/59 (07/11 1600) SpO2:  [96 %-100 %] 96 % (07/11 1557) Weight:  [80.2 kg] 80.2 kg (07/11 1013) Constitutional:  Alert and oriented, no acute distress, ill-appearing HEENT: [place] AT, moist mucus membranes Cardiovascular: No clubbing, cyanosis, or edema Respiratory: Normal respiratory effort Skin: No rashes, bruises or suspicious lesions Lymph: No cervical or inguinal adenopathy Neurologic: Grossly intact, no focal deficits, moving all 4  extremities Psychiatric: Normal mood and affect  Laboratory Data:  Recent Labs    07/15/20 1100  WBC 14.9*  HGB 11.3*  HCT 34.5*   Recent Labs    07/15/20 1100  NA 134*  K 3.5  CL 97*  CO2 29  GLUCOSE 275*  BUN 17  CREATININE 1.03*  CALCIUM 8.4*   Urinalysis    Component Value Date/Time   COLORURINE AMBER (A) 07/15/2020 0947   APPEARANCEUR CLOUDY (A) 07/15/2020 0947   APPEARANCEUR Hazy (A) 11/09/2019 1330   LABSPEC 1.024 07/15/2020 0947   LABSPEC 1.044 12/28/2013 0225   PHURINE 5.0 07/15/2020 0947   GLUCOSEU 150 (A) 07/15/2020 0947   GLUCOSEU Negative 12/28/2013 0225   HGBUR MODERATE (A) 07/15/2020 [place] 07/15/2020 0947   BILIRUBINUR Negative 11/09/2019 1330   BILIRUBINUR Negative 12/28/2013 0225   KETONESUR NEGATIVE 07/15/2020 0947   PROTEINUR >=300 (A) 07/15/2020 0947   UROBILINOGEN 1.0 11/25/2006 1018   NITRITE POSITIVE (A) 07/15/2020 0947   LEUKOCYTESUR MODERATE (A) 07/15/2020 0947   LEUKOCYTESUR Negative 12/28/2013 0225   Results for orders  placed or performed during the hospital encounter of 07/15/20  Resp Panel by RT-PCR (Flu A&B, Covid) Nasopharyngeal Swab     Status: None   Collection Time: 07/15/20  1:50 PM   Specimen: Nasopharyngeal Swab; Nasopharyngeal(NP) swabs in vial transport medium  Result Value Ref Range Status   SARS Coronavirus 2 by RT PCR NEGATIVE NEGATIVE Final    Comment: (NOTE) SARS-CoV-2 target nucleic acids are NOT DETECTED.  The SARS-CoV-2 RNA is generally detectable in upper respiratory specimens during the acute phase of infection. The lowest concentration of SARS-CoV-2 viral copies this assay can detect is 138 copies/mL. A negative result does not preclude SARS-Cov-2 infection and should not be used as the sole basis for treatment or other patient management decisions. A negative result may occur with  improper specimen collection/handling, submission of specimen other than nasopharyngeal swab, presence  of viral mutation(s) within the areas targeted by this assay, and inadequate number of viral copies(<138 copies/mL). A negative result must be combined with clinical observations, patient history, and epidemiological information. The expected result is Negative.  Fact Sheet for Patients:  EntrepreneurPulse.com.au  Fact Sheet for Healthcare Providers:  IncredibleEmployment.be  This test is no t yet approved or cleared by the [place] FDA and  has been authorized for detection and/or diagnosis of SARS-CoV-2 by FDA under an Emergency Use Authorization (EUA). This EUA will remain  in effect (meaning this test can be used) for the duration of the COVID-19 declaration under Section 564(b)(1) of the Act, 21 U.S.C.section 360bbb-3(b)(1), unless the authorization is terminated  or revoked sooner.       Influenza A by PCR NEGATIVE NEGATIVE Final   Influenza B by PCR NEGATIVE NEGATIVE Final    Comment: (NOTE) The Xpert Xpress SARS-CoV-2/FLU/RSV plus assay is intended as an aid in the diagnosis of influenza from Nasopharyngeal swab specimens and should not be used as a sole basis for treatment. Nasal washings and aspirates are unacceptable for Xpert Xpress SARS-CoV-2/FLU/RSV testing.  Fact Sheet for Patients: EntrepreneurPulse.com.au  Fact Sheet for Healthcare Providers: IncredibleEmployment.be  This test is not yet approved or cleared by the [place] FDA and has been authorized for detection and/or diagnosis of SARS-CoV-2 by FDA under an Emergency Use Authorization (EUA). This EUA will remain in effect (meaning this test can be used) for the duration of the COVID-19 declaration under Section 564(b)(1) of the Act, 21 U.S.C. section 360bbb-3(b)(1), unless the authorization is terminated or revoked.  Performed at [facility], [address] [place], [place] [address]     Radiologic  Imaging: CT Renal Stone Study  Result Date: 07/15/2020 CLINICAL DATA:  RIGHT flank pain, UTI, back pain, symptoms for 3 days, some blood in urine EXAM: CT ABDOMEN AND PELVIS WITHOUT CONTRAST TECHNIQUE: Multidetector CT imaging of the abdomen and pelvis was performed following the standard protocol without IV contrast. Sagittal and coronal MPR images reconstructed from axial data set. No oral contrast administered. COMPARISON:  11/27/2019 FINDINGS: Lower chest: Mild RIGHT basilar atelectasis Hepatobiliary: Gallbladder surgically absent.  Liver unremarkable. Pancreas: Normal appearance Spleen: Normal appearance Adrenals/Urinary Tract: Adrenal glands, LEFT kidney and LEFT ureter normal appearance. RIGHT kidney enlarged with perinephric edema. No RIGHT renal mass. No RIGHT hydronephrosis or hydroureter. No definite urinary tract calcification visualized. Bladder unremarkable. A new tiny calcification is seen in the RIGHT hemipelvis however, near the expected position of the distal RIGHT ureter, potentially phlebolith though a tiny nonobstructing distal RIGHT ureteral calculus is not excluded (image 74). Stomach/Bowel: Normal appendix. Stomach  and bowel loops normal appearance. Vascular/Lymphatic: Atherosclerotic calcification aorta without aneurysm. No adenopathy. Few scattered normal size retroperitoneal lymph nodes. Scattered pelvic phleboliths. Reproductive: IUD within uterus. Otherwise unremarkable uterus and adnexa Other: No free air or free fluid.  No hernia. Musculoskeletal: Unremarkable IMPRESSION: Enlargement of RIGHT kidney with significant perinephric edema though without associated RIGHT hydronephrosis or hydroureter. This could represent pyelonephritis, recommend correlation with urinalysis. Single tiny new calcification RIGHT hemipelvis, near the expected position of the RIGHT ureter though lacking ureteral dilatation or hydronephrosis, question phlebolith versus tiny nonobstructing calculus. Aortic  Atherosclerosis (ICD10-I70.0). Electronically Signed   By: Lavonia Dana M.D.   On: 07/15/2020 11:11    Assessment & Plan:  45 year old female with a history of uncontrolled diabetes and nephrolithiasis who presented to the ED with signs and symptoms and imaging findings consistent with right pyelonephritis.  CT also with an indeterminate right hemipelvic calcification representing phlebolith versus small right UVJ stone.  In the absence of hydroureteronephrosis, no intervention indicated for management of possible right UVJ stone.  Agree with empiric antibiotics and supportive care.  Thank you for involving me in this patient's care, please page with any further questions or concerns.  Debroah Loop, PA-C 07/15/2020 4:12 PM

## 2020-07-16 LAB — CBC
HCT: 30.3 % — ABNORMAL LOW (ref 36.0–46.0)
Hemoglobin: 10 g/dL — ABNORMAL LOW (ref 12.0–15.0)
MCH: 25.6 pg — ABNORMAL LOW (ref 26.0–34.0)
MCHC: 33 g/dL (ref 30.0–36.0)
MCV: 77.5 fL — ABNORMAL LOW (ref 80.0–100.0)
Platelets: 229 10*3/uL (ref 150–400)
RBC: 3.91 MIL/uL (ref 3.87–5.11)
RDW: 14.6 % (ref 11.5–15.5)
WBC: 11.7 10*3/uL — ABNORMAL HIGH (ref 4.0–10.5)
nRBC: 0 % (ref 0.0–0.2)

## 2020-07-16 LAB — GLUCOSE, CAPILLARY
Glucose-Capillary: 172 mg/dL — ABNORMAL HIGH (ref 70–99)
Glucose-Capillary: 285 mg/dL — ABNORMAL HIGH (ref 70–99)
Glucose-Capillary: 261 mg/dL — ABNORMAL HIGH (ref 70–99)
Glucose-Capillary: 104 mg/dL — ABNORMAL HIGH (ref 70–99)

## 2020-07-16 LAB — BASIC METABOLIC PANEL
Anion gap: 8 (ref 5–15)
BUN: 14 mg/dL (ref 6–20)
CO2: 27 mmol/L (ref 22–32)
Calcium: 8 mg/dL — ABNORMAL LOW (ref 8.9–10.3)
Chloride: 98 mmol/L (ref 98–111)
Creatinine, Ser: 0.79 mg/dL (ref 0.44–1.00)
GFR, Estimated: >60 mL/min (ref >60)
Glucose, Bld: 181 mg/dL — ABNORMAL HIGH (ref 70–99)
Potassium: 3.4 mmol/L — ABNORMAL LOW (ref 3.5–5.1)
Sodium: 133 mmol/L — ABNORMAL LOW (ref 135–145)

## 2020-07-16 MED ORDER — INSULIN DETEMIR 100 UNIT/ML ~~LOC~~ SOLN
40.0000 [IU] | Freq: Every day | SUBCUTANEOUS | Status: DC
  Administered 2020-07-16 – 2020-07-18 (×3): 40 [IU] via SUBCUTANEOUS
  Filled 2020-07-16 (×3): qty 0.4

## 2020-07-16 MED ORDER — GLUCERNA SHAKE PO LIQD
237.0000 mL | Freq: Three times a day (TID) | ORAL | Status: DC
  Administered 2020-07-16 – 2020-07-18 (×5): 237 mL via ORAL

## 2020-07-16 MED ORDER — POTASSIUM CHLORIDE CRYS ER 20 MEQ PO TBCR
20.0000 meq | EXTENDED_RELEASE_TABLET | Freq: Once | ORAL | Status: AC
  Administered 2020-07-16: 20 meq via ORAL
  Filled 2020-07-16: qty 1

## 2020-07-16 MED ORDER — MORPHINE SULFATE (PF) 2 MG/ML IV SOLN
2.0000 mg | INTRAVENOUS | Status: DC | PRN
Start: 2020-07-16 — End: 2020-07-17

## 2020-07-16 MED ORDER — PANTOPRAZOLE SODIUM 40 MG PO TBEC
40.0000 mg | DELAYED_RELEASE_TABLET | Freq: Every day | ORAL | Status: DC
  Administered 2020-07-16 – 2020-07-18 (×2): 40 mg via ORAL
  Filled 2020-07-16 (×2): qty 1

## 2020-07-16 MED ORDER — PROSOURCE PLUS PO LIQD
30.0000 mL | Freq: Two times a day (BID) | ORAL | Status: DC
  Administered 2020-07-16: 30 mL via ORAL

## 2020-07-16 MED ORDER — IBUPROFEN 400 MG PO TABS
400.0000 mg | ORAL_TABLET | Freq: Four times a day (QID) | ORAL | Status: DC | PRN
  Administered 2020-07-16 – 2020-07-18 (×3): 400 mg via ORAL
  Filled 2020-07-16 (×3): qty 1

## 2020-07-16 MED ORDER — SODIUM CHLORIDE 0.9 % IV SOLN
1.0000 g | INTRAVENOUS | Status: DC
  Administered 2020-07-16 – 2020-07-17 (×2): 1 g via INTRAVENOUS
  Filled 2020-07-16 (×3): qty 10

## 2020-07-16 NOTE — Progress Notes (Signed)
Inpatient Diabetes Program Recommendations  AACE/ADA: New Consensus Statement on Inpatient Glycemic Control (2015)  Target Ranges:  Prepandial:   less than 140 mg/dL      Peak postprandial:   less than 180 mg/dL (1-2 hours)      Critically ill patients:  140 - 180 mg/dL   Lab Results  Component Value Date   GLUCAP 285 (H) 07/16/2020   HGBA1C 7.5 (H) 07/15/2020    Review of Glycemic Control Results for [name], Rose DEAN (MRN [id number]) as of 07/16/2020 13:38  Ref. Range 07/15/2020 16:48 07/15/2020 22:03 07/16/2020 08:13 07/16/2020 12:05  Glucose-Capillary Latest Ref Range: 70 - 99 mg/dL 170 (H) 221 (H) 172 (H) 285 (H)   Diabetes history: type 2 Dm Outpatient Diabetes medications: Levemir 40 units QAM, metformin 1000 mg QD Current orders for Inpatient glycemic control: Novolog 0-15 units TID & HS  Inpatient Diabetes Program Recommendations:    Consider changing diet to carb modified and adding Levemir 20 units QHS.   Thanks, Bronson Curb, MSN, RNC-OB Diabetes Coordinator [phone] (8a-5p)

## 2020-07-16 NOTE — Progress Notes (Signed)
   07/16/20 1602  Assess: MEWS Score  Temp 99.4 F (37.4 C)  BP 134/87  Pulse Rate (!) 131  Resp (!) 28  Level of Consciousness Alert  SpO2 96 %  O2 Device Room Air  Assess: MEWS Score  MEWS Temp 0  MEWS Systolic 0  MEWS Pulse 3  MEWS RR 2  MEWS LOC 0  MEWS Score 5  MEWS Score Color Red  Assess: if the MEWS score is Yellow or Red  Were vital signs taken at a resting state? Yes  Focused Assessment No change from prior assessment  Does the patient meet 2 or more of the SIRS criteria? Yes  Does the patient have a confirmed or suspected source of infection? Yes  Provider and Rapid Response Notified? Yes  MEWS guidelines implemented *See Row Information* Yes  Treat  MEWS Interventions Administered prn meds/treatments;Escalated (See documentation below)  Take Vital Signs  Increase Vital Sign Frequency  Red: Q 1hr X 4 then Q 4hr X 4, if remains red, continue Q 4hrs  Escalate  MEWS: Escalate Red: discuss with charge nurse/RN and provider, consider discussing with RRT  Notify: Charge Nurse/RN  Name of Charge Nurse/RN Notified Tam Summers,RN  Date Charge Nurse/RN Notified 07/16/20  Time Charge Nurse/RN Notified 1603  Notify: Provider  Provider Name/Title Dr. Posey Pronto  Date Provider Notified 07/16/20  Time Provider Notified [phone]  Notification Type Page  Notification Reason Other (Comment) (red mews)  Provider response No new orders  Date of Provider Response 07/16/20  Time of Provider Response 1608  Notify: Rapid Response  Name of Rapid Response RN Notified Megan,RN  Date Rapid Response Notified 07/16/20  Time Rapid Response Notified [id number]  Document  Patient Outcome Stabilized after interventions  Assess: SIRS CRITERIA  SIRS Temperature  0  SIRS Pulse 1  SIRS Respirations  1  SIRS WBC 0  SIRS Score Sum  2

## 2020-07-16 NOTE — Progress Notes (Signed)
[place] [facility] [place] NAME: Rose [name]    MR#:  [id number]  DATE OF BIRTH:  [birth year]  SUBJECTIVE:   came in after having nausea vomiting and abdominal pain. Patient has known history of kidney and ureteral stones. Pain is a little bit better. Tolerating PO diet.  REVIEW OF SYSTEMS:   Review of Systems  Constitutional:  Negative for chills, fever and weight loss.  HENT:  Negative for ear discharge, ear pain and nosebleeds.   Eyes:  Negative for blurred vision, pain and discharge.  Respiratory:  Negative for sputum production, shortness of breath, wheezing and stridor.   Cardiovascular:  Negative for chest pain, palpitations, orthopnea and PND.  Gastrointestinal:  Positive for abdominal pain and nausea. Negative for diarrhea and vomiting.  Genitourinary:  Negative for frequency and urgency.  Musculoskeletal:  Negative for back pain and joint pain.  Neurological:  Negative for sensory change, speech change, focal weakness and weakness.  Psychiatric/Behavioral:  Negative for depression and hallucinations. The patient is not nervous/anxious.   Tolerating Diet:yes Tolerating PT: ambulatory  DRUG ALLERGIES:   Allergies  Allergen Reactions   Bactrim [Sulfamethoxazole-Trimethoprim] Swelling   Neurontin [Gabapentin] Itching    VITALS:  Blood pressure 108/69, pulse (!) 121, temperature (!) 100.8 F (38.2 C), temperature source Oral, resp. rate 20, height 5\' 4"  (1.626 m), weight 80.2 kg, SpO2 97 %.  PHYSICAL EXAMINATION:   Physical Exam  GENERAL:  45 y.o.-year-old patient lying in the bed with no acute distress.  LUNGS: Normal breath sounds bilaterally, no wheezing, rales, rhonchi. No use of accessory muscles of respiration.  CARDIOVASCULAR: S1, S2 normal. No murmurs, rubs, or gallops.  ABDOMEN: Soft, nontender, nondistended. Bowel sounds present. No organomegaly or mass.  EXTREMITIES: No cyanosis, clubbing or edema b/l.    NEUROLOGIC:  Cranial nerves II through XII are intact. No focal Motor or sensory deficits b/l.   PSYCHIATRIC:  patient is alert and oriented x 3.  SKIN: No obvious rash, lesion, or ulcer.   LABORATORY PANEL:  CBC Recent Labs  Lab 07/16/20 0655  WBC 11.7*  HGB 10.0*  HCT 30.3*  PLT 229    Chemistries  Recent Labs  Lab 07/15/20 1100 07/16/20 0655  NA 134* 133*  K 3.5 3.4*  CL 97* 98  CO2 29 27  GLUCOSE 275* 181*  BUN 17 14  CREATININE 1.03* 0.79  CALCIUM 8.4* 8.0*  AST 18  --   ALT 12  --   ALKPHOS 71  --   BILITOT 0.7  --    Cardiac Enzymes No results for input(s): TROPONINI in the last 168 hours. RADIOLOGY:  CT Renal Stone Study  Result Date: 07/15/2020 CLINICAL DATA:  RIGHT flank pain, UTI, back pain, symptoms for 3 days, some blood in urine EXAM: CT ABDOMEN AND PELVIS WITHOUT CONTRAST TECHNIQUE: Multidetector CT imaging of the abdomen and pelvis was performed following the standard protocol without IV contrast. Sagittal and coronal MPR images reconstructed from axial data set. No oral contrast administered. COMPARISON:  11/27/2019 FINDINGS: Lower chest: Mild RIGHT basilar atelectasis Hepatobiliary: Gallbladder surgically absent.  Liver unremarkable. Pancreas: Normal appearance Spleen: Normal appearance Adrenals/Urinary Tract: Adrenal glands, LEFT kidney and LEFT ureter normal appearance. RIGHT kidney enlarged with perinephric edema. No RIGHT renal mass. No RIGHT hydronephrosis or hydroureter. No definite urinary tract calcification visualized. Bladder unremarkable. A new tiny calcification is seen in the RIGHT hemipelvis however, near the expected position of the distal RIGHT ureter, potentially phlebolith  though a tiny nonobstructing distal RIGHT ureteral calculus is not excluded (image 74). Stomach/Bowel: Normal appendix. Stomach and bowel loops normal appearance. Vascular/Lymphatic: Atherosclerotic calcification aorta without aneurysm. No adenopathy. Few scattered normal size  retroperitoneal lymph nodes. Scattered pelvic phleboliths. Reproductive: IUD within uterus. Otherwise unremarkable uterus and adnexa Other: No free air or free fluid.  No hernia. Musculoskeletal: Unremarkable IMPRESSION: Enlargement of RIGHT kidney with significant perinephric edema though without associated RIGHT hydronephrosis or hydroureter. This could represent pyelonephritis, recommend correlation with urinalysis. Single tiny new calcification RIGHT hemipelvis, near the expected position of the RIGHT ureter though lacking ureteral dilatation or hydronephrosis, question phlebolith versus tiny nonobstructing calculus. Aortic Atherosclerosis (ICD10-I70.0). Electronically Signed   By: Lavonia Dana M.D.   On: 07/15/2020 11:11   ASSESSMENT AND PLAN:   Rose [name] is a 45 y.o. female with medical history significant for diabetes mellitus, GERD, anxiety disorder and history of nephrolithiasis who presents to the ER for evaluation of a 3-day history of right-sided flank pain associated with shaking chills, fevers, nausea, urgency, urinary frequency and dysuria.  Renal stone CT shows enlargement of RIGHT kidney with significant perinephric edema though without associated RIGHT hydronephrosis or hydroureter. This could represent pyelonephritis, recommend correlation with urinalysis. Single tiny new calcification RIGHT hemipelvis, near the expected position of the RIGHT ureter though lacking ureteral dilatation or hydronephrosis, question phlebolith versus tiny non-obstructing calculus. Aortic Atherosclerosis.  Acute pyelonephritis. Right sided H/o nephrolithiasis and Ureterolithiasis requiring stents and lithotripsy in the past --Patient presents for evaluation of right flank pain associated with shaking chills, fever and urinary symptoms --She has significant pyuria and imaging shows right-sided pyelonephritis ---on Rocephin 1 g IV daily until urine culture results become available -- blood culture so  far negative -- urine culture more than hundred thousand colonies of E. coli -- continue aggressive IV fluid resuscitation -- patient afebrile. -- seen by urology-- no indication for any intervention. If pain continues repeat CT abdomen to rule out renal abscess     Diabetes mellitus-2, Uncontrolled, hyperglycemia Continue IV fluid hydration Glycemic control with sliding scale insulin resume Levemir    GERD Place patient on po PPI     Procedures: Family communication :none Consults :Urology CODE STATUS: full DVT Prophylaxis : Lovenox Level of care: Med-Surg Status is: Inpatient  Remains inpatient appropriate because:Inpatient level of care appropriate due to severity of illness  Dispo: The patient is from: Home              Anticipated d/c is to: Home              Patient currently is not medically stable to d/c.   Difficult to place patient No        TOTAL TIME TAKING CARE OF THIS PATIENT: 35 minutes.  >50% time spent on counselling and coordination of care  Note: This dictation was prepared with Dragon dictation along with smaller phrase technology. Any transcriptional errors that result from this process are unintentional.  Fritzi Mandes M.D    [facility]   CC: Primary care physician; Department, [facility] [name], female   DOB: Jul 29, 1975, 45 y.o.   MRN: [id number]

## 2020-07-16 NOTE — Progress Notes (Signed)
Initial Nutrition Assessment  DOCUMENTATION CODES:  Not applicable  INTERVENTION:  Add Glucerna Shake po TID, each supplement provides 220 kcal and 10 grams of protein.  Add 30 ml ProSource Plus po BID, each supplement provides 100 kcal and 15 grams of protein.    Continue MVI with minerals daily.  NUTRITION DIAGNOSIS:  Inadequate oral intake related to poor appetite, nausea as evidenced by per patient/family report.  GOAL:  Patient will meet greater than or equal to 90% of their needs  MONITOR:  PO intake, Supplement acceptance, Labs, Weight trends, I & O's  REASON FOR ASSESSMENT:  Malnutrition Screening Tool    ASSESSMENT:  45 yo female with a PMH of T2DM, GERD, anxiety, and nephrolithiasis who presents with pyelonephritis.  Spoke with pt at bedside. Pt reports poor appetite for a few months now. She just reports not being hungry and having some nausea. Some of her appetite is coming back now, but she is taking things slow. Per Epic, pt ate 100% of her breakfast this morning (grits, pudding, jello).   Pt endorses probable weight loss. Per Epic, pt's weight has remained stable for the past 7 months.  On exam, pt had no depletions.  Recommend adding Glucerna TID, ProSource Plus BID, and continuing MVI with minerals to support intake.  Medications: reviewed; Vitamin C, Vitamin D3, SSI, bedtime Novolog, MVI with minerals, Protonix, Klor-Con 20 mEq, zinc sulfate, ceftriaxone via IV, LR @ 125 ml/hr, morphine PRN (given 3 times today), Zofran PRN (given once today)  Labs: reviewed; Na 133 (L), K 3.4 (L), CBG 106-221 (H) HbA1c: 7.5% (07/2020)  NUTRITION - FOCUSED PHYSICAL EXAM: Flowsheet Row Most Recent Value  Orbital Region No depletion  Upper Arm Region No depletion  Thoracic and Lumbar Region No depletion  Buccal Region No depletion  Temple Region No depletion  Clavicle Bone Region No depletion  Clavicle and Acromion Bone Region No depletion  Scapular Bone Region No  depletion  Dorsal Hand No depletion  Patellar Region No depletion  Anterior Thigh Region No depletion  Posterior Calf Region No depletion  Edema (RD Assessment) None  Hair Reviewed  Eyes Reviewed  Mouth Reviewed  Skin Reviewed  Nails Reviewed   Diet Order:   Diet Order             Diet regular Room service appropriate? Yes; Fluid consistency: Thin  Diet effective now                  EDUCATION NEEDS:  Education needs have been addressed  Skin:  Skin Assessment: Reviewed RN Assessment  Last BM:  07/14/20  Height:  Ht Readings from Last 1 Encounters:  07/15/20 5\' 4"  (1.626 m)   Weight:  Wt Readings from Last 1 Encounters:  07/15/20 80.2 kg   BMI:  Body mass index is 30.35 kg/m.  Estimated Nutritional Needs:  Kcal:  4373-5789 Protein:  85-100 grams Fluid:  >1.75 L  Derrel Nip, RD, LDN (she/her/hers) Registered Dietitian I After-Hours/Weekend Pager # in [place]

## 2020-07-17 LAB — GLUCOSE, CAPILLARY
Glucose-Capillary: 99 mg/dL (ref 70–99)
Glucose-Capillary: 134 mg/dL — ABNORMAL HIGH (ref 70–99)
Glucose-Capillary: 102 mg/dL — ABNORMAL HIGH (ref 70–99)
Glucose-Capillary: 110 mg/dL — ABNORMAL HIGH (ref 70–99)

## 2020-07-17 LAB — URINE CULTURE: Culture: >=100000 — AB

## 2020-07-17 MED ORDER — MORPHINE SULFATE (PF) 2 MG/ML IV SOLN: 2.0000 mg | INTRAVENOUS | Status: DC | PRN

## 2020-07-17 MED ORDER — KETOROLAC TROMETHAMINE 30 MG/ML IJ SOLN
30.0000 mg | Freq: Once | INTRAMUSCULAR | Status: AC
  Administered 2020-07-17: 30 mg via INTRAVENOUS
  Filled 2020-07-17: qty 1

## 2020-07-17 MED ORDER — CEPHALEXIN 500 MG PO CAPS
500.0000 mg | ORAL_CAPSULE | Freq: Three times a day (TID) | ORAL | Status: DC
  Administered 2020-07-18 (×2): 500 mg via ORAL
  Filled 2020-07-17 (×2): qty 1

## 2020-07-17 MED ORDER — DIPHENHYDRAMINE HCL 25 MG PO CAPS
25.0000 mg | ORAL_CAPSULE | Freq: Four times a day (QID) | ORAL | Status: DC | PRN
  Administered 2020-07-17: 25 mg via ORAL
  Filled 2020-07-17: qty 1

## 2020-07-17 NOTE — Progress Notes (Signed)
[place] at [place] NAME: Rose [name]    MR#:  [id number]  DATE OF BIRTH:  01/06/1976  SUBJECTIVE:   came in after having nausea vomiting and abdominal pain. Patient has known history of kidney and ureteral stones.   C/o dry heaving and headache No fever REVIEW OF SYSTEMS:   Review of Systems  Constitutional:  Negative for chills, fever and weight loss.  HENT:  Negative for ear discharge, ear pain and nosebleeds.   Eyes:  Negative for blurred vision, pain and discharge.  Respiratory:  Negative for sputum production, shortness of breath, wheezing and stridor.   Cardiovascular:  Negative for chest pain, palpitations, orthopnea and PND.  Gastrointestinal:  Positive for abdominal pain and nausea. Negative for diarrhea and vomiting.  Genitourinary:  Negative for frequency and urgency.  Musculoskeletal:  Negative for back pain and joint pain.  Neurological:  Negative for sensory change, speech change, focal weakness and weakness.  Psychiatric/Behavioral:  Negative for depression and hallucinations. The patient is not nervous/anxious.   Tolerating Diet:yes Tolerating PT: ambulatory  DRUG ALLERGIES:   Allergies  Allergen Reactions   Bactrim [Sulfamethoxazole-Trimethoprim] Swelling   Neurontin [Gabapentin] Itching    VITALS:  Blood pressure 136/82, pulse 100, temperature (!) 100.7 F (38.2 C), temperature source Axillary, resp. rate 18, height 5\' 4"  (1.626 m), weight 80.2 kg, SpO2 98 %.  PHYSICAL EXAMINATION:   Physical Exam  GENERAL:  45 y.o.-year-old patient lying in the bed with no acute distress.  LUNGS: Normal breath sounds bilaterally, no wheezing, rales, rhonchi. No use of accessory muscles of respiration.  CARDIOVASCULAR: S1, S2 normal. No murmurs, rubs, or gallops.  ABDOMEN: Soft, nontender, nondistended. Bowel sounds present. No organomegaly or mass.  EXTREMITIES: No cyanosis, clubbing or edema b/l.    NEUROLOGIC: Cranial  nerves II through XII are intact. No focal Motor or sensory deficits b/l.   PSYCHIATRIC:  patient is alert and oriented x 3.  SKIN: No obvious rash, lesion, or ulcer.   LABORATORY PANEL:  CBC Recent Labs  Lab 07/16/20 0655  WBC 11.7*  HGB 10.0*  HCT 30.3*  PLT 229     Chemistries  Recent Labs  Lab 07/15/20 1100 07/16/20 0655  NA 134* 133*  K 3.5 3.4*  CL 97* 98  CO2 29 27  GLUCOSE 275* 181*  BUN 17 14  CREATININE 1.03* 0.79  CALCIUM 8.4* 8.0*  AST 18  --   ALT 12  --   ALKPHOS 71  --   BILITOT 0.7  --     Cardiac Enzymes No results for input(s): TROPONINI in the last 168 hours. RADIOLOGY:  No results found. ASSESSMENT AND PLAN:   Rose [name] is a 45 y.o. female with medical history significant for diabetes mellitus, GERD, anxiety disorder and history of nephrolithiasis who presents to the ER for evaluation of a 3-day history of right-sided flank pain associated with shaking chills, fevers, nausea, urgency, urinary frequency and dysuria.  Renal stone CT shows enlargement of RIGHT kidney with significant perinephric edema though without associated RIGHT hydronephrosis or hydroureter. This could represent pyelonephritis, recommend correlation with urinalysis. Single tiny new calcification RIGHT hemipelvis, near the expected position of the RIGHT ureter though lacking ureteral dilatation or hydronephrosis, question phlebolith versus tiny non-obstructing calculus. Aortic Atherosclerosis.  Acute pyelonephritis. 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 coli -- continue aggressive IV  fluid resuscitation -- patient afebrile. -- seen by urology-- no indication for any intervention. If pain continues repeat CT abdomen to rule out renal abscess     Diabetes mellitus-2, Uncontrolled, hyperglycemia Glycemic control with sliding scale insulin resume Levemir    GERD Place patient on po PPI  Headache --trial of Toradol     Procedures: Family communication :none Consults :Urology CODE STATUS: full DVT Prophylaxis : Lovenox Level of care: Med-Surg Status is: Inpatient  Remains inpatient appropriate because:Inpatient level of care appropriate due to severity of illness  Dispo: The patient is from: Home              Anticipated d/c is to: Home              Patient currently is not medically stable to d/c.   Difficult to place patient No        TOTAL TIME TAKING CARE OF THIS PATIENT: 25 minutes.  >50% time spent on counselling and coordination of care  Note: This dictation was prepared with Dragon dictation along with smaller phrase technology. Any transcriptional errors that result from this process are unintentional.  Fritzi Mandes M.D    [facility]   CC: Primary care physician; Department, [facility] [name], female   DOB: [birth year], 45 y.o.   MRN: [id number]

## 2020-07-18 LAB — GLUCOSE, CAPILLARY
Glucose-Capillary: 280 mg/dL — ABNORMAL HIGH (ref 70–99)
Glucose-Capillary: 214 mg/dL — ABNORMAL HIGH (ref 70–99)

## 2020-07-18 MED ORDER — CEPHALEXIN 500 MG PO CAPS: 500.0000 mg | ORAL_CAPSULE | Freq: Three times a day (TID) | ORAL | 0 refills | Status: AC

## 2020-07-18 NOTE — Progress Notes (Signed)
Mobility Specialist - Progress Note   07/18/20 1200  Mobility  Activity Ambulated in room  Level of Assistance Independent  Assistive Device None  Distance Ambulated (ft) 50 ft  Mobility Ambulated independently in room  Mobility Response Tolerated well  Mobility performed by Mobility specialist  $Mobility charge 1 Mobility    Post-mobility: 110 HR, 98% SpO2   Pt ambulated in room independently. No LOB. Denied SOB on RA. Denied dizziness. Does voice mild back pain and headache, medication previously given to address issue. Tolerated well. Pt returned to bed with needs in reach.    Kathee Delton Mobility Specialist 07/18/20, 12:47 PM

## 2020-07-18 NOTE — Discharge Summary (Signed)
[place] at [place] NAME: Rose Patient    MR#:  [id number]  DATE OF BIRTH:  1975-03-21  DATE OF ADMISSION:  07/15/2020 ADMITTING PHYSICIAN: Collier Bullock, MD  DATE OF DISCHARGE: 07/18/2020  PRIMARY CARE PHYSICIAN: Department, [place] DIAGNOSIS:  Pyelonephritis [N12]  DISCHARGE DIAGNOSIS:  Acute Pyelonephritis  SECONDARY DIAGNOSIS:   Past Medical History:  Diagnosis Date   ADHD    Anxiety    Diabetes mellitus without complication ([place])    Family history of adverse reaction to anesthesia    SISTER-TOO MUCH ANESTHESIA FOR C-SECTION   GERD (gastroesophageal reflux disease)    History of kidney stones    Neck pain    Right leg pain     HOSPITAL COURSE:   Rose [name] is a 45 y.o. female with medical history significant for diabetes mellitus, GERD, anxiety disorder and history of nephrolithiasis who presents to the ER for evaluation of a 3-day history of right-sided flank pain associated with shaking chills, fevers, nausea, urgency, urinary frequency and dysuria.   Renal stone CT shows enlargement of RIGHT kidney with significant perinephric edema though without associated RIGHT hydronephrosis or hydroureter. This could represent pyelonephritis, recommend correlation with urinalysis. Single tiny new calcification RIGHT hemipelvis, near the expected position of the RIGHT ureter though lacking ureteral dilatation or hydronephrosis, question phlebolith versus tiny non-obstructing calculus. Aortic Atherosclerosis.   Acute pyelonephritis. Right sided H/o nephrolithiasis and Ureterolithiasis requiring stents and lithotripsy in the past --Patient presents for evaluation of right flank pain associated with shaking chills, fever and urinary symptoms --She has significant pyuria and imaging shows right-sided pyelonephritis ---on Rocephin 1 g IV daily --change to po kelfex -- blood culture so far negative --  urine culture more than hundred thousand colonies of E. coli -- recieved aggressive IV fluid resuscitation -- patient afebrile. -- seen by urology-- no indication for any intervention. If pain continues repeat CT abdomen to rule out renal abscess. Pt to f/u urology as out pt     Diabetes mellitus-2, Uncontrolled, hyperglycemia Glycemic control with sliding scale insulin resume Levemir and metformin at d/c    GERD Place patient on po PPI   Headache --trial of Toradol  --improved  Overall better. D/c home--pt agreeable      Family communication :none Consults :Urology CODE STATUS: full DVT Prophylaxis : Lovenox Level of care: Med-Surg Status is: Inpatient     Dispo: The patient is from: Home              Anticipated d/c is to: Home              Patient currently is medically stable to d/c.              Difficult to place patient No       CONSULTS OBTAINED:  Treatment Team:  Hollice Espy, MD  DRUG ALLERGIES:   Allergies  Allergen Reactions   Bactrim [Sulfamethoxazole-Trimethoprim] Swelling   Neurontin [Gabapentin] Itching    DISCHARGE MEDICATIONS:   Allergies as of 07/18/2020       Reactions   Bactrim [sulfamethoxazole-trimethoprim] Swelling   Neurontin [gabapentin] Itching        Medication List     TAKE these medications    albuterol 108 (90 Base) MCG/ACT inhaler Commonly known as: VENTOLIN HFA Inhale 2 puffs into the lungs every 6 (six) hours as needed for wheezing or shortness of breath.   cephALEXin 500 MG capsule  Commonly known as: KEFLEX Take 1 capsule (500 mg total) by mouth every 8 (eight) hours for 4 days.   cholecalciferol 25 MCG (1000 UNIT) tablet Commonly known as: VITAMIN D3 Take 3,000 Units by mouth daily.   EPINEPHrine 0.3 mg/0.3 mL Soaj injection Commonly known as: EPI-PEN Inject into the muscle.   glucose blood test strip Commonly known as: ONE TOUCH ULTRA TEST 1 each by Other route daily. And lancets 1/day 250.00    insulin detemir 100 UNIT/ML injection Commonly known as: LEVEMIR Inject 40 Units into the skin every morning.   levonorgestrel 20 MCG/24HR IUD Commonly known as: MIRENA 1 each by Intrauterine route once.   LUBRICATING EYE DROPS OP Place 1 drop into both eyes daily as needed (dry eyes).   metFORMIN 1000 MG tablet Commonly known as: GLUCOPHAGE Take 1,000 mg by mouth 2 (two) times daily.   omeprazole 20 MG capsule Commonly known as: PRILOSEC Take 20 mg by mouth daily as needed (reflux).   ONE-A-DAY ESSENTIAL PO Take 1 tablet by mouth daily.   vitamin C 1000 MG tablet Take 2,000 mg by mouth daily.   zinc gluconate 50 MG tablet Take 50 mg by mouth daily.        If you experience worsening of your admission symptoms, develop shortness of breath, life threatening emergency, suicidal or homicidal thoughts you must seek medical attention immediately by calling 911 or calling your MD immediately  if symptoms less severe.  You Must read complete instructions/literature along with all the possible adverse reactions/side effects for all the Medicines you take and that have been prescribed to you. Take any new Medicines after you have completely understood and accept all the possible adverse reactions/side effects.   Please note  You were cared for by a hospitalist during your hospital stay. If you have any questions about your discharge medications or the care you received while you were in the hospital after you are discharged, you can call the unit and asked to speak with the hospitalist on call if the hospitalist that took care of you is not available. Once you are discharged, your primary care physician will handle any further medical issues. Please note that NO REFILLS for any discharge medications will be authorized once you are discharged, as it is imperative that you return to your primary care physician (or establish a relationship with a primary care physician if you do not have  one) for your aftercare needs so that they can reassess your need for medications and monitor your lab values. Today   SUBJECTIVE   Doing well   VITAL SIGNS:  Blood pressure 132/82, pulse 100, temperature 97.9 F (36.6 C), temperature source Oral, resp. rate 16, height 5\' 4"  (1.626 m), weight 80.2 kg, SpO2 99 %.  I/O:   Intake/Output Summary (Last 24 hours) at 07/18/2020 1308 Last data filed at 07/18/2020 1029 Gross per 24 hour  Intake 600 ml  Output --  Net 600 ml    PHYSICAL EXAMINATION:  GENERAL:  45 y.o.-year-old patient lying in the bed with no acute distress.  LUNGS: Normal breath sounds bilaterally, no wheezing, rales,rhonchi or crepitation. No use of accessory muscles of respiration.  CARDIOVASCULAR: S1, S2 normal. No murmurs, rubs, or gallops.  ABDOMEN: Soft, non-tender, non-distended. Bowel sounds present. No organomegaly or mass.  EXTREMITIES: No pedal edema, cyanosis, or clubbing.  NEUROLOGIC: Cranial nerves II through XII are intact. Muscle strength 5/5 in all extremities. Sensation intact. Gait not checked.  PSYCHIATRIC: The patient is  alert and oriented x 3.  SKIN: No obvious rash, lesion, or ulcer.   DATA REVIEW:   CBC  Recent Labs  Lab 07/16/20 0655  WBC 11.7*  HGB 10.0*  HCT 30.3*  PLT 229    Chemistries  Recent Labs  Lab 07/15/20 1100 07/16/20 0655  NA 134* 133*  K 3.5 3.4*  CL 97* 98  CO2 29 27  GLUCOSE 275* 181*  BUN 17 14  CREATININE 1.03* 0.79  CALCIUM 8.4* 8.0*  AST 18  --   ALT 12  --   ALKPHOS 71  --   BILITOT 0.7  --     Microbiology Results   Recent Results (from the past 240 hour(s))  Urine culture     Status: Abnormal   Collection Time: 07/15/20  9:47 AM   Specimen: Urine, Random  Result Value Ref Range Status   Specimen Description   Final    URINE, RANDOM Performed at [facility], [address] [place], [place] [address]    Special Requests   Final    NONE Performed at [facility], [address] [place], [place] [address]    Culture >=100,000 COLONIES/mL ESCHERICHIA COLI (A)  Final   Report Status 07/17/2020 FINAL  Final   Organism ID, Bacteria ESCHERICHIA COLI (A)  Final      Susceptibility   Escherichia coli - MIC*    AMPICILLIN <=2 SENSITIVE Sensitive     CEFAZOLIN <=4 SENSITIVE Sensitive     CEFEPIME <=0.12 SENSITIVE Sensitive     CEFTRIAXONE <=0.25 SENSITIVE Sensitive     CIPROFLOXACIN <=0.25 SENSITIVE Sensitive     GENTAMICIN <=1 SENSITIVE Sensitive     IMIPENEM <=0.25 SENSITIVE Sensitive     NITROFURANTOIN <=16 SENSITIVE Sensitive     TRIMETH/SULFA <=20 SENSITIVE Sensitive     AMPICILLIN/SULBACTAM <=2 SENSITIVE Sensitive     PIP/TAZO <=4 SENSITIVE Sensitive     * >=100,000 COLONIES/mL ESCHERICHIA COLI  Resp Panel by RT-PCR (Flu A&B, Covid) Nasopharyngeal Swab     Status: None   Collection Time: 07/15/20  1:50 PM   Specimen: Nasopharyngeal Swab; Nasopharyngeal(NP) swabs in vial transport medium  Result Value Ref Range Status   SARS Coronavirus 2 by RT PCR NEGATIVE NEGATIVE Final    Comment: (NOTE) SARS-CoV-2 target nucleic acids are NOT DETECTED.  The SARS-CoV-2 RNA is generally detectable in upper respiratory specimens during the acute phase of infection. The lowest concentration of SARS-CoV-2 viral copies this assay can detect is 138 copies/mL. A negative result does not preclude SARS-Cov-2 infection and should not be used as the sole basis for treatment or other patient management decisions. A negative result may occur with  improper specimen collection/handling, submission of specimen other than nasopharyngeal swab, presence of viral mutation(s) within the areas targeted by this assay, and inadequate number of viral copies(<138 copies/mL). A negative result must be combined with clinical observations, patient history, and epidemiological information. The expected result is Negative.  Fact Sheet for Patients:   EntrepreneurPulse.com.au  Fact Sheet for Healthcare Providers:  IncredibleEmployment.be  This test is no t yet approved or cleared by the [place] FDA and  has been authorized for detection and/or diagnosis of SARS-CoV-2 by FDA under an Emergency Use Authorization (EUA). This EUA will remain  in effect (meaning this test can be used) for the duration of the COVID-19 declaration under Section 564(b)(1) of the Act, 21 U.S.C.section 360bbb-3(b)(1), unless the authorization is terminated  or revoked sooner.  Influenza A by PCR NEGATIVE NEGATIVE Final   Influenza B by PCR NEGATIVE NEGATIVE Final    Comment: (NOTE) The Xpert Xpress SARS-CoV-2/FLU/RSV plus assay is intended as an aid in the diagnosis of influenza from Nasopharyngeal swab specimens and should not be used as a sole basis for treatment. Nasal washings and aspirates are unacceptable for Xpert Xpress SARS-CoV-2/FLU/RSV testing.  Fact Sheet for Patients: EntrepreneurPulse.com.au  Fact Sheet for Healthcare Providers: IncredibleEmployment.be  This test is not yet approved or cleared by the [place] FDA and has been authorized for detection and/or diagnosis of SARS-CoV-2 by FDA under an Emergency Use Authorization (EUA). This EUA will remain in effect (meaning this test can be used) for the duration of the COVID-19 declaration under Section 564(b)(1) of the Act, 21 U.S.C. section 360bbb-3(b)(1), unless the authorization is terminated or revoked.  Performed at [facility], [address] [place], [place] [address]     RADIOLOGY:  No results found.   CODE STATUS:     Code Status Orders  (From admission, onward)           Start     Ordered   07/15/20 1459  Full code  Continuous        07/15/20 1503           Code Status History     This patient has a current code status but no historical code status.         TOTAL TIME TAKING CARE OF THIS PATIENT: 35 minutes.    Fritzi Mandes M.D  [facility]    CC: Primary care physician; Department, [facility]

## 2020-08-02 ENCOUNTER — Emergency Department: Payer: Self-pay

## 2020-08-02 ENCOUNTER — Encounter: Payer: Self-pay | Admitting: Emergency Medicine

## 2020-08-02 ENCOUNTER — Emergency Department
Admission: EM | Admit: 2020-08-02 | Discharge: 2020-08-02 | Disposition: A | Discharge status: Home / Self Care | Payer: Self-pay | Attending: Emergency Medicine | Admitting: Emergency Medicine

## 2020-08-02 DIAGNOSIS — B349 Viral infection, unspecified: Secondary | ICD-10-CM

## 2020-08-02 DIAGNOSIS — Z7984 Long term (current) use of oral hypoglycemic drugs: Secondary | ICD-10-CM | POA: Insufficient documentation

## 2020-08-02 DIAGNOSIS — U071 COVID-19: Secondary | ICD-10-CM

## 2020-08-02 DIAGNOSIS — R531 Weakness: Secondary | ICD-10-CM

## 2020-08-02 DIAGNOSIS — J45909 Unspecified asthma, uncomplicated: Secondary | ICD-10-CM | POA: Insufficient documentation

## 2020-08-02 DIAGNOSIS — E119 Type 2 diabetes mellitus without complications: Secondary | ICD-10-CM | POA: Insufficient documentation

## 2020-08-02 DIAGNOSIS — Z794 Long term (current) use of insulin: Secondary | ICD-10-CM | POA: Insufficient documentation

## 2020-08-02 DIAGNOSIS — R109 Unspecified abdominal pain: Secondary | ICD-10-CM | POA: Insufficient documentation

## 2020-08-02 DIAGNOSIS — Z87891 Personal history of nicotine dependence: Secondary | ICD-10-CM | POA: Insufficient documentation

## 2020-08-02 DIAGNOSIS — K219 Gastro-esophageal reflux disease without esophagitis: Secondary | ICD-10-CM | POA: Insufficient documentation

## 2020-08-02 LAB — URINALYSIS, COMPLETE (UACMP) WITH MICROSCOPIC
Bacteria, UA: NONE SEEN
Bilirubin Urine: NEGATIVE
Glucose, UA: NEGATIVE mg/dL
Hgb urine dipstick: NEGATIVE
Ketones, ur: NEGATIVE mg/dL
Nitrite: NEGATIVE
Protein, ur: 30 mg/dL — AB
Specific Gravity, Urine: 1.02 (ref 1.005–1.030)
pH: 7 (ref 5.0–8.0)

## 2020-08-02 LAB — CBC WITH DIFFERENTIAL/PLATELET
Abs Immature Granulocytes: 0.02 10*3/uL (ref 0.00–0.07)
Basophils Absolute: 0.1 10*3/uL (ref 0.0–0.1)
Basophils Relative: 1 %
Eosinophils Absolute: 0 10*3/uL (ref 0.0–0.5)
Eosinophils Relative: 1 %
HCT: 34.3 % — ABNORMAL LOW (ref 36.0–46.0)
Hemoglobin: 11.1 g/dL — ABNORMAL LOW (ref 12.0–15.0)
Immature Granulocytes: 0 %
Lymphocytes Relative: 14 %
Lymphs Abs: 0.7 10*3/uL (ref 0.7–4.0)
MCH: 25.2 pg — ABNORMAL LOW (ref 26.0–34.0)
MCHC: 32.4 g/dL (ref 30.0–36.0)
MCV: 78 fL — ABNORMAL LOW (ref 80.0–100.0)
Monocytes Absolute: 0.7 10*3/uL (ref 0.1–1.0)
Monocytes Relative: 15 %
Neutro Abs: 3.4 10*3/uL (ref 1.7–7.7)
Neutrophils Relative %: 69 %
Platelets: 394 10*3/uL (ref 150–400)
RBC: 4.4 MIL/uL (ref 3.87–5.11)
RDW: 15.1 % (ref 11.5–15.5)
WBC: 4.9 10*3/uL (ref 4.0–10.5)
nRBC: 0 % (ref 0.0–0.2)

## 2020-08-02 LAB — COMPREHENSIVE METABOLIC PANEL
ALT: 24 U/L (ref 0–44)
AST: 26 U/L (ref 15–41)
Albumin: 3.5 g/dL (ref 3.5–5.0)
Alkaline Phosphatase: 65 U/L (ref 38–126)
Anion gap: 8 (ref 5–15)
BUN: 10 mg/dL (ref 6–20)
CO2: 27 mmol/L (ref 22–32)
Calcium: 9 mg/dL (ref 8.9–10.3)
Chloride: 100 mmol/L (ref 98–111)
Creatinine, Ser: 0.7 mg/dL (ref 0.44–1.00)
GFR, Estimated: >60 mL/min (ref >60)
Glucose, Bld: 215 mg/dL — ABNORMAL HIGH (ref 70–99)
Potassium: 4 mmol/L (ref 3.5–5.1)
Sodium: 135 mmol/L (ref 135–145)
Total Bilirubin: 0.5 mg/dL (ref 0.3–1.2)
Total Protein: 7.9 g/dL (ref 6.5–8.1)

## 2020-08-02 LAB — RESP PANEL BY RT-PCR (FLU A&B, COVID) ARPGX2
Influenza A by PCR: NEGATIVE
Influenza B by PCR: NEGATIVE
SARS Coronavirus 2 by RT PCR: POSITIVE — AB

## 2020-08-02 LAB — LACTIC ACID, PLASMA: Lactic Acid, Venous: 1.6 mmol/L (ref 0.5–1.9)

## 2020-08-02 MED ORDER — ONDANSETRON HCL 4 MG/2ML IJ SOLN
4.0000 mg | Freq: Once | INTRAMUSCULAR | Status: AC
  Administered 2020-08-02: 4 mg via INTRAVENOUS
  Filled 2020-08-02: qty 2

## 2020-08-02 MED ORDER — ACETAMINOPHEN 500 MG PO TABS
1000.0000 mg | ORAL_TABLET | Freq: Once | ORAL | Status: AC
  Administered 2020-08-02: 1000 mg via ORAL
  Filled 2020-08-02: qty 2

## 2020-08-02 MED ORDER — KETOROLAC TROMETHAMINE 30 MG/ML IJ SOLN
15.0000 mg | Freq: Once | INTRAMUSCULAR | Status: AC
  Administered 2020-08-02: 15 mg via INTRAVENOUS
  Filled 2020-08-02: qty 1

## 2020-08-02 MED ORDER — NIRMATRELVIR/RITONAVIR (PAXLOVID)TABLET: 3.0000 | ORAL_TABLET | Freq: Two times a day (BID) | ORAL | 0 refills | Status: AC

## 2020-08-02 MED ORDER — LACTATED RINGERS IV BOLUS
1000.0000 mL | Freq: Once | INTRAVENOUS | Status: AC
  Administered 2020-08-02: 1000 mL via INTRAVENOUS

## 2020-08-02 MED ORDER — ONDANSETRON 4 MG PO TBDP: 4.0000 mg | ORAL_TABLET | Freq: Three times a day (TID) | ORAL | 0 refills | Status: DC | PRN

## 2020-08-02 NOTE — ED Provider Notes (Signed)
[facility] Emergency Department Provider Note ____________________________________________   Event Date/Time   First MD Initiated Contact with Patient 08/02/20 1107     (approximate)  I have reviewed the triage vital signs and the nursing notes.  HISTORY  Chief Complaint Weakness and Fever   HPI Rose [name] is a 45 y.o. femalewho presents to the ED for evaluation of generalized weakness, subjective fevers  Chart review indicates medical admission 2 weeks ago for pyelonephritis in the right side, treated with Rocephin to Keflex.  Further history of DM. Patient further reports receiving a short course of nitrofurantoin from her PCP that finished about 1 week ago after this admission into the possibility of residual urinary infection.  Patient reports that she did feel little bit better after all of this, but over the past 2-3 days she reports significant worsening with her presenting symptoms.  She reports generalized weakness, poor appetite subjective chills and sweats, sore throat, myalgias diffusely and minimally productive cough.  She denies any known sick contacts or COVID exposures.  She denies any urinary symptoms that she had 2 weeks ago during her pyelonephritis admission, including no dysuria, incontinence or frequency.  She reports some mild right-sided flank pain that she says is not consistent with her known history of ureterolithiasis and is more aching in nature similar to her thighs, back and neck myalgias.   Past Medical History:  Diagnosis Date   ADHD    Anxiety    Diabetes mellitus without complication ([place])    Family history of adverse reaction to anesthesia    SISTER-TOO MUCH ANESTHESIA FOR C-SECTION   GERD (gastroesophageal reflux disease)    History of kidney stones    Neck pain    Right leg pain     Patient Active Problem List   Diagnosis Date Noted   Pyelonephritis 07/15/2020   History of kidney stones    GERD  (gastroesophageal reflux disease)    Family history of high cholesterol 03/31/2017   Family history of hypertension 03/31/2017   IUD (intrauterine device) in place 03/31/2017   Hyperlipidemia 06/05/2016   Neck pain 09/19/2013   Right leg pain    Diabetes ([place]) 09/18/2013   Mood disorder ([place]) 04/20/2013   Mild intermittent asthma 12/06/2012   Diabetes mellitus type II, uncontrolled ([place]) 12/06/2012   Depression with anxiety 12/06/2012    Past Surgical History:  Procedure Laterality Date   CESAREAN SECTION     CHOLECYSTECTOMY     CYSTOSCOPY W/ RETROGRADES  06/29/2019   Procedure: CYSTOSCOPY WITH RETROGRADE PYELOGRAM;  Surgeon: Billey Co, MD;  Location: [facility] ORS;  Service: Urology;;   CYSTOSCOPY WITH RETROGRADE PYELOGRAM, URETEROSCOPY AND STENT PLACEMENT Left 01/17/2019   Procedure: CYSTOSCOPY WITH RETROGRADE PYELOGRAM, URETEROSCOPY AND STENT PLACEMENT;  Surgeon: Abbie Sons, MD;  Location: [facility];  Service: Urology;  Laterality: Left;   CYSTOSCOPY/URETEROSCOPY/HOLMIUM LASER/STENT PLACEMENT Right 06/29/2019   Procedure: CYSTOSCOPY/URETEROSCOPY/HOLMIUM LASER/STENT PLACEMENT;  Surgeon: Billey Co, MD;  Location: [facility];  Service: Urology;  Laterality: Right;   EXTRACORPOREAL SHOCK WAVE LITHOTRIPSY Right 06/01/2019   Procedure: EXTRACORPOREAL SHOCK WAVE LITHOTRIPSY (ESWL);  Surgeon: Billey Co, MD;  Location: [facility];  Service: Urology;  Laterality: Right;   kidney stone removal  01/21, 05/21, 06/21   LAPAROSCOPY N/A 08/24/2013   Procedure: LAPAROSCOPY OPERATIVE with lysis of adhesions ;  Surgeon: Marylynn Pearson, MD;  Location: [place] [facility];  Service: Gynecology;  Laterality: N/A;   STONE EXTRACTION WITH BASKET Left 01/17/2019  Procedure: STONE EXTRACTION WITH BASKET;  Surgeon: Abbie Sons, MD;  Location: [facility];  Service: Urology;  Laterality: Left;   wrist, cyst removal      Prior to Admission medications   Medication Sig Start Date End Date Taking?  Authorizing Provider  nirmatrelvir/ritonavir EUA (PAXLOVID) TABS Take 3 tablets by mouth 2 (two) times daily for 5 days. Patient GFR is >60. Take nirmatrelvir (150 mg) two tablets twice daily for 5 days and ritonavir (100 mg) one tablet twice daily for 5 days. 08/02/20 08/07/20 Yes Vladimir Crofts, MD  ondansetron (ZOFRAN ODT) 4 MG disintegrating tablet Take 1 tablet (4 mg total) by mouth every 8 (eight) hours as needed for nausea or vomiting. 08/02/20  Yes Vladimir Crofts, MD  albuterol (VENTOLIN HFA) 108 (90 Base) MCG/ACT inhaler Inhale 2 puffs into the lungs every 6 (six) hours as needed for wheezing or shortness of breath. 07/06/20   Cuthriell, Charline Bills, PA-C  Ascorbic Acid (VITAMIN C) 1000 MG tablet Take 2,000 mg by mouth daily.    [provider]  Carboxymethylcellul-Glycerin (LUBRICATING EYE DROPS OP) Place 1 drop into both eyes daily as needed (dry eyes).    [provider]  cholecalciferol (VITAMIN D3) 25 MCG (1000 UNIT) tablet Take 3,000 Units by mouth daily.    [provider]  EPINEPHrine 0.3 mg/0.3 mL IJ SOAJ injection Inject into the muscle. 10/10/19   [provider]  glucose blood (ONE TOUCH ULTRA TEST) test strip 1 each by Other route daily. And lancets 1/day 250.00 09/18/13   Renato Shin, MD  insulin detemir (LEVEMIR) 100 UNIT/ML injection Inject 40 Units into the skin every morning.  09/15/16   [provider]  levonorgestrel (MIRENA) 20 MCG/24HR IUD 1 each by Intrauterine route once.    [provider]  metFORMIN (GLUCOPHAGE) 1000 MG tablet Take 1,000 mg by mouth 2 (two) times daily.     [provider]  Multiple Vitamin (ONE-A-DAY ESSENTIAL PO) Take 1 tablet by mouth daily.    [provider]  omeprazole (PRILOSEC) 20 MG capsule Take 20 mg by mouth daily as needed (reflux).     [provider]  zinc gluconate 50 MG tablet Take 50 mg by mouth daily.    [provider]    Allergies Bactrim  [sulfamethoxazole-trimethoprim] and Neurontin [gabapentin]  Family History  Problem Relation Age of Onset   Aneurysm Mother    Blindness Father    High blood pressure Father    Hypertension Sister    Hypertension Sister    Diabetes Neg Hx     Social History Social History   Tobacco Use   Smoking status: Former    Years: 1.00    Types: Cigarettes   Smokeless tobacco: Never  Vaping Use   Vaping Use: Some days   Substances: CBD  Substance Use Topics   Alcohol use: Not Currently    Alcohol/week: 1.0 standard drink    Types: 1 Standard drinks or equivalent per week    Comment: WINE OCC   Drug use: Not Currently    Types: Marijuana    Comment: last use 05/29/19    Review of Systems  Constitutional: Positive for subjective fever/chills Eyes: No visual changes. ENT: Positive for sore throat. Cardiovascular: Denies chest pain. Respiratory: Positive for nonproductive cough and shortness of breath. Gastrointestinal: No abdominal pain.   No diarrhea.  No constipation. Positive for nausea Genitourinary: Negative for dysuria. Musculoskeletal: Positive for atraumatic myalgias diffusely Skin: Negative for rash. Neurological:  Negative for focal weakness or numbness.  Positive for aching headaches  ____________________________________________   PHYSICAL EXAM:  VITAL SIGNS: Vitals:   08/02/20 0923 08/02/20 1410  BP: (!) 140/105 127/86  Pulse: (!) 114 (!) 113  Resp: 18 18  Temp: 99.9 F (37.7 C) 99.1 F (37.3 C)  SpO2: 99% 98%     Constitutional: Alert and oriented.  Appears uncomfortable, but no acute distress.  Laying on her right side with a sweatshirt on in July Eyes: Conjunctivae are normal. PERRL. EOMI. Head: Atraumatic. Nose: No congestion/rhinnorhea. Mouth/Throat: Mucous membranes are dry.  Oropharynx non-erythematous. Neck: No stridor. No cervical spine tenderness to palpation. Cardiovascular: Tachycardic rate, regular rhythm. Grossly normal heart sounds.   Good peripheral circulation. Respiratory: Normal respiratory effort.  No retractions. Lungs CTAB. Gastrointestinal: Soft , nondistended, nontender to palpation. No CVA tenderness.  Benign abdomen. Musculoskeletal: No lower extremity tenderness nor edema.  No joint effusions. No signs of acute trauma. Neurologic:  Normal speech and language. No gross focal neurologic deficits are appreciated. No gait instability noted. Skin:  Skin is warm, dry and intact. No rash noted. Psychiatric: Mood and affect are normal. Speech and behavior are normal.  ____________________________________________   LABS (all labs ordered are listed, but only abnormal results are displayed)  Labs Reviewed  RESP PANEL BY RT-PCR (FLU A&B, COVID) ARPGX2 - Abnormal; Notable for the following components:      Result Value   SARS Coronavirus 2 by RT PCR POSITIVE (*)    All other components within normal limits  COMPREHENSIVE METABOLIC PANEL - Abnormal; Notable for the following components:   Glucose, Bld 215 (*)    All other components within normal limits  CBC WITH DIFFERENTIAL/PLATELET - Abnormal; Notable for the following components:   Hemoglobin 11.1 (*)    HCT 34.3 (*)    MCV 78.0 (*)    MCH 25.2 (*)    All other components within normal limits  URINALYSIS, COMPLETE (UACMP) WITH MICROSCOPIC - Abnormal; Notable for the following components:   Color, Urine YELLOW (*)    APPearance HAZY (*)    Protein, ur 30 (*)    Leukocytes,Ua SMALL (*)    All other components within normal limits  URINE CULTURE  LACTIC ACID, PLASMA  LACTIC ACID, PLASMA  POC URINE PREG, ED   ____________________________________________  12 Lead EKG  ____________________________________________  RADIOLOGY  ED MD interpretation: 2 view CXR reviewed by me without evidence of acute cardiopulmonary pathology.  Official radiology report(s): DG Chest 2 View  Result Date: 08/02/2020 CLINICAL DATA:  45 year old female with fever. Recent  right pyelonephritis. EXAM: CHEST - 2 VIEW COMPARISON:  Chest radiographs 07/06/2020 and earlier. FINDINGS: Mildly lower lung volumes. Mediastinal contours remain normal. Lungs appear stable and clear aside from minor atelectasis at both lung bases. No pneumothorax or pleural effusion. Visualized tracheal air column is within normal limits. No acute osseous abnormality identified. Stable cholecystectomy clips. Negative visible bowel gas pattern. IMPRESSION: Lower lung volumes.  No acute cardiopulmonary abnormality. Electronically Signed   By: Genevie Ann M.D.   On: 08/02/2020 10:00   CT Renal Stone Study  Result Date: 08/02/2020 CLINICAL DATA:  Right flank pain, recent pyelonephritis, possible distal right ureteral calculus seen on prior CT EXAM: CT ABDOMEN AND PELVIS WITHOUT CONTRAST TECHNIQUE: Multidetector CT imaging of the abdomen and pelvis was performed following the standard protocol without IV contrast. COMPARISON:  CT abdomen pelvis, 07/15/2020 FINDINGS: Lower chest: No acute abnormality. Hepatobiliary: No focal liver abnormality is seen. Status  post cholecystectomy. No biliary dilatation. Pancreas: Unremarkable. No pancreatic ductal dilatation or surrounding inflammatory changes. Spleen: Normal in size without significant abnormality. Adrenals/Urinary Tract: Adrenal glands are unremarkable. Kidneys are normal, without renal calculi, solid lesion, or hydronephrosis. Previously noted fat stranding about the right kidney is almost completely resolved. There is no hydronephrosis. There is again seen a rounded calcification in the low right hemipelvis, very near the right ureterovesicular junction measuring no greater than 3 mm (series 2, image 86). Bladder is unremarkable. Stomach/Bowel: Stomach is within normal limits. Appendix appears normal. No evidence of bowel wall thickening, distention, or inflammatory changes. Vascular/Lymphatic: No significant vascular findings are present. No enlarged abdominal or  pelvic lymph nodes. Reproductive: No mass or other significant abnormality. IUD present in the endometrial cavity. Other: No abdominal wall hernia or abnormality. No abdominopelvic ascites. Musculoskeletal: No acute or significant osseous findings. IMPRESSION: 1. Previously noted fat stranding about the right kidney is almost completely resolved. No renal calculi or hydronephrosis hydronephrosis. 2. There is again seen a rounded calcification in the low right hemipelvis, very near the right ureterovesicular junction measuring no greater than 3 mm. Strongly suspect a small phlebolith, given morphologic to similarity other small adjacent phleboliths. Electronically Signed   By: Eddie Candle M.D.   On: 08/02/2020 13:07    ____________________________________________   PROCEDURES and INTERVENTIONS  Procedure(s) performed (including Critical Care):  Procedures  Medications  lactated ringers bolus 1,000 mL (0 mLs Intravenous Stopped 08/02/20 1414)  acetaminophen (TYLENOL) tablet 1,000 mg (1,000 mg Oral Given 08/02/20 1207)  ondansetron (ZOFRAN) injection 4 mg (4 mg Intravenous Given 08/02/20 1208)  ketorolac (TORADOL) 30 MG/ML injection 15 mg (15 mg Intravenous Given 08/02/20 1209)    ____________________________________________   MDM / ED COURSE   45 year old woman 2 weeks out from pyelonephritis presents to the ED with subjective fevers and generalized weakness, most consistent with COVID-19, without evidence of sepsis or recurrent urinary infection, and amenable to outpatient management.  She presents tachycardic and uncomfortable-appearing with symptoms of a viral syndrome.  She has no urinary symptoms and monitor urinalysis has moderate leukocytes, considering her lack of symptoms, multiple rounds of antibiotics, I do not believe additional rounds of antibiotics are warranted at this time.  We will send her urine for culture.  Furthermore, no leukocytosis or lactic acidosis.  Blood work is largely  reassuring.  CXR without infiltrates or PTX.  CT renal study without urinary stones and with resolving inflammatory changes around her right kidney.  Her COVID-19 swab returns positive and her overall clinical picture and symptoms are most consistent with this.  Considering the symptoms are started in the past couple days, we will initiate a course of Paxlovid and discharged with symptomatic measures and return precautions for the ED.  Clinical Course as of 08/02/20 1733  Fri Aug 02, 2020  1130 Discussed plan of care with the patient and with him my suspicion for viral syndrome such as COVID-19 more so than recurrence of pyelonephritis and sepsis. [DS]  [id number].  Patient reports feeling little bit better.  We discussed being positive for COVID-19.  We discussed otherwise benign work-up and no indications for antibiotics at this time.  We discussed management at home with Paxlovid, antipyretics and as needed Zofran.  We discussed the possibility of rebound symptoms after Paxlovid and we discussed return precautions for the ED.  Answered questions. [DS]    Clinical Course User Index [DS] Vladimir Crofts, MD    ____________________________________________   FINAL CLINICAL IMPRESSION(S) /  ED DIAGNOSES  Final diagnoses:  COVID-19  Generalized weakness  Viral illness     ED Discharge Orders          Ordered    nirmatrelvir/ritonavir EUA (PAXLOVID) TABS  2 times daily        08/02/20 1328    ondansetron (ZOFRAN ODT) 4 MG disintegrating tablet  Every 8 hours PRN        08/02/20 1328             Zaina Jenkin   Note:  This document was prepared using Systems analyst and may include unintentional dictation errors.    Vladimir Crofts, MD 08/02/20 [phone]

## 2020-08-02 NOTE — ED Notes (Signed)
ED Provider at bedside. 

## 2020-08-02 NOTE — ED Notes (Signed)
Patient at CT

## 2020-08-02 NOTE — ED Notes (Signed)
Airborne precautions sign place on the door

## 2020-08-02 NOTE — Discharge Instructions (Addendum)
Use Tylenol for pain and fevers.  Up to 1000 mg per dose, up to 4 times per day.  Do not take more than 4000 mg of Tylenol/acetaminophen within 24 hours..  Use naproxen/Aleve for anti-inflammatory pain relief. Use up to '500mg'$  every 12 hours. Do not take more frequently than this. Do not use other NSAIDs (ibuprofen, Advil) while taking this medication. It is safe to take Tylenol with this.   Use the Paxlovid antiviral medications twice daily for the next 5 days to help with your symptoms and reduce severity of illness.  Use Zofran as needed for any further nausea and vomiting.  If you develop any further worsening symptoms despite these measures, please return to the ED.

## 2020-08-02 NOTE — ED Triage Notes (Signed)
Pt in after recent hospitalization with pyelo (R kidney). Has 99.9 temp after continued abx, and generalized weakness. Also c/o sore throat that started a few days ago, congestion and cough, emesis.

## 2020-08-04 LAB — URINE CULTURE: Culture: 40000 — AB

## 2020-08-05 NOTE — Progress Notes (Signed)
Brief pharmacy note  Pharmacist reviewed ED culture reports. Patient presented on 7/29 to ED weakness and fever. Patient denies urinary symptoms and tested positive for COVID.  Patient previously treated for pyelonephritis with Rocephin--> Keflex --> macrobid.  Urine culture resulted with 40,000c E.coli  Results discussed with Dr Corky Downs. Since patient denied symptoms for UTI provider agreed on hold treatment if patient continues to denied urinary symptoms.  Patient contacted by phone. She denies UTI symptoms or fever. Will hold any additional antibiotics.  Rose [name] PharmD, BCPS 08/05/2020 6:27 PM

## 2020-08-14 ENCOUNTER — Ambulatory Visit: Payer: BC Managed Care – PPO | Admitting: Urology

## 2020-08-22 ENCOUNTER — Encounter: Payer: Self-pay | Admitting: Urology

## 2020-08-22 ENCOUNTER — Other Ambulatory Visit: Payer: Self-pay

## 2020-08-22 ENCOUNTER — Ambulatory Visit (INDEPENDENT_AMBULATORY_CARE_PROVIDER_SITE_OTHER): Payer: Self-pay | Admitting: Urology

## 2020-08-22 ENCOUNTER — Ambulatory Visit
Admission: RE | Admit: 2020-08-22 | Discharge: 2020-08-22 | Disposition: A | Discharge status: Home / Self Care | Payer: Self-pay | Attending: Urology | Admitting: Urology

## 2020-08-22 ENCOUNTER — Ambulatory Visit
Admission: RE | Admit: 2020-08-22 | Discharge: 2020-08-22 | Disposition: A | Discharge status: Home / Self Care | Payer: Self-pay | Source: Ambulatory Visit | Attending: Urology | Admitting: Urology

## 2020-08-22 VITALS — BP 124/83 | HR 103 | Ht 64.0 in | Wt 168.0 lb

## 2020-08-22 DIAGNOSIS — N200 Calculus of kidney: Secondary | ICD-10-CM

## 2020-08-22 NOTE — Progress Notes (Signed)
   08/22/2020 2:29 PM   Rose [name] [birth year] [id number]  Reason for visit: Follow up nephrolithiasis, recent pyelonephritis/UTI  HPI: I saw Rose [name] for follow-up of the above issues.  She is a 45 year old female with a history of recurrent nephrolithiasis previously requiring left ureteroscopy in January 2021, shockwave on the right side in May 2021, and ultimately right ureteroscopy in June 2021 for residual right distal ureteral fragment.  Follow-up renal ultrasound in August 2021 showed no hydronephrosis or nephrolithiasis.  She had overall done well over the last year until a recent hospitalization in July 2022 for right-sided pyelonephritis secondary to E. coli.I personally viewed and interpreted both CT scans on 7/11 and 7/29 that showed no evidence of ureteral stones or renal stones or hydronephrosis.  I reviewed the notes from that hospitalization, including from Dr. Erlene Quan.  I also reviewed the KUB from today that shows no evidence of calcifications over the renal shadows, and multiple phleboliths consistent with prior CT.  We discussed UTI prevention strategies including cranberry tablets. We discussed general stone prevention strategies including adequate hydration with goal of producing 2.5 L of urine daily, increasing citric acid intake, increasing calcium intake during high oxalate meals, minimizing animal protein, and decreasing salt intake. Information about dietary recommendations given today.   RTC 1 year KUB prior  Billey Co, MD  [facility] [address], [place], [place] [address] [phone]

## 2020-08-22 NOTE — Patient Instructions (Signed)
Dietary Guidelines to Help Prevent Kidney Stones Kidney stones are deposits of minerals and salts that form inside your kidneys. Your risk of developing kidney stones may be greater depending on your diet, your lifestyle, the medicines you take, and whether you have certain medical conditions. Most people can lower their chances of developing kidney stones by following the instructions below. Your dietitian may give you more specific instructions depending on your overall health and the type of kidney stones you tend to develop. What are tips for following this plan? Reading food labels  Choose foods with "no salt added" or "low-salt" labels. Limit your salt (sodium) intake to less than 1,500 mg a day. Choose foods with calcium for each meal and snack. Try to eat about 300 mg of calcium at each meal. Foods that contain 200-500 mg of calcium a serving include: 8 oz (237 mL) of milk, calcium-fortifiednon-dairy milk, and calcium-fortifiedfruit juice. Calcium-fortified means that calcium has been added to these drinks. 8 oz (237 mL) of kefir, yogurt, and soy yogurt. 4 oz (114 g) of tofu. 1 oz (28 g) of cheese. 1 cup (150 g) of dried figs. 1 cup (91 g) of cooked broccoli. One 3 oz (85 g) can of sardines or mackerel. Most people need 1,000-1,500 mg of calcium a day. Talk to your dietitian about how much calcium is recommended for you. Shopping Buy plenty of fresh fruits and vegetables. Most people do not need to avoid fruits and vegetables, even if these foods contain nutrients that may contribute to kidney stones. When shopping for convenience foods, choose: Whole pieces of fruit. Pre-made salads with dressing on the side. Low-fat fruit and yogurt smoothies. Avoid buying frozen meals or prepared deli foods. These can be high in sodium. Look for foods with live cultures, such as yogurt and kefir. Choose high-fiber grains, such as whole-wheat breads, oat bran, and wheat cereals. Cooking Do not add  salt to food when cooking. Place a salt shaker on the table and allow each person to add his or her own salt to taste. Use vegetable protein, such as beans, textured vegetable protein (TVP), or tofu, instead of meat in pasta, casseroles, and soups. Meal planning Eat less salt, if told by your dietitian. To do this: Avoid eating processed or pre-made food. Avoid eating fast food. Eat less animal protein, including cheese, meat, poultry, or fish, if told by your dietitian. To do this: Limit the number of times you have meat, poultry, fish, or cheese each week. Eat a diet free of meat at least 2 days a week. Eat only one serving each day of meat, poultry, fish, or seafood. When you prepare animal protein, cut pieces into small portion sizes. For most meat and fish, one serving is about the size of the palm of your hand. Eat at least five servings of fresh fruits and vegetables each day. To do this: Keep fruits and vegetables on hand for snacks. Eat one piece of fruit or a handful of berries with breakfast. Have a salad and fruit at lunch. Have two kinds of vegetables at dinner. Limit foods that are high in a substance called oxalate. These include: Spinach (cooked), rhubarb, beets, sweet potatoes, and Swiss chard. Peanuts. Potato chips, french fries, and baked potatoes with skin on. Nuts and nut products. Chocolate. If you regularly take a diuretic medicine, make sure to eat at least 1 or 2 servings of fruits or vegetables that are high in potassium each day. These include: Avocado. Banana. Orange, prune,   carrot, or tomato juice. Baked potato. Cabbage. Beans and split peas. Lifestyle  Drink enough fluid to keep your urine pale yellow. This is the most important thing you can do. Spread your fluid intake throughout the day. If you drink alcohol: Limit how much you use to: 0-1 drink a day for women who are not pregnant. 0-2 drinks a day for men. Be aware of how much alcohol is in your  drink. In the [place] one drink equals one 12 oz bottle of beer (355 mL), one 5 oz glass of wine (148 mL), or one 1 oz glass of hard liquor (44 mL). Lose weight if told by your health care provider. Work with your dietitian to find an eating plan and weight loss strategies that work best for you. General information Talk to your health care provider and dietitian about taking daily supplements. You may be told the following depending on your health and the cause of your kidney stones: Not to take supplements with vitamin C. To take a calcium supplement. To take a daily probiotic supplement. To take other supplements such as magnesium, fish oil, or vitamin B6. Take over-the-counter and prescription medicines only as told by your health care provider. These include supplements. What foods should I limit? Limit your intake of the following foods, or eat them as told by your dietitian. Vegetables Spinach. Rhubarb. Beets. Canned vegetables. Pickles. Olives. Baked potatoes with skin. Grains Wheat bran. Baked goods. Salted crackers. Cereals high in sugar. Meats and other proteins Nuts. Nut butters. Large portions of meat, poultry, or fish. Salted, precooked, or cured meats, such as sausages, meat loaves, and hot dogs. Dairy Cheese. Beverages Regular soft drinks. Regular vegetable juice. Seasonings and condiments Seasoning blends with salt. Salad dressings. Soy sauce. Ketchup. Barbecue sauce. Other foods Canned soups. Canned pasta sauce. Casseroles. Pizza. Lasagna. Frozen meals. Potato chips. French fries. The items listed above may not be a complete list of foods and beverages you should limit. Contact a dietitian for more information. What foods should I avoid? Talk to your dietitian about specific foods you should avoid based on the type of kidney stones you have and your overall health. Fruits Grapefruit. The item listed above may not be a complete list of foods and beverages you should  avoid. Contact a dietitian for more information. Summary Kidney stones are deposits of minerals and salts that form inside your kidneys. You can lower your risk of kidney stones by making changes to your diet. The most important thing you can do is drink enough fluid. Drink enough fluid to keep your urine pale yellow. Talk to your dietitian about how much calcium you should have each day, and eat less salt and animal protein as told by your dietitian. This information is not intended to replace advice given to you by your health care provider. Make sure you discuss any questions you have with your health care provider. Document Revised: 12/15/2018 Document Reviewed: 12/15/2018 Elsevier Patient Education  2022 Elsevier Inc.  

## 2021-01-12 DIAGNOSIS — N3000 Acute cystitis without hematuria: Secondary | ICD-10-CM | POA: Diagnosis not present

## 2021-02-10 ENCOUNTER — Other Ambulatory Visit: Payer: Self-pay | Admitting: Certified Nurse Midwife

## 2021-02-10 ENCOUNTER — Other Ambulatory Visit (HOSPITAL_COMMUNITY)
Admission: RE | Admit: 2021-02-10 | Discharge: 2021-02-10 | Disposition: A | Discharge status: Home / Self Care | Payer: BC Managed Care – PPO | Source: Ambulatory Visit | Attending: Certified Nurse Midwife | Admitting: Certified Nurse Midwife

## 2021-02-10 ENCOUNTER — Ambulatory Visit (INDEPENDENT_AMBULATORY_CARE_PROVIDER_SITE_OTHER): Payer: BC Managed Care – PPO | Admitting: Certified Nurse Midwife

## 2021-02-10 ENCOUNTER — Encounter: Payer: Self-pay | Admitting: Certified Nurse Midwife

## 2021-02-10 ENCOUNTER — Other Ambulatory Visit: Payer: Self-pay

## 2021-02-10 VITALS — BP 145/91 | HR 106 | Ht 64.0 in | Wt 175.4 lb

## 2021-02-10 DIAGNOSIS — N898 Other specified noninflammatory disorders of vagina: Secondary | ICD-10-CM | POA: Diagnosis not present

## 2021-02-10 DIAGNOSIS — Z113 Encounter for screening for infections with a predominantly sexual mode of transmission: Secondary | ICD-10-CM | POA: Diagnosis not present

## 2021-02-10 DIAGNOSIS — Z01419 Encounter for gynecological examination (general) (routine) without abnormal findings: Secondary | ICD-10-CM | POA: Insufficient documentation

## 2021-02-10 DIAGNOSIS — R102 Pelvic and perineal pain: Secondary | ICD-10-CM | POA: Diagnosis not present

## 2021-02-10 DIAGNOSIS — Z1231 Encounter for screening mammogram for malignant neoplasm of breast: Secondary | ICD-10-CM | POA: Diagnosis not present

## 2021-02-10 DIAGNOSIS — Z30431 Encounter for routine checking of intrauterine contraceptive device: Secondary | ICD-10-CM | POA: Diagnosis not present

## 2021-02-10 DIAGNOSIS — R928 Other abnormal and inconclusive findings on diagnostic imaging of breast: Secondary | ICD-10-CM

## 2021-02-10 DIAGNOSIS — N6320 Unspecified lump in the left breast, unspecified quadrant: Secondary | ICD-10-CM

## 2021-02-10 NOTE — Addendum Note (Signed)
Addended by: Hildred Priest on: 02/10/2021 02:21 PM   Modules accepted: Orders

## 2021-02-10 NOTE — Progress Notes (Addendum)
GYNECOLOGY ANNUAL PREVENTATIVE CARE ENCOUNTER NOTE  History:     Rose [name] is a 46 y.o. G31P1011 female here for a routine annual gynecologic exam.  Current complaints: pelvic pain , has concerns about IUD. Request vaginal swab for screening and for vaginal discharge. Denies abnormal vaginal bleeding, discharge, pelvic pain, problems with intercourse or other gynecologic concerns.     Social Relationship: female partner Living: with 61 yr old daughter Work: Quarry manager.  Exercise: irregular Smoke/Alcohol/drug use: occasional alcohol use , denies smoking and drug use.  Gynecologic History No LMP recorded (lmp unknown). (Menstrual status: IUD). Contraception: IUD Last Pap: 06/25/18. Results were: normal with negative HPV Last mammogram: 05/24/2017. Results were: normal  Obstetric History OB History  Gravida Para Term Preterm AB Living  2 1 1   1 1   SAB IAB Ectopic Multiple Live Births  1       1    # Outcome Date GA Lbr Len/2nd Weight Sex Delivery Anes PTL Lv  2 SAB 2008          1 Term 1993    F CS-Unspec  N LIV    Past Medical History:  Diagnosis Date   ADHD    Anxiety    Diabetes mellitus without complication (HCC)    Family history of adverse reaction to anesthesia    SISTER-TOO MUCH ANESTHESIA FOR C-SECTION   GERD (gastroesophageal reflux disease)    History of kidney stones    Neck pain    Right leg pain     Past Surgical History:  Procedure Laterality Date   CESAREAN SECTION     CHOLECYSTECTOMY     CYSTOSCOPY W/ RETROGRADES  06/29/2019   Procedure: CYSTOSCOPY WITH RETROGRADE PYELOGRAM;  Surgeon: Billey Co, MD;  Location: [facility];  Service: Urology;;   [place], URETEROSCOPY AND STENT PLACEMENT Left 01/17/2019   Procedure: CYSTOSCOPY WITH RETROGRADE PYELOGRAM, URETEROSCOPY AND STENT PLACEMENT;  Surgeon: Abbie Sons, MD;  Location: [facility];  Service: Urology;  Laterality: Left;   CYSTOSCOPY/URETEROSCOPY/HOLMIUM  LASER/STENT PLACEMENT Right 06/29/2019   Procedure: CYSTOSCOPY/URETEROSCOPY/HOLMIUM LASER/STENT PLACEMENT;  Surgeon: Billey Co, MD;  Location: [facility];  Service: Urology;  Laterality: Right;   EXTRACORPOREAL SHOCK WAVE LITHOTRIPSY Right 06/01/2019   Procedure: EXTRACORPOREAL SHOCK WAVE LITHOTRIPSY (ESWL);  Surgeon: Billey Co, MD;  Location: [facility];  Service: Urology;  Laterality: Right;   kidney stone removal  01/21, 05/21, 06/21   LAPAROSCOPY N/A 08/24/2013   Procedure: LAPAROSCOPY OPERATIVE with lysis of adhesions ;  Surgeon: Marylynn Pearson, MD;  Location: [facility];  Service: Gynecology;  Laterality: N/A;   STONE EXTRACTION WITH BASKET Left 01/17/2019   Procedure: STONE EXTRACTION WITH BASKET;  Surgeon: Abbie Sons, MD;  Location: [facility];  Service: Urology;  Laterality: Left;   wrist, cyst removal      Current Outpatient Medications on File Prior to Visit  Medication Sig Dispense Refill   albuterol (VENTOLIN HFA) 108 (90 Base) MCG/ACT inhaler Inhale 2 puffs into the lungs every 6 (six) hours as needed for wheezing or shortness of breath. 8 g 2   Ascorbic Acid (VITAMIN C) 1000 MG tablet Take 2,000 mg by mouth daily.     Carboxymethylcellul-Glycerin (LUBRICATING EYE DROPS OP) Place 1 drop into both eyes daily as needed (dry eyes).     cholecalciferol (VITAMIN D3) 25 MCG (1000 UNIT) tablet Take 3,000 Units by mouth daily.     EPINEPHrine 0.3 mg/0.3 mL IJ SOAJ  injection Inject into the muscle.     glucose blood (ONE TOUCH ULTRA TEST) test strip 1 each by Other route daily. And lancets 1/day 250.00 100 each 12   insulin detemir (LEVEMIR) 100 UNIT/ML injection Inject 40 Units into the skin every morning.      levonorgestrel (MIRENA) 20 MCG/24HR IUD 1 each by Intrauterine route once.     metFORMIN (GLUCOPHAGE) 1000 MG tablet Take 1,000 mg by mouth 2 (two) times daily.      Multiple Vitamin (ONE-A-DAY ESSENTIAL PO) Take 1 tablet by mouth daily.     omeprazole (PRILOSEC) 20 MG  capsule Take 20 mg by mouth daily as needed (reflux).  (Patient not taking: Reported on 02/10/2021)     ondansetron (ZOFRAN ODT) 4 MG disintegrating tablet Take 1 tablet (4 mg total) by mouth every 8 (eight) hours as needed for nausea or vomiting. (Patient not taking: Reported on 02/10/2021) 20 tablet 0   zinc gluconate 50 MG tablet Take 50 mg by mouth daily. (Patient not taking: Reported on 02/10/2021)     No current facility-administered medications on file prior to visit.    Allergies  Allergen Reactions   Bactrim [Sulfamethoxazole-Trimethoprim] Swelling   Neurontin [Gabapentin] Itching    Social History:  reports that she has quit smoking. Her smoking use included cigarettes. She has never used smokeless tobacco. She reports that she does not currently use alcohol after a past usage of about 1.0 standard drink per week. She reports that she does not currently use drugs after having used the following drugs: Marijuana.  Family History  Problem Relation Age of Onset   Aneurysm Mother    Blindness Father    High blood pressure Father    Hypertension Sister    Hypertension Sister    Diabetes Neg Hx     The following portions of the patient's history were reviewed and updated as appropriate: allergies, current medications, past family history, past medical history, past social history, past surgical history and problem list.  Review of Systems Pertinent items noted in HPI and remainder of comprehensive ROS otherwise negative.  Physical Exam:  BP (!) 145/91    Pulse (!) 106    Ht 5\' 4"  (1.626 m)    Wt 175 lb 6.4 oz (79.6 kg)    LMP  (LMP Unknown)    BMI 30.11 kg/m  CONSTITUTIONAL: Well-developed, well-nourished female in no acute distress.  HENT:  Normocephalic, atraumatic, External right and left ear normal. Oropharynx is clear and moist EYES: Conjunctivae and EOM are normal. Pupils are equal, round, and reactive to light. No scleral icterus.  NECK: Normal range of motion, supple, no  masses.  Normal thyroid.  SKIN: Skin is warm and dry. No rash noted. Not diaphoretic. No erythema. No pallor. MUSCULOSKELETAL: Normal range of motion. No tenderness.  No cyanosis, clubbing, or edema.  2+ distal pulses. NEUROLOGIC: Alert and oriented to person, place, and time. Normal reflexes, muscle tone coordination.  PSYCHIATRIC: Normal mood and affect. Normal behavior. Normal judgment and thought content. CARDIOVASCULAR: Normal heart rate noted, regular rhythm RESPIRATORY: Clear to auscultation bilaterally. Effort and breath sounds normal, no problems with respiration noted. BREASTS: Symmetric in size. No tenderness, skin changes, nipple drainage, or lymphadenopathy bilaterally.  Left breast mass noted 11 o clock pt states she has cysts that have been previously evaluated .  ABDOMEN: Soft, no distention noted.  No tenderness, rebound or guarding.  PELVIC: Normal appearing external genitalia and urethral meatus; normal appearing vaginal mucosa and cervix.  No abnormal discharge noted.  Pap smear obtained.  Normal uterine size, no other palpable masses, no uterine or adnexal tenderness.  .   Assessment and Plan:    1. Women's annual routine gynecological examination  Pap: not due Mammogram : ordered Labs: declines has done with PCP, vaginal swab screening Refills: none Referral: none, pt state she has coloGaurd to do at home from her PCP.  Routine preventative health maintenance measures emphasized. Please refer to After Visit Summary for other counseling recommendations.      Philip Aspen, CNM [facility]

## 2021-02-10 NOTE — Patient Instructions (Signed)

## 2021-02-12 ENCOUNTER — Other Ambulatory Visit: Payer: Self-pay | Admitting: Certified Nurse Midwife

## 2021-02-12 ENCOUNTER — Encounter: Payer: Self-pay | Admitting: Certified Nurse Midwife

## 2021-02-12 LAB — CERVICOVAGINAL ANCILLARY ONLY
Bacterial Vaginitis (gardnerella): POSITIVE — AB
Candida Glabrata: NEGATIVE
Candida Vaginitis: NEGATIVE
Chlamydia: NEGATIVE
Comment: NEGATIVE
Comment: NEGATIVE
Comment: NEGATIVE
Comment: NEGATIVE
Comment: NEGATIVE
Comment: NORMAL
Neisseria Gonorrhea: NEGATIVE
Trichomonas: NEGATIVE

## 2021-02-12 MED ORDER — METRONIDAZOLE 500 MG PO TABS: 500.0000 mg | ORAL_TABLET | Freq: Two times a day (BID) | ORAL | 0 refills | Status: AC

## 2021-02-25 IMAGING — DX PORTABLE CHEST - 1 VIEW
1 series · 1 of 1 positions shown · non-contrast
Comparison: 09/15/2017

CLINICAL DATA: Chest pain and pressure for 2 days

EXAM:
PORTABLE CHEST 1 VIEW

[chest ap]
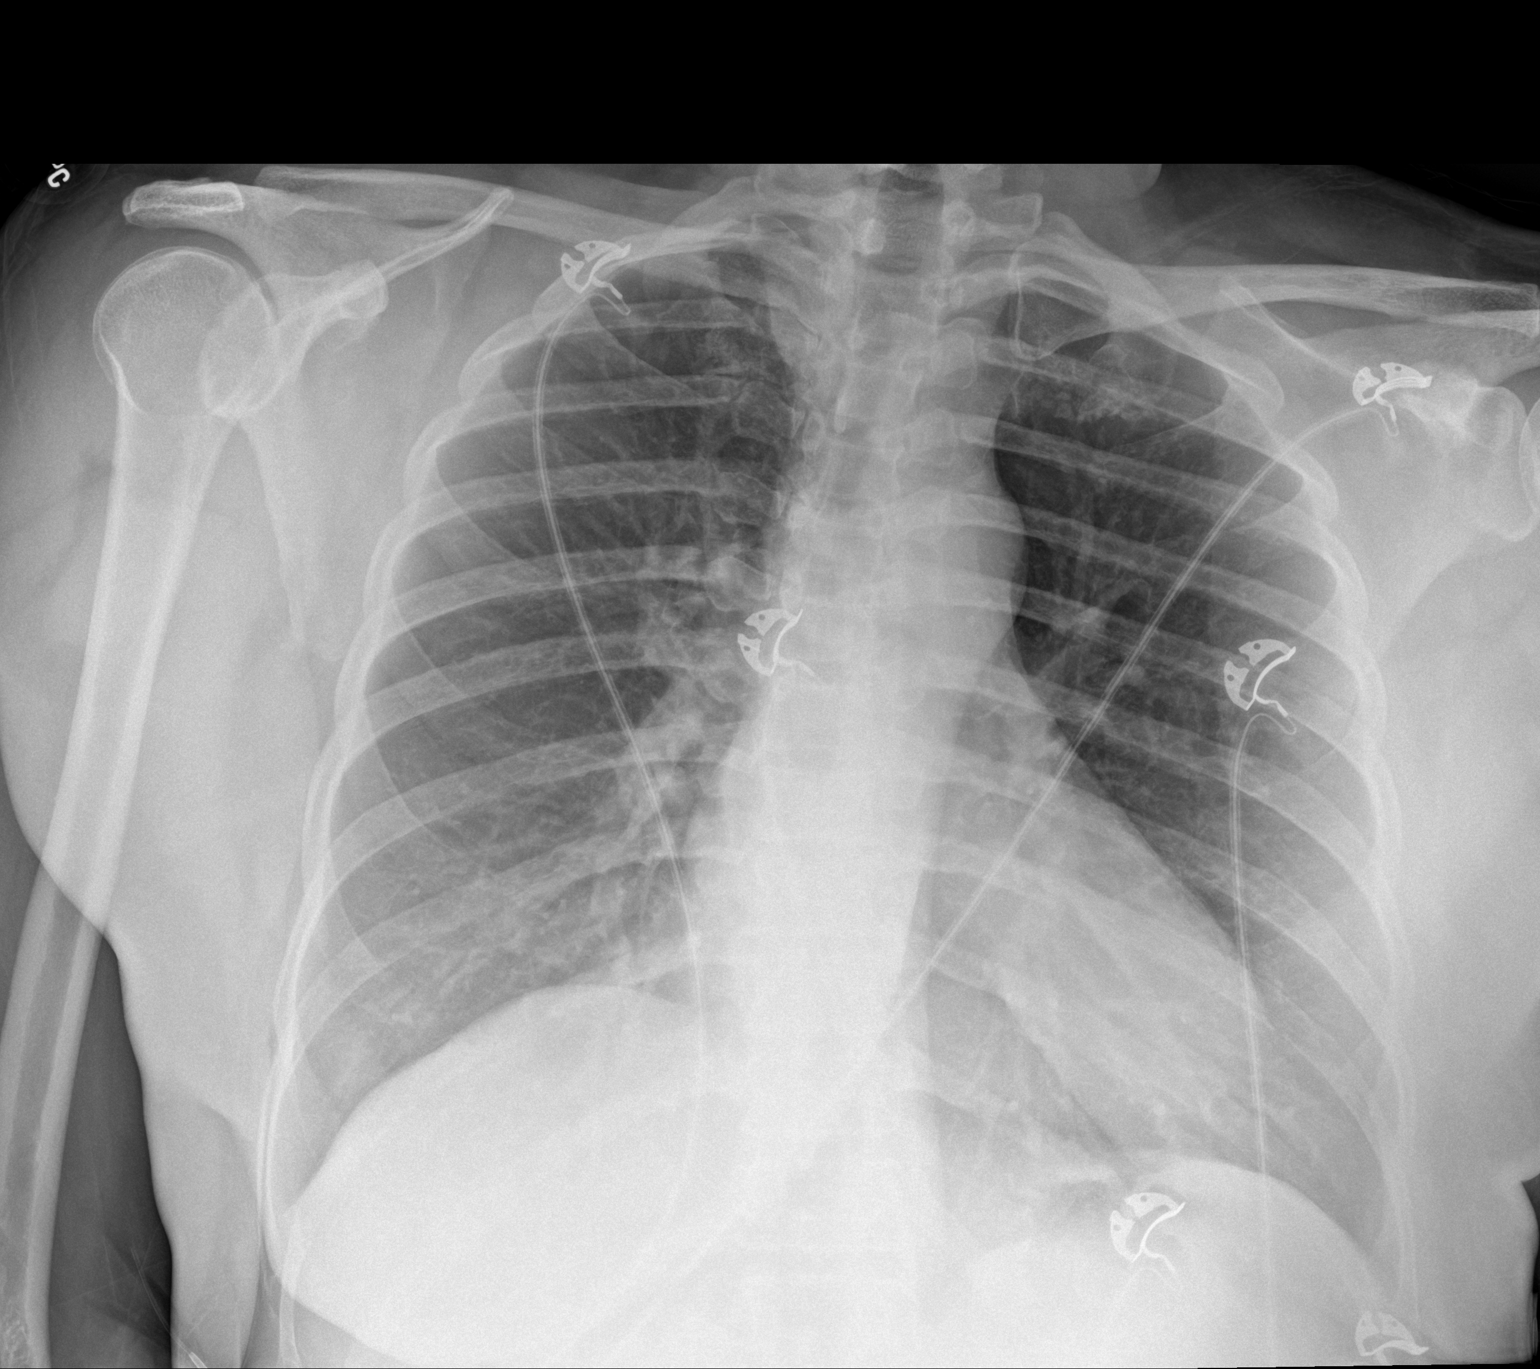

[1 of 1 positions shown; findings below may reference images not displayed]

FINDINGS: The heart size and mediastinal contours are within normal limits.
Both lungs are clear. The visualized skeletal structures are
unremarkable.
IMPRESSION: No active disease.

## 2021-02-28 ENCOUNTER — Ambulatory Visit (INDEPENDENT_AMBULATORY_CARE_PROVIDER_SITE_OTHER): Payer: BC Managed Care – PPO

## 2021-02-28 ENCOUNTER — Other Ambulatory Visit: Payer: Self-pay

## 2021-02-28 DIAGNOSIS — R102 Pelvic and perineal pain: Secondary | ICD-10-CM | POA: Diagnosis not present

## 2021-02-28 DIAGNOSIS — Z30431 Encounter for routine checking of intrauterine contraceptive device: Secondary | ICD-10-CM

## 2021-03-04 ENCOUNTER — Ambulatory Visit
Admission: RE | Admit: 2021-03-04 | Discharge: 2021-03-04 | Disposition: A | Discharge status: Home / Self Care | Payer: BC Managed Care – PPO | Source: Ambulatory Visit | Attending: Certified Nurse Midwife | Admitting: Certified Nurse Midwife

## 2021-03-04 ENCOUNTER — Other Ambulatory Visit: Payer: Self-pay

## 2021-03-04 DIAGNOSIS — R928 Other abnormal and inconclusive findings on diagnostic imaging of breast: Secondary | ICD-10-CM | POA: Diagnosis not present

## 2021-03-04 DIAGNOSIS — Z01419 Encounter for gynecological examination (general) (routine) without abnormal findings: Secondary | ICD-10-CM | POA: Insufficient documentation

## 2021-03-04 DIAGNOSIS — N6320 Unspecified lump in the left breast, unspecified quadrant: Secondary | ICD-10-CM | POA: Insufficient documentation

## 2021-03-04 DIAGNOSIS — Z1231 Encounter for screening mammogram for malignant neoplasm of breast: Secondary | ICD-10-CM | POA: Insufficient documentation

## 2021-03-04 DIAGNOSIS — R922 Inconclusive mammogram: Secondary | ICD-10-CM | POA: Diagnosis not present

## 2021-03-05 ENCOUNTER — Encounter: Payer: Self-pay | Admitting: Obstetrics

## 2021-03-25 ENCOUNTER — Ambulatory Visit: Payer: BC Managed Care – PPO | Admitting: Internal Medicine

## 2021-03-25 VITALS — BP 116/72 | HR 111 | Temp 98.6°F | Resp 16 | Ht 64.0 in | Wt 175.8 lb

## 2021-03-25 DIAGNOSIS — R0602 Shortness of breath: Secondary | ICD-10-CM | POA: Diagnosis not present

## 2021-03-25 DIAGNOSIS — J4520 Mild intermittent asthma, uncomplicated: Secondary | ICD-10-CM

## 2021-03-25 DIAGNOSIS — K219 Gastro-esophageal reflux disease without esophagitis: Secondary | ICD-10-CM

## 2021-03-25 DIAGNOSIS — E119 Type 2 diabetes mellitus without complications: Secondary | ICD-10-CM

## 2021-03-25 DIAGNOSIS — E782 Mixed hyperlipidemia: Secondary | ICD-10-CM

## 2021-03-25 MED ORDER — ALBUTEROL SULFATE HFA 108 (90 BASE) MCG/ACT IN AERS: 2.0000 | INHALATION_SPRAY | Freq: Four times a day (QID) | RESPIRATORY_TRACT | 2 refills | Status: DC | PRN

## 2021-03-25 MED ORDER — OMEPRAZOLE 20 MG PO CPDR: 20.0000 mg | DELAYED_RELEASE_CAPSULE | Freq: Every day | ORAL | 3 refills | Status: DC | PRN

## 2021-03-25 NOTE — Assessment & Plan Note (Signed)
Never formally diagnosed, uses Albuterol as needed. Referred for PFTs. ?

## 2021-03-25 NOTE — Patient Instructions (Addendum)
It was great seeing you today! ? ?Plan discussed at today's visit: ?-Blood work ordered today, results will be uploaded to [facility].  ?-Referral to lung doctor for asthma check, Albuterol sent to pharmacy ?-Prilosec refilled  ? ?Follow up in: 3 months  ? ?Take care and let us know if you have any questions or concerns prior to your next visit. ? ?Dr. Rosana Berger ? ?

## 2021-03-25 NOTE — Assessment & Plan Note (Signed)
Stable, continue current medications.  

## 2021-03-25 NOTE — Progress Notes (Signed)
New Patient Office Visit  Subjective:  [name], female    DOB: 07-20-1975  Age: 46 y.o. MRN: [id number]  CC:  Chief Complaint  Patient presents with   Establish Care   Shortness of Breath        Hearing Problem    Concerned with hearring, people tell her she needs to have checked    HPI Rose Dean Duthie presents as a new patient.   Diabetes, Type 2: -Diagnosed 2013 -Last A1c 7.5% 7/22 -Medications: Metformin 1000 BID,  Levemir 40 units in the morning -Patient is compliant with the above medications and reports no side effects.  -Checking BG at home: fasting: 130-138; lowest was 50 when she had to fast for labs  -Eye exam: scheduled later this month  -Foot exam: Due -Microalbumin: Due -Statin: No -PNA vaccine: yes, 23 in 2014 -Denies symptoms of hypoglycemia, polyuria, polydipsia, numbness extremities, foot ulcers/trauma.   GERD: -Currently on Omeprazole 20, controls symptoms   Short of Breath: -At rest and with activity -Worse when laying on right side -Albuterol helps - using about every other day -Wheezes, no cough -No PND, orthopnea   Health Maintenance: -Blood work due -Mammogram 2/23 Birads-2 -Pap UTD, has Mirena    Past Medical History:  Diagnosis Date   ADHD    Anxiety    Diabetes mellitus without complication (HCC)    Family history of adverse reaction to anesthesia    SISTER-TOO MUCH ANESTHESIA FOR C-SECTION   GERD (gastroesophageal reflux disease)    History of kidney stones    Neck pain    Right leg pain     Past Surgical History:  Procedure Laterality Date   CESAREAN SECTION     CHOLECYSTECTOMY     CYSTOSCOPY W/ RETROGRADES  06/29/2019   Procedure: CYSTOSCOPY WITH RETROGRADE PYELOGRAM;  Surgeon: Sondra Come, MD;  Location: [facility];  Service: Urology;;   CYSTOSCOPY WITH RETROGRADE PYELOGRAM, URETEROSCOPY AND STENT PLACEMENT Left 01/17/2019   Procedure: CYSTOSCOPY WITH RETROGRADE PYELOGRAM, URETEROSCOPY AND  STENT PLACEMENT;  Surgeon: Riki Altes, MD;  Location: [facility];  Service: Urology;  Laterality: Left;   CYSTOSCOPY/URETEROSCOPY/HOLMIUM LASER/STENT PLACEMENT Right 06/29/2019   Procedure: CYSTOSCOPY/URETEROSCOPY/HOLMIUM LASER/STENT PLACEMENT;  Surgeon: Sondra Come, MD;  Location: [facility];  Service: Urology;  Laterality: Right;   EXTRACORPOREAL SHOCK WAVE LITHOTRIPSY Right 06/01/2019   Procedure: EXTRACORPOREAL SHOCK WAVE LITHOTRIPSY (ESWL);  Surgeon: Sondra Come, MD;  Location: [facility];  Service: Urology;  Laterality: Right;   kidney stone removal  01/21, 05/21, 06/21   LAPAROSCOPY N/A 08/24/2013   Procedure: LAPAROSCOPY OPERATIVE with lysis of adhesions ;  Surgeon: Zelphia Cairo, MD;  Location: [facility];  Service: Gynecology;  Laterality: N/A;   STONE EXTRACTION WITH BASKET Left 01/17/2019   Procedure: STONE EXTRACTION WITH BASKET;  Surgeon: Riki Altes, MD;  Location: [facility];  Service: Urology;  Laterality: Left;   wrist, cyst removal      Family History  Problem Relation Age of Onset   Aneurysm Mother    Blindness Father    High blood pressure Father    Hypertension Sister    Hypertension Sister    Diabetes Neg Hx     Social History   Socioeconomic History   Marital status: Single    Spouse name: Not on file   Number of children: 1   Years of education: GED   Highest education level: Not on file  Occupational History    Employer: [employer]  Tobacco Use   Smoking status: Former    Years: 1.00    Types: Cigarettes   Smokeless tobacco: Never  Vaping Use   Vaping Use: Some days   Substances: CBD  Substance and Sexual Activity   Alcohol use: Not Currently    Alcohol/week: 1.0 standard drink    Types: 1 Standard drinks or equivalent per week    Comment: WINE OCC   Drug use: Not Currently    Types: Marijuana    Comment: last use 05/29/19   Sexual activity: Not Currently    Birth control/protection: I.U.D.  Other Topics Concern   Not on file   Social History Narrative   Patient is single and she takes care of her daughter. Patient works full time Engineer, civil (consulting).   Education GED.   Right handed.   Caffeine one cup of coffee daily and soda daily.   Children One       Social Determinants of Health   Financial Resource Strain: Not on file  Food Insecurity: Not on file  Transportation Needs: Not on file  Physical Activity: Not on file  Stress: Not on file  Social Connections: Not on file  Intimate Partner Violence: Not on file    ROS Review of Systems  Constitutional:  Negative for chills and fever.  Eyes:  Negative for visual disturbance.  Respiratory:  Positive for shortness of breath and wheezing. Negative for cough.   Cardiovascular:  Negative for chest pain, palpitations and leg swelling.  Gastrointestinal:  Negative for abdominal pain.   Objective:   Today's Vitals: BP 116/72   Pulse (!) 111   Temp 98.6 F (37 C)   Resp 16   Ht 5\' 4"  (1.626 m)   Wt 175 lb 12.8 oz (79.7 kg)   SpO2 99%   BMI 30.18 kg/m   Physical Exam Vitals reviewed.  Constitutional:      Appearance: She is well-developed.  HENT:     Head: Normocephalic and atraumatic.  Eyes:     Conjunctiva/sclera: Conjunctivae normal.  Cardiovascular:     Rate and Rhythm: Normal rate and regular rhythm.     Pulses:          Dorsalis pedis pulses are 2+ on the right side and 2+ on the left side.  Pulmonary:     Effort: Pulmonary effort is normal.     Breath sounds: Normal breath sounds. No wheezing, rhonchi or rales.  Musculoskeletal:     Right lower leg: No edema.     Left lower leg: No edema.     Right foot: Normal range of motion. No deformity, bunion, Charcot foot, foot drop or prominent metatarsal heads.     Left foot: Normal range of motion. No deformity, bunion, Charcot foot, foot drop or prominent metatarsal heads.  Feet:     Right foot:     Protective Sensation: 6 sites tested.  6 sites sensed.     Skin integrity: Skin integrity  normal.     Toenail Condition: Right toenails are normal.     Left foot:     Protective Sensation: 6 sites tested.  6 sites sensed.     Skin integrity: Skin integrity normal.     Toenail Condition: Left toenails are normal.  Skin:    General: Skin is warm and dry.  Neurological:     General: No focal deficit present.     Mental Status: She is alert. Mental status is at baseline.  Psychiatric:  Mood and Affect: Mood normal.        Behavior: Behavior normal.    Assessment & Plan:   Problem List Items Addressed This Visit       Respiratory   Mild intermittent asthma    Never formally diagnosed, uses Albuterol as needed. Referred for PFTs.      Relevant Medications   albuterol (VENTOLIN HFA) 108 (90 Base) MCG/ACT inhaler     Digestive   GERD (gastroesophageal reflux disease)    Stable, continue current medications.       Relevant Medications   omeprazole (PRILOSEC) 20 MG capsule     Endocrine   Diabetes (HCC) - Primary    Last A1c over the summer 7.5%. Continue current medications. A1c, labs today. Foot exam today.       Relevant Medications   omeprazole (PRILOSEC) 20 MG capsule   Other Relevant Orders   CBC w/Diff/Platelet   COMPLETE METABOLIC PANEL WITH GFR   HgB O5D   Lipid Profile   Urine Microalbumin w/creat. ratio   HM Diabetes Foot Exam (Completed)   Other Visit Diagnoses     Shortness of breath       Relevant Medications   albuterol (VENTOLIN HFA) 108 (90 Base) MCG/ACT inhaler   Other Relevant Orders   Ambulatory referral to Pulmonology       Outpatient Encounter Medications as of 03/25/2021  Medication Sig   Ascorbic Acid (VITAMIN C) 1000 MG tablet Take 2,000 mg by mouth daily.   Carboxymethylcellul-Glycerin (LUBRICATING EYE DROPS OP) Place 1 drop into both eyes daily as needed (dry eyes).   cholecalciferol (VITAMIN D3) 25 MCG (1000 UNIT) tablet Take 3,000 Units by mouth daily.   EPINEPHrine 0.3 mg/0.3 mL IJ SOAJ injection Inject into the  muscle.   glucose blood (ONE TOUCH ULTRA TEST) test strip 1 each by Other route daily. And lancets 1/day 250.00   insulin detemir (LEVEMIR) 100 UNIT/ML injection Inject 40 Units into the skin every morning.    levonorgestrel (MIRENA) 20 MCG/24HR IUD 1 each by Intrauterine route once.   metFORMIN (GLUCOPHAGE) 1000 MG tablet Take 1,000 mg by mouth 2 (two) times daily.    Multiple Vitamin (ONE-A-DAY ESSENTIAL PO) Take 1 tablet by mouth daily.   omeprazole (PRILOSEC) 20 MG capsule Take 20 mg by mouth daily as needed (reflux).   albuterol (VENTOLIN HFA) 108 (90 Base) MCG/ACT inhaler Inhale 2 puffs into the lungs every 6 (six) hours as needed for wheezing or shortness of breath. (Patient not taking: Reported on 03/25/2021)   zinc gluconate 50 MG tablet Take 50 mg by mouth daily. (Patient not taking: Reported on 02/10/2021)   [DISCONTINUED] ondansetron (ZOFRAN ODT) 4 MG disintegrating tablet Take 1 tablet (4 mg total) by mouth every 8 (eight) hours as needed for nausea or vomiting. (Patient not taking: Reported on 02/10/2021)   No facility-administered encounter medications on file as of 03/25/2021.    Follow-up: Return in about 3 months (around 06/25/2021).   Margarita Mail, DO

## 2021-03-25 NOTE — Assessment & Plan Note (Signed)
Last A1c over the summer 7.5%. Continue current medications. A1c, labs today. Foot exam today.  ?

## 2021-03-26 LAB — COMPLETE METABOLIC PANEL WITH GFR
AG Ratio: 1.2 (calc) (ref 1.0–2.5)
ALT: 9 U/L (ref 6–29)
AST: 10 U/L (ref 10–35)
Albumin: 4.1 g/dL (ref 3.6–5.1)
Alkaline phosphatase (APISO): 44 U/L (ref 31–125)
BUN: 12 mg/dL (ref 7–25)
CO2: 27 mmol/L (ref 20–32)
Calcium: 9.5 mg/dL (ref 8.6–10.2)
Chloride: 101 mmol/L (ref 98–110)
Creat: 0.7 mg/dL (ref 0.50–0.99)
Globulin: 3.4 g/dL (calc) (ref 1.9–3.7)
Glucose, Bld: 137 mg/dL — ABNORMAL HIGH (ref 65–99)
Potassium: 4 mmol/L (ref 3.5–5.3)
Sodium: 138 mmol/L (ref 135–146)
Total Bilirubin: 0.3 mg/dL (ref 0.2–1.2)
Total Protein: 7.5 g/dL (ref 6.1–8.1)
eGFR: 109 mL/min/{1.73_m2} (ref >=60)

## 2021-03-26 LAB — CBC WITH DIFFERENTIAL/PLATELET
Absolute Monocytes: 586 cells/uL (ref 200–950)
Basophils Absolute: 56 cells/uL (ref 0–200)
Basophils Relative: 0.6 %
Eosinophils Absolute: 177 cells/uL (ref 15–500)
Eosinophils Relative: 1.9 %
HCT: 37.8 % (ref 35.0–45.0)
Hemoglobin: 12.1 g/dL (ref 11.7–15.5)
Lymphs Abs: 3041 cells/uL (ref 850–3900)
MCH: 25.3 pg — ABNORMAL LOW (ref 27.0–33.0)
MCHC: 32 g/dL (ref 32.0–36.0)
MCV: 79.1 fL — ABNORMAL LOW (ref 80.0–100.0)
MPV: 11.3 fL (ref 7.5–12.5)
Monocytes Relative: 6.3 %
Neutro Abs: 5441 cells/uL (ref 1500–7800)
Neutrophils Relative %: 58.5 %
Platelets: 324 10*3/uL (ref 140–400)
RBC: 4.78 10*6/uL (ref 3.80–5.10)
RDW: 13.6 % (ref 11.0–15.0)
Total Lymphocyte: 32.7 %
WBC: 9.3 10*3/uL (ref 3.8–10.8)

## 2021-03-26 LAB — HEMOGLOBIN A1C
Hgb A1c MFr Bld: 6.9 % of total Hgb — ABNORMAL HIGH (ref <5.7)
Mean Plasma Glucose: 151 mg/dL
eAG (mmol/L): 8.4 mmol/L

## 2021-03-26 LAB — LIPID PANEL
Cholesterol: 166 mg/dL (ref <200)
HDL: 40 mg/dL — ABNORMAL LOW (ref >=50)
LDL Cholesterol (Calc): 104 mg/dL (calc) — ABNORMAL HIGH
Non-HDL Cholesterol (Calc): 126 mg/dL (calc) (ref <130)
Total CHOL/HDL Ratio: 4.2 (calc) (ref <5.0)
Triglycerides: 121 mg/dL (ref <150)

## 2021-03-26 LAB — MICROALBUMIN / CREATININE URINE RATIO
Creatinine, Urine: 193 mg/dL (ref 20–275)
Microalb Creat Ratio: 9 mcg/mg creat (ref <30)
Microalb, Ur: 1.8 mg/dL

## 2021-03-27 MED ORDER — ROSUVASTATIN CALCIUM 5 MG PO TABS: 5.0000 mg | ORAL_TABLET | Freq: Every day | ORAL | 3 refills | Status: DC

## 2021-03-27 NOTE — Addendum Note (Signed)
Addended by: Teodora Medici on: 03/27/2021 09:25 AM ? ? Modules accepted: Orders ? ?

## 2021-04-15 ENCOUNTER — Other Ambulatory Visit: Payer: Self-pay | Admitting: Internal Medicine

## 2021-04-15 NOTE — Telephone Encounter (Signed)
Copied from [place] [id number]. Topic: Quick Communication - Rx Refill/Question ?>> Apr 15, 2021  1:11 PM Leward Quan A wrote: ?Medication: insulin detemir (LEVEMIR) 100 UNIT/ML injection  ? ?Has the patient contacted their pharmacy? No. Dr Rosana Berger never filled Rx before ?(Agent: If no, request that the patient contact the pharmacy for the refill. If patient does not wish to contact the pharmacy document the reason why and proceed with request.) ?(Agent: If yes, when and what did the pharmacy advise?) ? ?Preferred Pharmacy (with phone number or street name): [place], [place]  ?Phone:  [phone] ?Fax:  [phone] ? ? ? ?Has the patient been seen for an appointment in the last year OR does the patient have an upcoming appointment? Yes.   ? ?Agent: Please be advised that RX refills may take up to 3 business days. We ask that you follow-up with your pharmacy. ?

## 2021-04-16 ENCOUNTER — Other Ambulatory Visit: Payer: Self-pay

## 2021-04-16 MED ORDER — INSULIN DETEMIR 100 UNIT/ML ~~LOC~~ SOLN: 40.0000 [IU] | SUBCUTANEOUS | 3 refills | Status: DC

## 2021-04-16 NOTE — Telephone Encounter (Signed)
Requested medication (s) are due for refill today: historical medication ? ?Requested medication (s) are on the active medication list: yes ? ?Last refill:  09/15/2016 ? ?Future visit scheduled: yes in 2 months ? ?Notes to clinic:  historical medication. Do you want to order Rx? Last ordered by historical provider ? ? ?  ?Requested Prescriptions  ?Pending Prescriptions Disp Refills  ? insulin detemir (LEVEMIR) 100 UNIT/ML injection 10 mL   ?  Sig: Inject 0.4 mLs (40 Units total) into the skin every morning.  ?  ? Endocrinology:  Diabetes - Insulins Passed - 04/15/2021  2:43 PM  ?  ?  Passed - HBA1C is between 0 and 7.9 and within 180 days  ?  Hgb A1c MFr Bld  ?Date Value Ref Range Status  ?03/25/2021 6.9 (H) <5.7 % of total Hgb Final  ?  Comment:  ?  For someone without known diabetes, a hemoglobin A1c ?value of 6.5% or greater indicates that they may have  ?diabetes and this should be confirmed with a follow-up  ?test. ?. ?For someone with known diabetes, a value <7% indicates  ?that their diabetes is well controlled and a value  ?greater than or equal to 7% indicates suboptimal  ?control. A1c targets should be individualized based on  ?duration of diabetes, age, comorbid conditions, and  ?other considerations. ?. ?Currently, no consensus exists regarding use of ?hemoglobin A1c for diagnosis of diabetes for children. ?. ?  ?  ?  ?  ?  Passed - Valid encounter within last 6 months  ?  Recent Outpatient Visits   ? ?      ? 3 weeks ago Type 2 diabetes mellitus without complication, unspecified whether long term insulin use ([place])  ? [facility] Teodora Medici, DO  ? ?  ?  ?Future Appointments   ? ?        ? In 2 months Teodora Medici, [facility], [place]  ? ?  ? ?  ?  ?  ? ?

## 2021-05-22 ENCOUNTER — Encounter: Payer: Self-pay | Admitting: Internal Medicine

## 2021-05-22 ENCOUNTER — Ambulatory Visit: Payer: BC Managed Care – PPO | Admitting: Internal Medicine

## 2021-05-22 VITALS — BP 132/88 | HR 106 | Temp 98.1°F | Resp 16 | Ht 64.0 in | Wt 179.5 lb

## 2021-05-22 DIAGNOSIS — R21 Rash and other nonspecific skin eruption: Secondary | ICD-10-CM

## 2021-05-22 NOTE — Patient Instructions (Addendum)
It was great seeing you today!  Plan discussed at today's visit: -Lumps feel like corns starting but no sign of infection -Spots look like bites, can use over the counter Benadryl cream for itching   Follow up in: already scheduled   Take care and let us know if you have any questions or concerns prior to your next visit.  Dr. Rosana Berger

## 2021-05-22 NOTE — Progress Notes (Signed)
   Acute Office Visit  Subjective:     [name], female    DOB: 1975/02/03, 46 y.o.   MRN: [id number]  Chief Complaint  Patient presents with   Foot Problem    Knots/lumps on bilateral foot no pain and possible bug bite on toe    HPI Patient is in today for spots on feet. First noticed about 1 week ago after she was at the beach, wearing flip flops and sperries without socks. Is a diabetic and was worried about changes in feet.  RASH Duration:   uncertain, maybe 1-2 weeks    Location:  bottoms of feet   Itching: no Burning: no Redness: no Oozing: no Scaling: no Blisters: no Painful: no Fevers: no Change in detergents/soaps/personal care products: no Recent illness: no Recent travel: just went to the beach History of same: no Context: stable Treatments attempted:nothing Shortness of breath: no  Throat/tongue swelling: no Myalgias/arthralgias: no   Review of Systems  Constitutional:  Negative for chills and fever.  Respiratory:  Negative for cough.   Gastrointestinal:  Negative for abdominal pain, constipation, diarrhea, nausea and vomiting.  Skin:  Negative for itching.       Objective:    BP 132/88   Pulse (!) 106   Temp 98.1 F (36.7 C)   Resp 16   Ht '5\' 4"'$  (1.626 m)   Wt 179 lb 8 oz (81.4 kg)   SpO2 98%   BMI 30.81 kg/m  BP Readings from Last 3 Encounters:  05/22/21 132/88  03/25/21 116/72  02/10/21 (!) 145/91   Wt Readings from Last 3 Encounters:  05/22/21 179 lb 8 oz (81.4 kg)  03/25/21 175 lb 12.8 oz (79.7 kg)  02/10/21 175 lb 6.4 oz (79.6 kg)      Physical Exam Constitutional:      Appearance: Normal appearance.  HENT:     Head: Normocephalic and atraumatic.  Eyes:     Conjunctiva/sclera: Conjunctivae normal.  Musculoskeletal:     Right lower leg: No edema.     Left lower leg: No edema.  Skin:    General: Skin is warm and dry.     Comments: Skin with a few small hyperpigmented macular lesions on the soles of  bilateral feet. No signs of infection present. Two areas of hardening tissue consistent with corns present as well.   Neurological:     General: No focal deficit present.     Mental Status: She is alert. Mental status is at baseline.  Psychiatric:        Mood and Affect: Mood normal.        Behavior: Behavior normal.    No results found for any visits on 05/22/21.      Assessment & Plan:   1. Rash of both feet: Looks like sand flea bites but uncertain. Not painful or itchy. No signs of infections, sensation intact. Up to date with diabetic foot exam. Can use over the counter steroid or anti-histamine cream for itchy but at this point we will monitor for changes or if it starts to spread. Follow up scheduled in June.    Return for already scheduled for June .  Teodora Medici, DO

## 2021-06-12 ENCOUNTER — Encounter: Payer: Self-pay | Admitting: Emergency Medicine

## 2021-06-12 ENCOUNTER — Emergency Department
Admission: EM | Admit: 2021-06-12 | Discharge: 2021-06-12 | Disposition: A | Discharge status: Home / Self Care | Payer: BC Managed Care – PPO | Attending: Emergency Medicine | Admitting: Emergency Medicine

## 2021-06-12 ENCOUNTER — Other Ambulatory Visit: Payer: Self-pay

## 2021-06-12 DIAGNOSIS — B349 Viral infection, unspecified: Secondary | ICD-10-CM | POA: Diagnosis not present

## 2021-06-12 DIAGNOSIS — Z20822 Contact with and (suspected) exposure to covid-19: Secondary | ICD-10-CM | POA: Insufficient documentation

## 2021-06-12 DIAGNOSIS — Z87442 Personal history of urinary calculi: Secondary | ICD-10-CM | POA: Insufficient documentation

## 2021-06-12 DIAGNOSIS — E119 Type 2 diabetes mellitus without complications: Secondary | ICD-10-CM | POA: Diagnosis not present

## 2021-06-12 DIAGNOSIS — R0981 Nasal congestion: Secondary | ICD-10-CM | POA: Diagnosis not present

## 2021-06-12 LAB — SARS CORONAVIRUS 2 BY RT PCR: SARS Coronavirus 2 by RT PCR: NEGATIVE

## 2021-06-12 MED ORDER — ONDANSETRON 4 MG PO TBDP: 4.0000 mg | ORAL_TABLET | Freq: Three times a day (TID) | ORAL | 0 refills | Status: DC | PRN

## 2021-06-12 NOTE — ED Provider Notes (Signed)
[facility] Provider Note    Event Date/Time   First MD Initiated Contact with Patient 06/12/21 0720     (approximate)   History   Nasal Congestion   HPI  Rose [name] is a 46 y.o. female presents to the emergency department for treatment and evaluation of nausea, congestion, and chills.  No relief with over-the-counter medications.  No known fever.  No known sick contact.  Past Medical History:  Diagnosis Date   ADHD    Anxiety    Diabetes mellitus without complication ([place])    Family history of adverse reaction to anesthesia    SISTER-TOO MUCH ANESTHESIA FOR C-SECTION   GERD (gastroesophageal reflux disease)    History of kidney stones    Neck pain    Right leg pain      Physical Exam   Triage Vital Signs: ED Triage Vitals  Enc Vitals Group     BP 06/12/21 0700 (!) 152/95     Pulse Rate 06/12/21 0700 90     Resp 06/12/21 0700 17     Temp 06/12/21 0700 98.3 F (36.8 C)     Temp Source 06/12/21 0700 Oral     SpO2 06/12/21 0700 98 %     Weight 06/12/21 0658 160 lb (72.6 kg)     Height 06/12/21 0658 '5\' 4"'$  (1.626 m)     Head Circumference --      Peak Flow --      Pain Score 06/12/21 0658 0     Pain Loc --      Pain Edu? --      Excl. in [place]? --     Most recent vital signs: Vitals:   06/12/21 0700  BP: (!) 152/95  Pulse: 90  Resp: 17  Temp: 98.3 F (36.8 C)  SpO2: 98%    General: Awake, no distress.  CV:  Good peripheral perfusion.  Resp:  Normal effort. Breath sounds clear to auscultation. Abd:  No distention.  Other:     ED Results / Procedures / Treatments   Labs (all labs ordered are listed, but only abnormal results are displayed) Labs Reviewed  SARS CORONAVIRUS 2 BY RT PCR     EKG  Not indicated   RADIOLOGY  Not indicated.  I have independently reviewed and interpreted imaging as well as reviewed report from radiology.  PROCEDURES:  Critical Care performed: No  Procedures   MEDICATIONS  ORDERED IN ED:  Medications - No data to display   IMPRESSION / MDM / [place] / ED COURSE   I reviewed the triage vital signs and the nursing notes.  Differential diagnosis includes, but is not limited to: Viral syndrome, URI, COVID, influenza  Patient's presentation is most consistent with acute illness / injury with system symptoms.  46 year old female presenting to the emergency department for treatment and evaluation of symptoms as described in the HPI.  Exam is reassuring.  COVID screening is negative.  Patient will be discharged home with symptomatic treatment's encouraged continuation of over-the-counter medications.  She was also given a prescription of Zofran if needed for nausea.  She was provided with a work excuse as well.  She is to follow-up with her primary care provider or return to the emergency department for symptoms of change or worsen.     FINAL CLINICAL IMPRESSION(S) / ED DIAGNOSES   Final diagnoses:  Acute viral syndrome     Rx / DC Orders   ED Discharge Orders  Ordered    ondansetron (ZOFRAN-ODT) 4 MG disintegrating tablet  Every 8 hours PRN        06/12/21 0824             Note:  This document was prepared using Dragon voice recognition software and may include unintentional dictation errors.   Victorino Dike, FNP 06/12/21 1224    Harvest Dark, MD 06/12/21 1424

## 2021-06-12 NOTE — ED Notes (Signed)
40 yom with a c/c of nasal congestion, chills, and a upset stomach since yesterday. The pt advised she has taken some cough medicine for her symptoms with no relief.

## 2021-06-12 NOTE — Discharge Instructions (Addendum)
Your COVID test is negative.  You may continue the over-the-counter medications.  Follow-up with your primary care provider if her symptoms are not improving over the next few days.  Take the Zofran as needed for nausea.  Use your albuterol inhaler if needed for cough, chest tightness, or wheezing.

## 2021-06-12 NOTE — ED Triage Notes (Signed)
Patient ambulatory to triage with steady gait, without difficulty or distress noted; pt reports since yesterday having nausea, congestion and chills; denies any sick contacts; denies pain or known fever

## 2021-06-13 ENCOUNTER — Institutional Professional Consult (permissible substitution): Payer: BC Managed Care – PPO | Admitting: Internal Medicine

## 2021-06-26 ENCOUNTER — Ambulatory Visit: Payer: BC Managed Care – PPO | Admitting: Internal Medicine

## 2021-06-30 ENCOUNTER — Ambulatory Visit: Payer: BC Managed Care – PPO | Admitting: Internal Medicine

## 2021-07-17 ENCOUNTER — Ambulatory Visit: Payer: BC Managed Care – PPO | Admitting: Internal Medicine

## 2021-08-05 ENCOUNTER — Encounter: Payer: Self-pay | Admitting: Internal Medicine

## 2021-08-05 ENCOUNTER — Ambulatory Visit: Payer: BC Managed Care – PPO | Admitting: Internal Medicine

## 2021-08-05 VITALS — BP 122/88 | HR 109 | Temp 98.8°F | Resp 16 | Ht 64.0 in | Wt 180.0 lb

## 2021-08-05 DIAGNOSIS — E119 Type 2 diabetes mellitus without complications: Secondary | ICD-10-CM

## 2021-08-05 DIAGNOSIS — R0602 Shortness of breath: Secondary | ICD-10-CM | POA: Diagnosis not present

## 2021-08-05 DIAGNOSIS — M546 Pain in thoracic spine: Secondary | ICD-10-CM

## 2021-08-05 DIAGNOSIS — F418 Other specified anxiety disorders: Secondary | ICD-10-CM | POA: Diagnosis not present

## 2021-08-05 LAB — POCT GLYCOSYLATED HEMOGLOBIN (HGB A1C): Hemoglobin A1C: 7.4 % — AB (ref 4.0–5.6)

## 2021-08-05 MED ORDER — QVAR REDIHALER 40 MCG/ACT IN AERB: 1.0000 | INHALATION_SPRAY | Freq: Two times a day (BID) | RESPIRATORY_TRACT | 1 refills | Status: DC

## 2021-08-05 MED ORDER — ALBUTEROL SULFATE (2.5 MG/3ML) 0.083% IN NEBU: 2.5000 mg | INHALATION_SOLUTION | Freq: Four times a day (QID) | RESPIRATORY_TRACT | 1 refills | Status: DC | PRN

## 2021-08-05 MED ORDER — FLUOXETINE HCL 10 MG PO TABS: 10.0000 mg | ORAL_TABLET | Freq: Every day | ORAL | 1 refills | Status: DC

## 2021-08-05 MED ORDER — NAPROXEN 500 MG PO TABS: 500.0000 mg | ORAL_TABLET | Freq: Two times a day (BID) | ORAL | 0 refills | Status: AC

## 2021-08-05 NOTE — Patient Instructions (Signed)
It was great seeing you today!  Plan discussed at today's visit: -Nebulizer machine and medicine to use as needed for wheezing/shortness of breath ordered -New steroid inhaler ordered as well, be sure to rinse your mouth well after use with water to prevent oral yeast infection -A1c increased to 7.4%, continue to check sugars  -Prozac 20 mg started, follow up in 6 weeks to recheck -Naproxen 500 mg twice a day for 10 days sent to pharmacy. Do NOT take with any other anti-inflammatory and take with food.  Follow up in: 6 weeks  Take care and let us know if you have any questions or concerns prior to your next visit.  Dr. Rosana Berger

## 2021-08-05 NOTE — Progress Notes (Signed)
Established Patient Office Visit  Subjective:  [name], female    DOB: 1975/04/20  Age: 46 y.o. MRN: [id number]  CC:  Chief Complaint  Patient presents with   Follow-up   Hyperlipidemia   Gastroesophageal Reflux   Diabetes   Depression   Back Pain    WANTS RX FOR ANTIINFLAMMATORY    HPI Rose [name] presents for follow up on chronic medical conditions.  Diabetes, Type 2: -Diagnosed 2013 -Last A1c 6.9% 3/23 -Medications: Metformin 1000 BID,  Levemir 40 units in the morning -Patient is compliant with the above medications and reports no side effects.  -Checking BG at home: fasting: 130-138; random sugar >200 but had been sick with a stomach virus a few times  -Eye exam: scheduled in August  -Foot exam: UTD 3/23 -Microalbumin: UTD 3/23 -Statin: Yes, added at [place]  -PNA vaccine: yes, 23 in 2014 -Denies symptoms of hypoglycemia, polyuria, polydipsia, numbness extremities, foot ulcers/trauma.   MDD: -Mood status: uncontrolled -Current treatment: Nothing  Psychotherapy/counseling: no  Previous psychiatric medications: buspar, celexa, cymbalta, and lexapro. Lexapro somehow contributed diabetes and Cymbalta made her tired.  Depressed mood: yes Anxious mood: yes Anhedonia: yes Insomnia: yes  Fatigue: yes Feelings of worthlessness or guilt: yes Impaired concentration/indecisiveness: yes Suicidal ideations: no     08/05/2021    2:52 PM 05/22/2021   10:45 AM 03/25/2021    1:17 PM  Depression screen PHQ 2/9  Decreased Interest '3 2 3  '$ Down, Depressed, Hopeless '3 2 3  '$ PHQ - 2 Score '6 4 6  '$ Altered sleeping 3 0 1  Tired, decreased energy 3 0 1  Change in appetite 2 0 1  Feeling bad or failure about yourself  2 0 1  Trouble concentrating 2 0 2  Moving slowly or fidgety/restless 0 0 0  Suicidal thoughts 0 0 0  PHQ-9 Score '18 4 12  '$ Difficult doing work/chores Very difficult Not difficult at all Somewhat difficult   BACK PAIN Duration:  days Mechanism of injury: unknown Location: midline and upper back Onset: gradual Quality: aching Frequency: intermittent Radiation: none Treatments attempted: APAP and ibuprofen  Relief with NSAIDs?: mild  GERD: -Currently on Omeprazole 20, controls symptoms   Short of Breath: -At rest and with activity -Worse when laying on right side -Albuterol helps given to her from the hospital helps but the one I gave her last time does not help as much  -Endorses wheezes, no cough -Does have nocturnal symptoms about twice a night at least once a week  Health Maintenance: -Blood work UTD -Mammogram 2/23 Birads-2 -Pap UTD, has Mirena    Past Medical History:  Diagnosis Date   ADHD    Anxiety    Diabetes mellitus without complication ([facility])    Family history of adverse reaction to anesthesia    SISTER-TOO MUCH ANESTHESIA FOR C-SECTION   GERD (gastroesophageal reflux disease)    History of kidney stones    Neck pain    Right leg pain     Past Surgical History:  Procedure Laterality Date   CESAREAN SECTION     CHOLECYSTECTOMY     CYSTOSCOPY W/ RETROGRADES  06/29/2019   Procedure: CYSTOSCOPY WITH RETROGRADE PYELOGRAM;  Surgeon: Billey Co, MD;  Location: [facility];  Service: Urology;;   CYSTOSCOPY WITH RETROGRADE PYELOGRAM, URETEROSCOPY AND STENT PLACEMENT Left 01/17/2019   Procedure: CYSTOSCOPY WITH RETROGRADE PYELOGRAM, URETEROSCOPY AND STENT PLACEMENT;  Surgeon: Abbie Sons, MD;  Location: [facility];  Service: Urology;  Laterality: Left;   CYSTOSCOPY/URETEROSCOPY/HOLMIUM LASER/STENT PLACEMENT Right 06/29/2019   Procedure: CYSTOSCOPY/URETEROSCOPY/HOLMIUM LASER/STENT PLACEMENT;  Surgeon: Billey Co, MD;  Location: [facility];  Service: Urology;  Laterality: Right;   EXTRACORPOREAL SHOCK WAVE LITHOTRIPSY Right 06/01/2019   Procedure: EXTRACORPOREAL SHOCK WAVE LITHOTRIPSY (ESWL);  Surgeon: Billey Co, MD;  Location: [facility];  Service: Urology;  Laterality: Right;    kidney stone removal  01/21, 05/21, 06/21   LAPAROSCOPY N/A 08/24/2013   Procedure: LAPAROSCOPY OPERATIVE with lysis of adhesions ;  Surgeon: Marylynn Pearson, MD;  Location: [facility];  Service: Gynecology;  Laterality: N/A;   STONE EXTRACTION WITH BASKET Left 01/17/2019   Procedure: STONE EXTRACTION WITH BASKET;  Surgeon: Abbie Sons, MD;  Location: [facility];  Service: Urology;  Laterality: Left;   wrist, cyst removal      Family History  Problem Relation Age of Onset   Aneurysm Mother    Blindness Father    High blood pressure Father    Hypertension Sister    Hypertension Sister    Diabetes Neg Hx     Social History   Socioeconomic History   Marital status: Single    Spouse name: Not on file   Number of children: 1   Years of education: GED   Highest education level: Not on file  Occupational History    Employer: [employer]  Tobacco Use   Smoking status: Former    Years: 1.00    Types: Cigarettes   Smokeless tobacco: Never  Vaping Use   Vaping Use: Some days   Substances: CBD  Substance and Sexual Activity   Alcohol use: Not Currently    Alcohol/week: 1.0 standard drink of alcohol    Types: 1 Standard drinks or equivalent per week    Comment: WINE OCC   Drug use: Not Currently    Types: Marijuana    Comment: last use 05/29/19   Sexual activity: Not Currently    Birth control/protection: I.U.D.  Other Topics Concern   Not on file  Social History Narrative   Patient is single and she takes care of her daughter. Patient works full time Web designer.   Education GED.   Right handed.   Caffeine one cup of coffee daily and soda daily.   Children One       Social Determinants of Health   Financial Resource Strain: Not on file  Food Insecurity: Not on file  Transportation Needs: Not on file  Physical Activity: Not on file  Stress: Not on file  Social Connections: Not on file  Intimate Partner Violence: Not on file    ROS Review of Systems   Constitutional:  Negative for chills and fever.  Eyes:  Negative for visual disturbance.  Respiratory:  Positive for shortness of breath. Negative for cough and wheezing.   Cardiovascular:  Negative for chest pain.  Gastrointestinal:  Negative for abdominal pain.    Objective:   Today's Vitals: BP 122/88   Pulse (!) 109   Temp 98.8 F (37.1 C)   Resp 16   Ht '5\' 4"'$  (1.626 m)   Wt 180 lb (81.6 kg)   SpO2 98%   BMI 30.90 kg/m   Physical Exam Vitals reviewed.  Constitutional:      Appearance: Normal appearance. She is well-developed.  HENT:     Head: Normocephalic and atraumatic.  Eyes:     Conjunctiva/sclera: Conjunctivae normal.  Cardiovascular:     Rate and Rhythm: Normal rate and regular rhythm.  Pulmonary:     Effort: Pulmonary effort is normal.     Breath sounds: Normal breath sounds. No wheezing, rhonchi or rales.  Musculoskeletal:     Right lower leg: No edema.     Left lower leg: No edema.  Skin:    General: Skin is warm and dry.  Neurological:     General: No focal deficit present.     Mental Status: She is alert. Mental status is at baseline.  Psychiatric:        Mood and Affect: Mood normal.        Behavior: Behavior normal.     Assessment & Plan:   1. Type 2 diabetes mellitus without complication, unspecified whether long term insulin use ([place]): A1c slightly increased today at 7.4%.  Patient states her sugars have been running a little bit higher lately after she had a bad stomach virus.  No changes to medications made today continue metformin 1000 mg twice daily and Levemir 40 units in the morning. Recheck A1c in 3 months.   - POCT HgB A1C  2. Depression with anxiety: Depression screening high today.  Has been on multiple SSRIs in the past, but cannot remember which ones or any side effects other than the Cymbalta and Lexapro.  Will start Prozac 10 mg daily.  Plan to recheck in 6 weeks.  - FLUoxetine (PROZAC) 10 MG tablet; Take 1 tablet (10 mg total)  by mouth daily.  Dispense: 30 tablet; Refill: 1  3. Shortness of breath: Most likely secondary to asthma, has a pulmonary appointment on 8/11 for PFTs.  In the meantime I will order albuterol nebulizer and breathing machine to use as needed.  She will also be started on a daily steroid inhaler as she is having frequent nocturnal symptoms.  - albuterol (PROVENTIL) (2.5 MG/3ML) 0.083% nebulizer solution; Take 3 mLs (2.5 mg total) by nebulization every 6 (six) hours as needed for wheezing or shortness of breath.  Dispense: 150 mL; Refill: 1 - For home use only DME Other see comment - beclomethasone (QVAR REDIHALER) 40 MCG/ACT inhaler; Inhale 1 puff into the lungs 2 (two) times daily.  Dispense: 1 each; Refill: 1  4. Acute midline thoracic back pain: Over-the-counter anti-inflammatories not as effective, will use naproxen 500 mg twice daily x10 days.  She will not take any other anti-inflammatories while on this medication will take it with food.  - naproxen (NAPROSYN) 500 MG tablet; Take 1 tablet (500 mg total) by mouth 2 (two) times daily with a meal for 10 days.  Dispense: 20 tablet; Refill: 0   Follow-up: Return in 6 weeks (on 09/16/2021).   Teodora Medici, DO

## 2021-08-07 ENCOUNTER — Emergency Department: Payer: BC Managed Care – PPO

## 2021-08-07 ENCOUNTER — Emergency Department
Admission: EM | Admit: 2021-08-07 | Discharge: 2021-08-07 | Disposition: A | Discharge status: Home / Self Care | Payer: BC Managed Care – PPO | Attending: Emergency Medicine | Admitting: Emergency Medicine

## 2021-08-07 ENCOUNTER — Encounter: Payer: Self-pay | Admitting: Emergency Medicine

## 2021-08-07 ENCOUNTER — Other Ambulatory Visit: Payer: Self-pay

## 2021-08-07 DIAGNOSIS — N200 Calculus of kidney: Secondary | ICD-10-CM | POA: Insufficient documentation

## 2021-08-07 DIAGNOSIS — Z20822 Contact with and (suspected) exposure to covid-19: Secondary | ICD-10-CM | POA: Diagnosis not present

## 2021-08-07 DIAGNOSIS — N289 Disorder of kidney and ureter, unspecified: Secondary | ICD-10-CM | POA: Diagnosis not present

## 2021-08-07 DIAGNOSIS — D4100 Neoplasm of uncertain behavior of unspecified kidney: Secondary | ICD-10-CM | POA: Diagnosis not present

## 2021-08-07 DIAGNOSIS — N12 Tubulo-interstitial nephritis, not specified as acute or chronic: Secondary | ICD-10-CM

## 2021-08-07 DIAGNOSIS — N2889 Other specified disorders of kidney and ureter: Secondary | ICD-10-CM | POA: Insufficient documentation

## 2021-08-07 DIAGNOSIS — R42 Dizziness and giddiness: Secondary | ICD-10-CM | POA: Diagnosis not present

## 2021-08-07 DIAGNOSIS — E119 Type 2 diabetes mellitus without complications: Secondary | ICD-10-CM | POA: Diagnosis not present

## 2021-08-07 DIAGNOSIS — R1110 Vomiting, unspecified: Secondary | ICD-10-CM | POA: Diagnosis not present

## 2021-08-07 DIAGNOSIS — Z9049 Acquired absence of other specified parts of digestive tract: Secondary | ICD-10-CM | POA: Diagnosis not present

## 2021-08-07 DIAGNOSIS — J45909 Unspecified asthma, uncomplicated: Secondary | ICD-10-CM | POA: Insufficient documentation

## 2021-08-07 LAB — URINALYSIS, ROUTINE W REFLEX MICROSCOPIC
Bilirubin Urine: NEGATIVE
Glucose, UA: NEGATIVE mg/dL
Ketones, ur: NEGATIVE mg/dL
Nitrite: NEGATIVE
Protein, ur: NEGATIVE mg/dL
Specific Gravity, Urine: 1.017 (ref 1.005–1.030)
pH: 5 (ref 5.0–8.0)

## 2021-08-07 LAB — CBC
HCT: 38 % (ref 36.0–46.0)
Hemoglobin: 12.1 g/dL (ref 12.0–15.0)
MCH: 24.8 pg — ABNORMAL LOW (ref 26.0–34.0)
MCHC: 31.8 g/dL (ref 30.0–36.0)
MCV: 78 fL — ABNORMAL LOW (ref 80.0–100.0)
Platelets: 333 10*3/uL (ref 150–400)
RBC: 4.87 MIL/uL (ref 3.87–5.11)
RDW: 14.3 % (ref 11.5–15.5)
WBC: 8.3 10*3/uL (ref 4.0–10.5)
nRBC: 0 % (ref 0.0–0.2)

## 2021-08-07 LAB — RESP PANEL BY RT-PCR (FLU A&B, COVID) ARPGX2
Influenza A by PCR: NEGATIVE
Influenza B by PCR: NEGATIVE
SARS Coronavirus 2 by RT PCR: NEGATIVE

## 2021-08-07 LAB — BASIC METABOLIC PANEL
Anion gap: 8 (ref 5–15)
BUN: 12 mg/dL (ref 6–20)
CO2: 24 mmol/L (ref 22–32)
Calcium: 8.9 mg/dL (ref 8.9–10.3)
Chloride: 105 mmol/L (ref 98–111)
Creatinine, Ser: 0.7 mg/dL (ref 0.44–1.00)
GFR, Estimated: >60 mL/min (ref >60)
Glucose, Bld: 139 mg/dL — ABNORMAL HIGH (ref 70–99)
Potassium: 3.8 mmol/L (ref 3.5–5.1)
Sodium: 137 mmol/L (ref 135–145)

## 2021-08-07 LAB — POC URINE PREG, ED
Preg Test, Ur: NEGATIVE
Preg Test, Ur: NEGATIVE

## 2021-08-07 LAB — TROPONIN I (HIGH SENSITIVITY): Troponin I (High Sensitivity): 3 ng/L (ref <18)

## 2021-08-07 MED ORDER — IOHEXOL 300 MG/ML  SOLN
100.0000 mL | Freq: Once | INTRAMUSCULAR | Status: AC | PRN
  Administered 2021-08-07: 100 mL via INTRAVENOUS

## 2021-08-07 MED ORDER — CEFDINIR 300 MG PO CAPS: 300.0000 mg | ORAL_CAPSULE | Freq: Two times a day (BID) | ORAL | 0 refills | Status: AC

## 2021-08-07 MED ORDER — SODIUM CHLORIDE 0.9 % IV SOLN
1.0000 g | INTRAVENOUS | Status: DC
  Administered 2021-08-07: 1 g via INTRAVENOUS
  Filled 2021-08-07: qty 10

## 2021-08-07 MED ORDER — SODIUM CHLORIDE 0.9 % IV BOLUS
1000.0000 mL | Freq: Once | INTRAVENOUS | Status: AC
  Administered 2021-08-07: 1000 mL via INTRAVENOUS

## 2021-08-07 NOTE — Discharge Instructions (Signed)
Please follow-up with your urologist regarding the kidney mass as it is recommended that you get an outpatient MRI with and without contrast.  As for your current infection, please take the antibiotics as prescribed.  Please return for any new, worsening, or change in symptoms or other concerns.  It was a pleasure caring for you today.

## 2021-08-07 NOTE — ED Provider Notes (Signed)
[facility] Provider Note    Event Date/Time   First MD Initiated Contact with Patient 08/07/21 [phone]     (approximate)   History   Knee Pain and Dizziness   HPI  Rose [name] is a 46 y.o. female with a past medical history of hyperlipidemia, diabetes, depression, anxiety, kidney stones, pyelonephritis who presents today for evaluation of low back pain, nausea, burning with urination, as well as bilateral knee pain.  She reports that her legs feel achy as if she is "coming down with something."  She noted that her blood sugar was elevated today to 180.  She reports that she had 1 episode of vomiting this morning.  She reports that her belly feels "weird" but she does not have any pain.  No fevers or chills.  No neck pain or headache, no cough or runny nose.  Patient Active Problem List   Diagnosis Date Noted   Pyelonephritis 07/15/2020   History of kidney stones    GERD (gastroesophageal reflux disease)    Family history of high cholesterol 03/31/2017   Family history of hypertension 03/31/2017   IUD (intrauterine device) in place 03/31/2017   Hyperlipidemia 06/05/2016   Neck pain 09/19/2013   Right leg pain    Diabetes ([place]) 09/18/2013   Mood disorder ([place]) 04/20/2013   Mild intermittent asthma 12/06/2012   Diabetes mellitus type II, uncontrolled 12/06/2012   Depression with anxiety 12/06/2012          Physical Exam   Triage Vital Signs: ED Triage Vitals  Enc Vitals Group     BP 08/07/21 0637 (!) 149/95     Pulse Rate 08/07/21 0637 (!) 103     Resp 08/07/21 0637 16     Temp 08/07/21 0637 98.6 F (37 C)     Temp Source 08/07/21 0637 Oral     SpO2 08/07/21 0637 99 %     Weight 08/07/21 0636 170 lb (77.1 kg)     Height 08/07/21 0636 '5\' 4"'$  (1.626 m)     Head Circumference --      Peak Flow --      Pain Score 08/07/21 0637 8     Pain Loc --      Pain Edu? --      Excl. in [place]? --     Most recent vital signs: Vitals:   08/07/21  1045 08/07/21 1247  BP: 130/80 132/78  Pulse: 88 88  Resp: 16 16  Temp: 98 F (36.7 C) 98.4 F (36.9 C)  SpO2: 96% 96%    Physical Exam Vitals and nursing note reviewed.  Constitutional:      General: Awake and alert. No acute distress.    Appearance: Normal appearance. The patient is normal weight.  HENT:     Head: Normocephalic and atraumatic.     Mouth: Mucous membranes are moist.  Eyes:     General: PERRL. Normal EOMs        Right eye: No discharge.        Left eye: No discharge.     Conjunctiva/sclera: Conjunctivae normal.  Cardiovascular:     Rate and Rhythm: Normal rate and regular rhythm.     Pulses: Normal pulses.     Heart sounds: Normal heart sounds Pulmonary:     Effort: Pulmonary effort is normal. No respiratory distress.     Breath sounds: Normal breath sounds.  Abdominal:     Abdomen is soft. There is no abdominal tenderness. No  rebound or guarding. No distention. Musculoskeletal:        General: No swelling. Normal range of motion.     Cervical back: Normal range of motion and neck supple.  Skin:    General: Skin is warm and dry.     Capillary Refill: Capillary refill takes less than 2 seconds.     Findings: No rash.  Neurological:     Mental Status: The patient is awake and alert.      ED Results / Procedures / Treatments   Labs (all labs ordered are listed, but only abnormal results are displayed) Labs Reviewed  BASIC METABOLIC PANEL - Abnormal; Notable for the following components:      Result Value   Glucose, Bld 139 (*)    All other components within normal limits  CBC - Abnormal; Notable for the following components:   MCV 78.0 (*)    MCH 24.8 (*)    All other components within normal limits  URINALYSIS, ROUTINE W REFLEX MICROSCOPIC - Abnormal; Notable for the following components:   Color, Urine YELLOW (*)    APPearance HAZY (*)    Hgb urine dipstick SMALL (*)    Leukocytes,Ua SMALL (*)    Bacteria, UA RARE (*)    All other  components within normal limits  RESP PANEL BY RT-PCR (FLU A&B, COVID) ARPGX2  POC URINE PREG, ED  POC URINE PREG, ED  TROPONIN I (HIGH SENSITIVITY)     EKG     RADIOLOGY I independently reviewed and interpreted imaging and agree with radiologists findings.     PROCEDURES:  Critical Care performed:   Procedures   MEDICATIONS ORDERED IN ED: Medications  cefTRIAXone (ROCEPHIN) 1 g in sodium chloride 0.9 % 100 mL IVPB (0 g Intravenous Stopped 08/07/21 1246)  sodium chloride 0.9 % bolus 1,000 mL (0 mLs Intravenous Stopped 08/07/21 1246)  iohexol (OMNIPAQUE) 300 MG/ML solution 100 mL (100 mLs Intravenous Contrast Given 08/07/21 1009)     IMPRESSION / MDM / ASSESSMENT AND PLAN / ED COURSE  I reviewed the triage vital signs and the nursing notes.   Differential diagnosis includes, but is not limited to, UTI/pyelonephritis, gastroenteritis, hyperglycemia, DKA, dehydration, electrolyte disarray.  Patient is awake and alert, hemodynamically stable and afebrile.  Her labs are overall reassuring.  Mild elevation in her blood sugar, though normal anion gap, normal bicarb, do not suspect diabetic ketoacidosis.  Urinalysis does reveal leukocytes and blood suspicious for possible infection.  Patient does have a history of both pyelonephritis as well as nephrolithiasis.  CT scan was obtained for further evaluation and to evaluate for possible infected stone.  CT scan reveals possibility of pyelonephritis without stone.  No clinical evidence of Sepsis/SIRS. Appropriate for outpatient management. CT also found hypodense 11 mm left renal lesion similar in size to November 2021 but measuring hounsfield units greater than that expected for simple cyst, possibly reflecting a hemorrhagic or proteinaceous cyst or pseudo enhancement.  Radiologist read this as a possible indolent renal neoplasm not excluded.  I discussed this possibility with the patient, and advised her to get an outpatient MRI with and without  contrast.  Patient reports that she is aware of this and has already seen a urologist about it.  She understands the recommendations.  She was given a dose of ceftriaxone for pyelonephritis, and discharged on 10 days of cefdinir.  We discussed return precautions and outpatient follow-up.  Patient understands and agrees with plan.  Discharged in stable condition.   Patient's  presentation is most consistent with acute complicated illness / injury requiring diagnostic workup.     FINAL CLINICAL IMPRESSION(S) / ED DIAGNOSES   Final diagnoses:  Pyelonephritis  Kidney mass     Rx / DC Orders   ED Discharge Orders          Ordered    cefdinir (OMNICEF) 300 MG capsule  2 times daily        08/07/21 1236             Note:  This document was prepared using Dragon voice recognition software and may include unintentional dictation errors.   Emeline Gins 08/07/21 1359    Vanessa [place], MD 08/08/21 [phone]

## 2021-08-07 NOTE — ED Triage Notes (Signed)
Pt to ED from home c/o bilateral knee pain, lower back pain, nausea, dizziness x2 days.  Vomited yesterday.  States hx DBM and CBG at home this morning 187.  States burning with urination.  Denies CP, SOB, or fevers.  Pt A&Ox4, chest rise even and unlabored, skin WNL and in NAD at this time.

## 2021-08-15 ENCOUNTER — Ambulatory Visit
Admission: RE | Admit: 2021-08-15 | Discharge: 2021-08-15 | Disposition: A | Discharge status: Home / Self Care | Payer: BC Managed Care – PPO | Source: Ambulatory Visit | Attending: Internal Medicine | Admitting: Internal Medicine

## 2021-08-15 ENCOUNTER — Encounter: Payer: Self-pay | Admitting: Internal Medicine

## 2021-08-15 ENCOUNTER — Other Ambulatory Visit
Admission: RE | Admit: 2021-08-15 | Discharge: 2021-08-15 | Disposition: A | Discharge status: Home / Self Care | Payer: BC Managed Care – PPO | Source: Ambulatory Visit | Attending: Internal Medicine | Admitting: Internal Medicine

## 2021-08-15 ENCOUNTER — Ambulatory Visit (INDEPENDENT_AMBULATORY_CARE_PROVIDER_SITE_OTHER): Payer: BC Managed Care – PPO | Admitting: Internal Medicine

## 2021-08-15 DIAGNOSIS — R0602 Shortness of breath: Secondary | ICD-10-CM | POA: Diagnosis not present

## 2021-08-15 DIAGNOSIS — R0609 Other forms of dyspnea: Secondary | ICD-10-CM | POA: Diagnosis not present

## 2021-08-15 DIAGNOSIS — R079 Chest pain, unspecified: Secondary | ICD-10-CM | POA: Diagnosis not present

## 2021-08-15 LAB — CBC WITH DIFFERENTIAL/PLATELET
Abs Immature Granulocytes: 0.01 10*3/uL (ref 0.00–0.07)
Basophils Absolute: 0.1 10*3/uL (ref 0.0–0.1)
Basophils Relative: 1 %
Eosinophils Absolute: 0.1 10*3/uL (ref 0.0–0.5)
Eosinophils Relative: 2 %
HCT: 37.3 % (ref 36.0–46.0)
Hemoglobin: 11.9 g/dL — ABNORMAL LOW (ref 12.0–15.0)
Immature Granulocytes: 0 %
Lymphocytes Relative: 43 %
Lymphs Abs: 3 10*3/uL (ref 0.7–4.0)
MCH: 24.9 pg — ABNORMAL LOW (ref 26.0–34.0)
MCHC: 31.9 g/dL (ref 30.0–36.0)
MCV: 78.2 fL — ABNORMAL LOW (ref 80.0–100.0)
Monocytes Absolute: 0.5 10*3/uL (ref 0.1–1.0)
Monocytes Relative: 7 %
Neutro Abs: 3.3 10*3/uL (ref 1.7–7.7)
Neutrophils Relative %: 47 %
Platelets: 333 10*3/uL (ref 150–400)
RBC: 4.77 MIL/uL (ref 3.87–5.11)
RDW: 14.2 % (ref 11.5–15.5)
WBC: 6.9 10*3/uL (ref 4.0–10.5)
nRBC: 0 % (ref 0.0–0.2)

## 2021-08-15 LAB — TSH: TSH: 1.084 u[IU]/mL (ref 0.350–4.500)

## 2021-08-15 LAB — SEDIMENTATION RATE: Sed Rate: 21 mm/hr — ABNORMAL HIGH (ref 0–20)

## 2021-08-15 NOTE — Progress Notes (Unsigned)
Rose [name], female    DOB: Mar 06, 1975   MRN: [id number]   Brief patient profile:  46   yobf stopped vaping 2022  referred to pulmonary clinic in [facility]  08/15/2021 by Dr Wells Guiles in [place] for sob.  Was told by mother bad pna as child < age 46 and admit to ER multiple times prior to pregnancy at [facility] for "asthma" and "bronchitis" and w/u by [place] allergy pos only to mold and better by the time Pregnancy at age 46 s asthma flare then.   Cat since 2020/ same house 2011          History of Present Illness  08/15/2021  Pulmonary/ 1st office eval/ Mitchell Epling / [employer] on no maint or prns at time of ov(empty saba)  Chief Complaint  Patient presents with   Consult    Sob for about 6 months.  Has funny feeling in right lung, sometimes feels like pins and needles.  Had cxr and did not show anything.  Can be sob with exertion or sitting still.  Dyspnea:  Not limited by breathing from desired activities /some problem with steps at work "like everybody else"  but sob now at rest not reproduced with exertion Cough: none Sleep: flat bed/ 2 pillows can't lie on R side due to discomfort x 2 years fine on back and other side  SABA use: empty inhaler of saba/ has not started yet on Qvar rec by PCP   No obvious day to day or daytime pattern/variability or assoc excess/ purulent sputum or mucus plugs or hemoptysis or cp or chest tightness, subjective wheeze or overt sinus or hb symptoms.   Sleeping on back and L side down  without nocturnal  or early am exacerbation  of respiratory  c/o's or need for noct saba. Also denies any obvious fluctuation of symptoms with weather or environmental changes or other aggravating or alleviating factors except as outlined above   No unusual exposure hx or h/o childhood pna/ asthma or knowledge of premature birth.  Current Allergies, Complete Past Medical History, Past Surgical History, Family History, and Social History were reviewed in  [employer] record.  ROS  The following are not active complaints unless bolded Hoarseness, sore throat, dysphagia, dental problems, itching, sneezing,  nasal congestion or discharge of excess mucus or purulent secretions, ear ache,   fever, chills, sweats, unintended wt loss or wt gain, classically pleuritic or exertional cp,  orthopnea pnd or arm/hand swelling  or leg swelling, presyncope, palpitations, abdominal pain, anorexia, nausea, vomiting, diarrhea  or change in bowel habits or change in bladder habits, change in stools or change in urine, dysuria, hematuria,  rash, arthralgias, visual complaints, headache, numbness, weakness or ataxia or problems with walking or coordination,  change in mood/anxious or  memory.          Past Medical History:  Diagnosis Date   ADHD    Anxiety    Diabetes mellitus without complication ([place])    Family history of adverse reaction to anesthesia    SISTER-TOO MUCH ANESTHESIA FOR C-SECTION   GERD (gastroesophageal reflux disease)    History of kidney stones    Neck pain    Right leg pain     Outpatient Medications Prior to Visit  Medication Sig Dispense Refill   Ascorbic Acid (VITAMIN C) 1000 MG tablet Take 2,000 mg by mouth daily.     Carboxymethylcellul-Glycerin (LUBRICATING EYE DROPS OP) Place 1 drop into both eyes  daily as needed (dry eyes).     cefdinir (OMNICEF) 300 MG capsule Take 1 capsule (300 mg total) by mouth 2 (two) times daily for 10 days. 20 capsule 0   cholecalciferol (VITAMIN D3) 25 MCG (1000 UNIT) tablet Take 3,000 Units by mouth daily.     EPINEPHrine 0.3 mg/0.3 mL IJ SOAJ injection Inject into the muscle.     glucose blood (ONE TOUCH ULTRA TEST) test strip 1 each by Other route daily. And lancets 1/day 250.00 100 each 12   insulin detemir (LEVEMIR) 100 UNIT/ML injection Inject 0.4 mLs (40 Units total) into the skin every morning. 10 mL 3   levonorgestrel (MIRENA) 20 MCG/24HR IUD 1 each by Intrauterine route  once.     metFORMIN (GLUCOPHAGE) 1000 MG tablet Take 1,000 mg by mouth 2 (two) times daily.      rosuvastatin (CRESTOR) 5 MG tablet Take 1 tablet (5 mg total) by mouth daily. 90 tablet 3   albuterol (PROVENTIL) (2.5 MG/3ML) 0.083% nebulizer solution Take 3 mLs (2.5 mg total) by nebulization every 6 (six) hours as needed for wheezing or shortness of breath. (Patient not taking: Reported on 08/15/2021) 150 mL 1   beclomethasone (QVAR REDIHALER) 40 MCG/ACT inhaler Inhale 1 puff into the lungs 2 (two) times daily. (Patient not taking: Reported on 08/15/2021) 1 each 1   FLUoxetine (PROZAC) 10 MG tablet Take 1 tablet (10 mg total) by mouth daily. (Patient not taking: Reported on 08/15/2021) 30 tablet 1   Multiple Vitamin (ONE-A-DAY ESSENTIAL PO) Take 1 tablet by mouth daily. (Patient not taking: Reported on 08/15/2021)     naproxen (NAPROSYN) 500 MG tablet Take 1 tablet (500 mg total) by mouth 2 (two) times daily with a meal for 10 days. (Patient not taking: Reported on 08/15/2021) 20 tablet 0   omeprazole (PRILOSEC) 20 MG capsule Take 1 capsule (20 mg total) by mouth daily as needed (reflux). (Patient not taking: Reported on 08/15/2021) 90 capsule 3   zinc gluconate 50 MG tablet Take 50 mg by mouth daily. (Patient not taking: Reported on 08/15/2021)     ondansetron (ZOFRAN-ODT) 4 MG disintegrating tablet Take 1 tablet (4 mg total) by mouth every 8 (eight) hours as needed for nausea or vomiting. (Patient not taking: Reported on 08/15/2021) 20 tablet 0   No facility-administered medications prior to visit.     Objective:     BP 120/84 (BP Location: Right Arm, Patient Position: Sitting, Cuff Size: Large)   Pulse 98   Temp 98 F (36.7 C) (Oral)   Ht '5\' 4"'$  (1.626 m)   Wt 181 lb 3.2 oz (82.2 kg)   SpO2 98%   BMI 31.10 kg/m   SpO2: 98 %  Amb bf nad    HEENT : Oropharynx  clear      Nasal turbinates nl    NECK :  without  apparent JVD/ palpable Nodes/TM    LUNGS: no acc muscle use,  Nl contour  chest which is clear to A and P bilaterally without cough on insp or exp maneuvers   CV:  RRR  no s3 or murmur or increase in P2, and no edema   ABD:  soft and nontender with nl inspiratory excursion in the supine position. No bruits or organomegaly appreciated   MS:  Nl gait/ ext warm without deformities Or obvious joint restrictions  calf tenderness, cyanosis or clubbing    SKIN: warm and dry without lesions    NEURO:  alert, approp, nl sensorium with  no motor or cerebellar deficits apparent.   CXR PA and Lateral:   08/15/2021 :    I personally reviewed images and agree with radiology impression as follows:   Wnl  My review:  nothing in the R upper chest to explain tingling sensation when lying on R side   Labs ordered/ reviewed:      Chemistry      Component Value Date/Time   NA 137 08/07/2021 0640   NA 139 03/26/2017 1545   K 3.8 08/07/2021 0640   CL 105 08/07/2021 0640   CO2 24 08/07/2021 0640   BUN 12 08/07/2021 0640   BUN 9 03/26/2017 1545   CREATININE 0.70 08/07/2021 0640   CREATININE 0.70 03/25/2021 1412      Component Value Date/Time   CALCIUM 8.9 08/07/2021 0640   ALKPHOS 65 08/02/2020 0936   AST 10 03/25/2021 1412   ALT 9 03/25/2021 1412   BILITOT 0.3 03/25/2021 1412   BILITOT 0.3 03/26/2017 1545        Lab Results  Component Value Date   WBC 6.9 08/15/2021   HGB 11.9 (L) 08/15/2021   HCT 37.3 08/15/2021   MCV 78.2 (L) 08/15/2021   PLT 333 08/15/2021       EOS 0.1                                                                                              No results found for: "DDIMER"    Lab Results  Component Value Date   TSH 1.084 08/15/2021         Lab Results  Component Value Date   ESRSEDRATE 21 (H) 08/15/2021          Assessment   No problem-specific Assessment & Plan notes found for this encounter.     Christinia Gully, MD 08/16/2021

## 2021-08-15 NOTE — Patient Instructions (Addendum)
Plan A = Automatic = Always=    Qvar Take 2 puffs first thing in am and then another 2 puffs about 12 hours later.    Work on inhaler technique:  relax and gently blow all the way out then take a nice smooth full deep breath back in, triggering the inhaler at same time you start breathing in.  Hold breath in for at least  5 seconds if you can. Blow out qvar  thru nose. Rinse and gargle with water when done.  If mouth or throat bother you at all,  try brushing teeth/gums/tongue with arm and hammer toothpaste/ make a slurry and gargle and spit out.      Plan B = Backup (to supplement plan A, not to replace it) Only use your albuterol inhaler as a rescue medication to be used if you can't catch your breath by resting or doing a relaxed purse lip breathing pattern.  - The less you use it, the better it will work when you need it. - Ok to use the inhaler up to 2 puffs  every 4 hours if you must but call for appointment if use goes up over your usual need - Don't leave home without it !!  (think of it like the spare tire for your car)   Plan C = Crisis (instead of Plan B but only if Plan B stops working) - only use your albuterol nebulizer if you first try Plan B and it fails to help > ok to use the nebulizer up to every 4 hours but if start needing it regularly call for immediate appointment    Please remember to go to the lab and x-ray department  for your tests - we will call you with the results when they are available.  Schedule PFTs prior to next visit - do not use your inhalers that day       Please schedule a follow up office visit in 6 weeks, call sooner if needed

## 2021-08-16 ENCOUNTER — Encounter: Payer: Self-pay | Admitting: Internal Medicine

## 2021-08-16 NOTE — Assessment & Plan Note (Addendum)
Onset 2023 sob no worse with exertion assoc with [facility] only present when lies on R side s pleuritic features  - D/c Vaping  2022 - Allergy screen 08/15/2021 >  Eos 0.1 /  IgE  Pending  - 08/15/2021  After extensive coaching inhaler device,  effectiveness =    50% from a baseline of nearly 0   Given how poorly she uses hfa and the lack on noct or exertional symptoms I think it's very unlikely she has asthma and now that she's not vaping should expect her lung function prior to saba to be normal.  Since she already purchased qvar fine with me if she takes it for now and uses saba as a backup but by the time of pfts if notes no clinical difference can probably stop qvar and if has airflow obst on pfts can start a laba/ics combination at that point   Overall I think her prognosis is excellent and will plan to regroup with her at 6 weeks to be sure she's headed in the right direction.          Each maintenance medication was reviewed in detail including emphasizing most importantly the difference between maintenance and prns and under what circumstances the prns are to be triggered using an action plan format where appropriate.  Total time for H and P, chart review, counseling, reviewing hfa/neb device(s) and generating customized AVS unique to this office visit / same day charting > 45 min for new pt with multiple chest symptoms of unknown origin.

## 2021-08-20 LAB — IGE: IgE (Immunoglobulin E), Serum: 63 IU/mL (ref 6–495)

## 2021-08-22 ENCOUNTER — Ambulatory Visit
Admission: RE | Admit: 2021-08-22 | Discharge: 2021-08-22 | Disposition: A | Discharge status: Home / Self Care | Payer: BC Managed Care – PPO | Source: Ambulatory Visit | Attending: Urology | Admitting: Urology

## 2021-08-22 ENCOUNTER — Ambulatory Visit (INDEPENDENT_AMBULATORY_CARE_PROVIDER_SITE_OTHER): Payer: BC Managed Care – PPO | Admitting: Urology

## 2021-08-22 ENCOUNTER — Other Ambulatory Visit: Payer: Self-pay

## 2021-08-22 ENCOUNTER — Encounter: Payer: Self-pay | Admitting: Urology

## 2021-08-22 ENCOUNTER — Ambulatory Visit
Admission: RE | Admit: 2021-08-22 | Discharge: 2021-08-22 | Disposition: A | Discharge status: Home / Self Care | Payer: BC Managed Care – PPO | Attending: Urology | Admitting: Urology

## 2021-08-22 VITALS — BP 128/89 | HR 103 | Ht 64.0 in | Wt 180.0 lb

## 2021-08-22 DIAGNOSIS — N200 Calculus of kidney: Secondary | ICD-10-CM | POA: Diagnosis not present

## 2021-08-22 DIAGNOSIS — N2889 Other specified disorders of kidney and ureter: Secondary | ICD-10-CM | POA: Diagnosis not present

## 2021-08-22 DIAGNOSIS — A599 Trichomoniasis, unspecified: Secondary | ICD-10-CM | POA: Diagnosis not present

## 2021-08-22 DIAGNOSIS — I878 Other specified disorders of veins: Secondary | ICD-10-CM | POA: Diagnosis not present

## 2021-08-22 DIAGNOSIS — R140 Abdominal distension (gaseous): Secondary | ICD-10-CM | POA: Diagnosis not present

## 2021-08-22 DIAGNOSIS — Z87442 Personal history of urinary calculi: Secondary | ICD-10-CM | POA: Diagnosis not present

## 2021-08-22 LAB — URINALYSIS, COMPLETE
Bilirubin, UA: NEGATIVE
Glucose, UA: NEGATIVE
Ketones, UA: NEGATIVE
Nitrite, UA: NEGATIVE
Protein,UA: NEGATIVE
RBC, UA: NEGATIVE
Specific Gravity, UA: 1.02 (ref 1.005–1.030)
Urobilinogen, Ur: 0.2 mg/dL (ref 0.2–1.0)
pH, UA: 7 (ref 5.0–7.5)

## 2021-08-22 LAB — MICROSCOPIC EXAMINATION: Epithelial Cells (non renal): >10 /hpf — AB (ref 0–10)

## 2021-08-22 MED ORDER — METRONIDAZOLE 500 MG PO TABS: 500.0000 mg | ORAL_TABLET | Freq: Two times a day (BID) | ORAL | 0 refills | Status: AC

## 2021-08-22 NOTE — Progress Notes (Signed)
08/22/21 12:25 PM   Rose [name] 10-09-1975 [id number]  Referring provider:  Department, [facility] [place],  [place] [address]  Urological history  Nephrolithiasis  - S/p ESWL in 01/2019 - office visit with Dr. Diamantina Providence on 11/09/2019, she was experiencing bilateral flank pain and bilateral groin pain associated with urgency, frequency, dysuria, pelvic pain and nausea - RUS completed on November 15, 2019 noted no hydronephrosis or stones, but a slight interval increase in the complex cyst in the inferior pole of the left kidney that measured 1.3 x 1.3 x 1.6 cm in February 2021 and is now 1.4 x 1.6 x 1.6 cm on this exam. - At ED visit in 07/2020 CT scans on 7/11 and 7/29 that showed no evidence of ureteral stones or renal stones or hydronephrosis  Chief Complaint  Patient presents with   Nephrolithiasis   Follow-up     HPI: Rose [name] is a 46 y.o.female who presents today for a 1 year follow-up with KUB.  She was recently seen in the ED on 08/07/2021. She presented with aches  in legs. Urinalysis had leukocytes and blood suspicious for possible infection. CT scan was obtained for further evaluation and to evaluate for possible infected stone.  CT scan reveals possibility of pyelonephritis without stone.  No clinical evidence of Sepsis/SIRS. Appropriate for outpatient management. CT also found hypodense 11 mm left renal lesion similar in size to November 2021 but measuring hounsfield units greater than that expected for simple cyst, possibly reflecting a hemorrhagic or proteinaceous cyst or pseudo enhancement.    She reports that she is monogamous but she is unsure if her partner is monogamous.   Patient denies any modifying or aggravating factors.  Patient denies any gross hematuria, dysuria or suprapubic/flank pain. Patient denies any fevers, chills, nausea or vomiting.   UA today has trichomonas.   PMH: Past Medical History:   Diagnosis Date   ADHD    Anxiety    Diabetes mellitus without complication (HCC)    Family history of adverse reaction to anesthesia    SISTER-TOO MUCH ANESTHESIA FOR C-SECTION   GERD (gastroesophageal reflux disease)    History of kidney stones    Neck pain    Right leg pain     Surgical History: Past Surgical History:  Procedure Laterality Date   CESAREAN SECTION     CHOLECYSTECTOMY     CYSTOSCOPY W/ RETROGRADES  06/29/2019   Procedure: CYSTOSCOPY WITH RETROGRADE PYELOGRAM;  Surgeon: Billey Co, MD;  Location: [facility];  Service: Urology;;   CYSTOSCOPY WITH RETROGRADE PYELOGRAM, URETEROSCOPY AND STENT PLACEMENT Left 01/17/2019   Procedure: CYSTOSCOPY WITH RETROGRADE PYELOGRAM, URETEROSCOPY AND STENT PLACEMENT;  Surgeon: Abbie Sons, MD;  Location: [facility];  Service: Urology;  Laterality: Left;   CYSTOSCOPY/URETEROSCOPY/HOLMIUM LASER/STENT PLACEMENT Right 06/29/2019   Procedure: CYSTOSCOPY/URETEROSCOPY/HOLMIUM LASER/STENT PLACEMENT;  Surgeon: Billey Co, MD;  Location: [facility];  Service: Urology;  Laterality: Right;   EXTRACORPOREAL SHOCK WAVE LITHOTRIPSY Right 06/01/2019   Procedure: EXTRACORPOREAL SHOCK WAVE LITHOTRIPSY (ESWL);  Surgeon: Billey Co, MD;  Location: [facility];  Service: Urology;  Laterality: Right;   kidney stone removal  01/21, 05/21, 06/21   LAPAROSCOPY N/A 08/24/2013   Procedure: LAPAROSCOPY OPERATIVE with lysis of adhesions ;  Surgeon: Marylynn Pearson, MD;  Location: [facility];  Service: Gynecology;  Laterality: N/A;   STONE EXTRACTION WITH BASKET Left 01/17/2019   Procedure: STONE EXTRACTION WITH BASKET;  Surgeon: John Giovanni  C, MD;  Location: [facility];  Service: Urology;  Laterality: Left;   wrist, cyst removal      Home Medications:  Allergies as of 08/22/2021       Reactions   Bactrim [sulfamethoxazole-trimethoprim] Swelling   Neurontin [gabapentin] Itching   Sulfa Antibiotics Other (See Comments)        Medication List         Accurate as of August 22, 2021 12:25 PM. If you have any questions, ask your nurse or doctor.          albuterol (2.5 MG/3ML) 0.083% nebulizer solution Commonly known as: PROVENTIL Take 3 mLs (2.5 mg total) by nebulization every 6 (six) hours as needed for wheezing or shortness of breath.   cholecalciferol 25 MCG (1000 UNIT) tablet Commonly known as: VITAMIN D3 Take 3,000 Units by mouth daily.   EPINEPHrine 0.3 mg/0.3 mL Soaj injection Commonly known as: EPI-PEN Inject into the muscle.   FLUoxetine 10 MG tablet Commonly known as: PROZAC Take 1 tablet (10 mg total) by mouth daily.   glucose blood test strip Commonly known as: ONE TOUCH ULTRA TEST 1 each by Other route daily. And lancets 1/day 250.00   insulin detemir 100 UNIT/ML injection Commonly known as: LEVEMIR Inject 0.4 mLs (40 Units total) into the skin every morning.   levonorgestrel 20 MCG/24HR IUD Commonly known as: MIRENA 1 each by Intrauterine route once.   LUBRICATING EYE DROPS OP Place 1 drop into both eyes daily as needed (dry eyes).   metFORMIN 1000 MG tablet Commonly known as: GLUCOPHAGE Take 1,000 mg by mouth 2 (two) times daily.   metroNIDAZOLE 500 MG tablet Commonly known as: Flagyl Take 1 tablet (500 mg total) by mouth 2 (two) times daily for 7 days. Started by: Zara Council, PA-C   omeprazole 20 MG capsule Commonly known as: PRILOSEC Take 1 capsule (20 mg total) by mouth daily as needed (reflux).   ONE-A-DAY ESSENTIAL PO Take 1 tablet by mouth daily.   Qvar RediHaler 40 MCG/ACT inhaler Generic drug: beclomethasone Inhale 1 puff into the lungs 2 (two) times daily.   rosuvastatin 5 MG tablet Commonly known as: Crestor Take 1 tablet (5 mg total) by mouth daily.   vitamin C 1000 MG tablet Take 2,000 mg by mouth daily.   zinc gluconate 50 MG tablet Take 50 mg by mouth daily.        Allergies:  Allergies  Allergen Reactions   Bactrim [Sulfamethoxazole-Trimethoprim]  Swelling   Neurontin [Gabapentin] Itching   Sulfa Antibiotics Other (See Comments)    Family History: Family History  Problem Relation Age of Onset   Aneurysm Mother    Blindness Father    High blood pressure Father    Hypertension Sister    Hypertension Sister    Diabetes Neg Hx     Social History:  reports that she has quit smoking. Her smoking use included e-cigarettes. She has been exposed to tobacco smoke. She has never used smokeless tobacco. She reports that she does not currently use alcohol after a past usage of about 1.0 standard drink of alcohol per week. She reports that she does not currently use drugs after having used the following drugs: Marijuana.   Physical Exam: BP 128/89   Pulse (!) 103   Ht '5\' 4"'$  (1.626 m)   Wt 180 lb (81.6 kg)   BMI 30.90 kg/m   Constitutional:  Alert and oriented, No acute distress. HEENT: [place] AT, moist mucus membranes.  Trachea midline  Cardiovascular: No clubbing, cyanosis, or edema. Respiratory: Normal respiratory effort, no increased work of breathing. Neurologic: Grossly intact, no focal deficits, moving all 4 extremities. Psychiatric: Normal mood and affect.  Laboratory Data: Lab Results  Component Value Date   CREATININE 0.70 08/07/2021    Lab Results  Component Value Date   HGBA1C 7.4 (A) 08/05/2021   CBC    Component Value Date/Time   WBC 6.9 08/15/2021 1032   RBC 4.77 08/15/2021 1032   HGB 11.9 (L) 08/15/2021 1032   HGB 12.0 03/26/2017 1545   HCT 37.3 08/15/2021 1032   HCT 36.0 03/26/2017 1545   PLT 333 08/15/2021 1032   PLT 351 03/26/2017 1545   MCV 78.2 (L) 08/15/2021 1032   MCV 76 (L) 03/26/2017 1545   MCH 24.9 (L) 08/15/2021 1032   MCHC 31.9 08/15/2021 1032   RDW 14.2 08/15/2021 1032   RDW 15.0 03/26/2017 1545   LYMPHSABS 3.0 08/15/2021 1032   MONOABS 0.5 08/15/2021 1032   EOSABS 0.1 08/15/2021 1032   BASOSABS 0.1 08/15/2021 1032   CMP     Component Value Date/Time   NA 137 08/07/2021 0640   NA  139 03/26/2017 1545   K 3.8 08/07/2021 0640   CL 105 08/07/2021 0640   CO2 24 08/07/2021 0640   GLUCOSE 139 (H) 08/07/2021 0640   BUN 12 08/07/2021 0640   BUN 9 03/26/2017 1545   CREATININE 0.70 08/07/2021 0640   CREATININE 0.70 03/25/2021 1412   CALCIUM 8.9 08/07/2021 0640   PROT 7.5 03/25/2021 1412   PROT 7.0 03/26/2017 1545   ALBUMIN 3.5 08/02/2020 0936   ALBUMIN 4.2 03/26/2017 1545   AST 10 03/25/2021 1412   ALT 9 03/25/2021 1412   ALKPHOS 65 08/02/2020 0936   BILITOT 0.3 03/25/2021 1412   BILITOT 0.3 03/26/2017 1545   GFRNONAA >60 08/07/2021 0640   GFRAA >60 01/16/2019 1049    Urinalysis 11-30 WBCs, moderate bacteria, and > 10 epithelial cells. Trichomonas was present  Pertinent Imaging: CLINICAL DATA:  Nausea vomiting, recurrent UTI flank pain renal stone suspected.   EXAM: CT ABDOMEN AND PELVIS WITH CONTRAST   TECHNIQUE: Multidetector CT imaging of the abdomen and pelvis was performed using the standard protocol following bolus administration of intravenous contrast.   RADIATION DOSE REDUCTION: This exam was performed according to the departmental dose-optimization program which includes automated exposure control, adjustment of the mA and/or kV according to patient size and/or use of iterative reconstruction technique.   CONTRAST:  126m OMNIPAQUE IOHEXOL 300 MG/ML  SOLN   COMPARISON:  Multiple priors including most recent CT abdomen pelvis dated August 02, 2020.   FINDINGS: Lower chest: Mild distal esophageal wall thickening.   Hepatobiliary: No suspicious hepatic lesion. Gallbladder surgically absent. No biliary ductal dilation.   Pancreas: No pancreatic ductal dilation or evidence of acute inflammation.   Spleen: No splenomegaly or focal splenic lesion.   Adrenals/Urinary Tract: Bilateral adrenal glands appear normal. No hydronephrosis. Hypodense left renal lesion measures 1.3 cm on image 42/2 previously measuring 1.2 cm November 27, 2019 this  lesion measures Hounsfield units of 36 greater than that expected for a simple cyst. Very subtle hypoenhancement of the right kidney. No nephrolithiasis identified on this noncontrast examination. Urinary bladder is unremarkable for degree of distension.   Stomach/Bowel: No radiopaque enteric contrast material was administered. Stomach is unremarkable for degree of distension. No pathologic dilation of small or large bowel. The appendix and terminal ileum appear normal. No evidence of acute bowel inflammation.  Vascular/Lymphatic: Normal caliber abdominal aorta. No pathologically enlarged abdominal or pelvic lymph nodes.   Reproductive: Intrauterine device in place. No suspicious adnexal mass.   Other: Trace pelvic free fluid is within physiologic normal limits. Stranding in the anterior abdominal wall commonly reflect sequela of subcutaneous injections.   Musculoskeletal: No acute osseous abnormality.   IMPRESSION: 1. Very subtle hypoenhancement of the right kidney may reflect artifact. However, suggest further evaluation with laboratory values to exclude pyelonephritis. 2. Mild distal esophageal wall thickening. Correlate for symptoms of esophagitis and consider endoscopy on an outpatient basis. 3. Hypodense 11 mm left renal lesion similar in size back to November 27, 2019 but measuring Hounsfield units greater than that expected for a simple cyst possibly reflecting a hemorrhagic/proteinaceous cyst or pseudo enhancement. However, an indolent renal neoplasm is not excluded. Consider more definitive characterization by renal mass protocol MRI with and without contrast.     Electronically Signed   By: Dahlia Bailiff M.D.   On: 08/07/2021 10:33  I have independently reviewed the films.  See HPI.    Assessment & Plan:    Left indeterminate renal mass - Will schedule MRI for further evaluation of this   2. Trichomonas  - Will treat her with Flagyl; discussed with her  to refrain from alcohol consumption when taking this antibiotic.  - Encourage her to refrain from unprotected sexual intercourse until she finishes her antibiotic and to inform her partner so they can get treated and tested  Return for MRI report/repeat UA .  [facility] [address] [place], [place] [address] [phone]  I, Kirke Shaggy Littlejohn,acting as a scribe for [facility], PA-C.,have documented all relevant documentation on the behalf of Oliviya Gilkison, PA-C,as directed by  [facility], PA-C while in the presence of [place], PA-C.  I have reviewed the above documentation for accuracy and completeness, and I agree with the above.    Zara Council, PA-C

## 2021-08-26 ENCOUNTER — Other Ambulatory Visit: Payer: Self-pay | Admitting: Urology

## 2021-08-26 DIAGNOSIS — N2889 Other specified disorders of kidney and ureter: Secondary | ICD-10-CM

## 2021-08-30 DIAGNOSIS — E119 Type 2 diabetes mellitus without complications: Secondary | ICD-10-CM | POA: Diagnosis not present

## 2021-09-09 ENCOUNTER — Other Ambulatory Visit: Payer: BC Managed Care – PPO

## 2021-09-10 ENCOUNTER — Ambulatory Visit
Admission: RE | Admit: 2021-09-10 | Discharge: 2021-09-10 | Disposition: A | Discharge status: Home / Self Care | Payer: BC Managed Care – PPO | Source: Ambulatory Visit | Attending: Urology | Admitting: Urology

## 2021-09-10 DIAGNOSIS — N2889 Other specified disorders of kidney and ureter: Secondary | ICD-10-CM

## 2021-09-10 DIAGNOSIS — Z9049 Acquired absence of other specified parts of digestive tract: Secondary | ICD-10-CM | POA: Diagnosis not present

## 2021-09-10 DIAGNOSIS — N6012 Diffuse cystic mastopathy of left breast: Secondary | ICD-10-CM | POA: Diagnosis not present

## 2021-09-10 DIAGNOSIS — N281 Cyst of kidney, acquired: Secondary | ICD-10-CM | POA: Diagnosis not present

## 2021-09-10 MED ORDER — GADOBENATE DIMEGLUMINE 529 MG/ML IV SOLN
15.0000 mL | Freq: Once | INTRAVENOUS | Status: AC | PRN
  Administered 2021-09-10: 15 mL via INTRAVENOUS

## 2021-09-15 ENCOUNTER — Other Ambulatory Visit: Payer: Self-pay

## 2021-09-15 ENCOUNTER — Encounter: Payer: Self-pay | Admitting: Internal Medicine

## 2021-09-15 MED ORDER — "INSULIN SYRINGE-NEEDLE U-100 30G X 5/16"" 0.5 ML MISC": 1.0000 | Freq: Every day | 3 refills | Status: DC

## 2021-09-16 ENCOUNTER — Ambulatory Visit: Payer: BC Managed Care – PPO | Admitting: Internal Medicine

## 2021-09-22 NOTE — Progress Notes (Unsigned)
09/23/21 1:35 PM   Rose [name] 1975-05-12 [id number]  Referring provider:  Teodora Medici, [place],  [place] [address]  Urological history  Nephrolithiasis  - S/p ESWL in 01/2019 - office visit with Dr. Diamantina Providence on 11/09/2019, she was experiencing bilateral flank pain and bilateral groin pain associated with urgency, frequency, dysuria, pelvic pain and nausea - RUS completed on November 15, 2019 noted no hydronephrosis or stones, but a slight interval increase in the complex cyst in the inferior pole of the left kidney that measured 1.3 x 1.3 x 1.6 cm in February 2021 and is now 1.4 x 1.6 x 1.6 cm on this exam. - At ED visit in 07/2020 CT scans on 7/11 and 7/29 that showed no evidence of ureteral stones or renal stones or hydronephrosis  Chief Complaint  Patient presents with   Nephrolithiasis     HPI: Rose [name] is a 46 y.o.female who presents today for recheck on urinalysis for trichomonas and to review MRI results.  At her visit on August 18, she was found to have trichomonas on urinalysis and she has since been treated.  There also was found to be hypodense 11 mm left renal lesion on CT performed during the emergency room visit on August 07, 2021 for which she underwent MRI for further characterization.  MRI performed on September 10, 2021 found the area of concern to be consistent with a left benign Bosniak classification 2 renal cyst.  She is having intermittent left lower quadrant pain.  She has constipation on occasions.  She denied any vaginal discharge.  Patient denies any modifying or aggravating factors.  Patient denies any gross hematuria, dysuria or suprapubic/flank pain.  Patient denies any fevers, chills, nausea or vomiting.    UA 6-10 WBC's and many bacteria but with > 10 squames, so specimen is contaminated.    PMH: Past Medical History:  Diagnosis Date   ADHD    Anxiety    Diabetes mellitus without complication  (HCC)    Family history of adverse reaction to anesthesia    SISTER-TOO MUCH ANESTHESIA FOR C-SECTION   GERD (gastroesophageal reflux disease)    History of kidney stones    Neck pain    Right leg pain     Surgical History: Past Surgical History:  Procedure Laterality Date   CESAREAN SECTION     CHOLECYSTECTOMY     CYSTOSCOPY W/ RETROGRADES  06/29/2019   Procedure: CYSTOSCOPY WITH RETROGRADE PYELOGRAM;  Surgeon: Billey Co, MD;  Location: [facility];  Service: Urology;;   CYSTOSCOPY WITH RETROGRADE PYELOGRAM, URETEROSCOPY AND STENT PLACEMENT Left 01/17/2019   Procedure: CYSTOSCOPY WITH RETROGRADE PYELOGRAM, URETEROSCOPY AND STENT PLACEMENT;  Surgeon: Abbie Sons, MD;  Location: [facility];  Service: Urology;  Laterality: Left;   CYSTOSCOPY/URETEROSCOPY/HOLMIUM LASER/STENT PLACEMENT Right 06/29/2019   Procedure: CYSTOSCOPY/URETEROSCOPY/HOLMIUM LASER/STENT PLACEMENT;  Surgeon: Billey Co, MD;  Location: [facility];  Service: Urology;  Laterality: Right;   EXTRACORPOREAL SHOCK WAVE LITHOTRIPSY Right 06/01/2019   Procedure: EXTRACORPOREAL SHOCK WAVE LITHOTRIPSY (ESWL);  Surgeon: Billey Co, MD;  Location: [facility];  Service: Urology;  Laterality: Right;   kidney stone removal  01/21, 05/21, 06/21   LAPAROSCOPY N/A 08/24/2013   Procedure: LAPAROSCOPY OPERATIVE with lysis of adhesions ;  Surgeon: Marylynn Pearson, MD;  Location: [facility];  Service: Gynecology;  Laterality: N/A;   STONE EXTRACTION WITH BASKET Left 01/17/2019   Procedure: STONE EXTRACTION WITH BASKET;  Surgeon: Abbie Sons, MD;  Location: [facility];  Service: Urology;  Laterality: Left;   wrist, cyst removal      Home Medications:  Allergies as of 09/23/2021       Reactions   Bactrim [sulfamethoxazole-trimethoprim] Swelling   Neurontin [gabapentin] Itching   Sulfa Antibiotics Other (See Comments)        Medication List        Accurate as of September 23, 2021  1:35 PM. If you have any questions, ask  your nurse or doctor.          albuterol (2.5 MG/3ML) 0.083% nebulizer solution Commonly known as: PROVENTIL Take 3 mLs (2.5 mg total) by nebulization every 6 (six) hours as needed for wheezing or shortness of breath.   cholecalciferol 25 MCG (1000 UNIT) tablet Commonly known as: VITAMIN D3 Take 3,000 Units by mouth daily.   EPINEPHrine 0.3 mg/0.3 mL Soaj injection Commonly known as: EPI-PEN Inject into the muscle.   FLUoxetine 10 MG tablet Commonly known as: PROZAC Take 1 tablet (10 mg total) by mouth daily.   glucose blood test strip Commonly known as: ONE TOUCH ULTRA TEST 1 each by Other route daily. And lancets 1/day 250.00   insulin detemir 100 UNIT/ML injection Commonly known as: LEVEMIR Inject 0.4 mLs (40 Units total) into the skin every morning.   Insulin Syringe-Needle U-100 30G X 5/16" 0.5 ML Misc Commonly known as: Safety Insulin Syringes 1 Syringe by Does not apply route daily.   levonorgestrel 20 MCG/24HR IUD Commonly known as: MIRENA 1 each by Intrauterine route once.   LUBRICATING EYE DROPS OP Place 1 drop into both eyes daily as needed (dry eyes).   metFORMIN 1000 MG tablet Commonly known as: GLUCOPHAGE Take 1,000 mg by mouth 2 (two) times daily.   omeprazole 20 MG capsule Commonly known as: PRILOSEC Take 1 capsule (20 mg total) by mouth daily as needed (reflux).   ONE-A-DAY ESSENTIAL PO Take 1 tablet by mouth daily.   Qvar RediHaler 40 MCG/ACT inhaler Generic drug: beclomethasone Inhale 1 puff into the lungs 2 (two) times daily.   rosuvastatin 5 MG tablet Commonly known as: Crestor Take 1 tablet (5 mg total) by mouth daily.   vitamin C 1000 MG tablet Take 2,000 mg by mouth daily.   zinc gluconate 50 MG tablet Take 50 mg by mouth daily.        Allergies:  Allergies  Allergen Reactions   Bactrim [Sulfamethoxazole-Trimethoprim] Swelling   Neurontin [Gabapentin] Itching   Sulfa Antibiotics Other (See Comments)    Family  History: Family History  Problem Relation Age of Onset   Aneurysm Mother    Blindness Father    High blood pressure Father    Hypertension Sister    Hypertension Sister    Diabetes Neg Hx     Social History:  reports that she has quit smoking. Her smoking use included e-cigarettes. She has been exposed to tobacco smoke. She has never used smokeless tobacco. She reports that she does not currently use alcohol after a past usage of about 1.0 standard drink of alcohol per week. She reports that she does not currently use drugs after having used the following drugs: Marijuana.   Physical Exam: BP 128/86   Pulse 96   Ht '5\' 4"'$  (1.626 m)   Wt 179 lb (81.2 kg)   BMI 30.73 kg/m   Constitutional:  Well nourished. Alert and oriented, No acute distress. HEENT: [place] AT, moist mucus membranes.  Trachea midline, no masses. Cardiovascular: No clubbing, cyanosis,  or edema. Respiratory: Normal respiratory effort, no increased work of breathing. Neurologic: Grossly intact, no focal deficits, moving all 4 extremities. Psychiatric: Normal mood and affect.    Laboratory Data: Urinalysis See Epic and HPI  I have reviewed the labs.   Pertinent Imaging: CLINICAL DATA:  Further evaluation of indeterminate renal mass.   EXAM: MRI ABDOMEN WITHOUT AND WITH CONTRAST   TECHNIQUE: Multiplanar multisequence MR imaging of the abdomen was performed both before and after the administration of intravenous contrast.   CONTRAST:  30m MULTIHANCE GADOBENATE DIMEGLUMINE 529 MG/ML IV SOLN   COMPARISON:  Multiple priors including most recent CT abdomen pelvis dated August 07, 2021.   FINDINGS: Lower chest: No acute abnormality. Multiple cystic foci in the left breast are incompletely evaluated on this examination and were previously evaluated with mammography on March 04, 2021 please refer to that study for further recommendations on follow-up.   Hepatobiliary: No significant hepatic steatosis. No  suspicious hepatic lesion. Gallbladder surgically absent. No biliary ductal dilation.   Pancreas: No pancreatic ductal dilation or evidence of acute inflammation. No cystic or solid hyperenhancing pancreatic lesion identified.   Spleen:  No splenomegaly or focal splenic lesion.   Adrenals/Urinary Tract: Bilateral adrenal glands appear normal. No hydronephrosis. Fluid signal lesion in the left lower pole kidney measures 14 mm on image 36/6 with a thin internal septation without suspicious nodular postcontrast enhancement or wall/septal thickening consistent with a benign Bosniak classification 2 renal cyst. No solid enhancing renal mass.   Stomach/Bowel: Visualized portions within the abdomen are unremarkable.   Vascular/Lymphatic: No pathologically enlarged lymph nodes identified. No abdominal aortic aneurysm demonstrated.   Other:  None.   Musculoskeletal: No suspicious bone lesions identified.   IMPRESSION: 1. Benign left lower pole renal cyst corresponding with the lesion on prior CT requires no additional imaging follow-up. 2. Multiple cystic foci in the left breast are incompletely evaluated on this examination and were previously evaluated by mammography on March 04, 2021 please refer to that study regarding follow-up recommendations.     Electronically Signed   By: JDahlia BailiffM.D.   On: 09/10/2021 15:15  I have independently reviewed the films.  See HPI.    Assessment & Plan:    Left indeterminate renal mass -Benign renal cyst -No further follow-up is warranted  2. Trichomonas  - Not present on today's UA, but advised to notify gynecology and see if any further work-up is warranted as she has an IUD in place  3. Left lower quadrant pain -Not likely urological in nature as no stones were seen on recent CT and no hydronephrosis was seen on recent MRI -UA is contaminated, but no pyuria or hematuria is noted -Advised to follow-up with PCP or gynecology  for further evaluation and management  4. Nephrolithiasis -Patient will follow-up in 1 year for KUB to monitor for any stone production  Return in about 1 year (around 09/24/2022) for KUB.  [place] [place] [phone]

## 2021-09-23 ENCOUNTER — Ambulatory Visit (INDEPENDENT_AMBULATORY_CARE_PROVIDER_SITE_OTHER): Payer: BC Managed Care – PPO | Admitting: Urology

## 2021-09-23 ENCOUNTER — Encounter: Payer: Self-pay | Admitting: Urology

## 2021-09-23 VITALS — BP 128/86 | HR 96 | Ht 64.0 in | Wt 179.0 lb

## 2021-09-23 DIAGNOSIS — R1032 Left lower quadrant pain: Secondary | ICD-10-CM | POA: Diagnosis not present

## 2021-09-23 DIAGNOSIS — N200 Calculus of kidney: Secondary | ICD-10-CM

## 2021-09-23 DIAGNOSIS — N281 Cyst of kidney, acquired: Secondary | ICD-10-CM | POA: Diagnosis not present

## 2021-09-23 DIAGNOSIS — A599 Trichomoniasis, unspecified: Secondary | ICD-10-CM

## 2021-09-23 DIAGNOSIS — Z87442 Personal history of urinary calculi: Secondary | ICD-10-CM

## 2021-09-23 LAB — MICROSCOPIC EXAMINATION: Epithelial Cells (non renal): >10 /hpf — AB (ref 0–10)

## 2021-09-23 LAB — URINALYSIS, COMPLETE
Bilirubin, UA: NEGATIVE
Nitrite, UA: NEGATIVE
Protein,UA: NEGATIVE
Specific Gravity, UA: 1.02 (ref 1.005–1.030)
Urobilinogen, Ur: 1 mg/dL (ref 0.2–1.0)
pH, UA: 6 (ref 5.0–7.5)

## 2021-09-29 ENCOUNTER — Other Ambulatory Visit: Payer: Self-pay

## 2021-09-29 MED ORDER — "INSULIN SYRINGE-NEEDLE U-100 30G X 5/16"" 0.5 ML MISC": 1.0000 | Freq: Every day | 3 refills | Status: DC

## 2021-11-18 ENCOUNTER — Encounter: Payer: Self-pay | Admitting: Internal Medicine

## 2021-11-19 MED ORDER — METFORMIN HCL 1000 MG PO TABS
1000.0000 mg | ORAL_TABLET | Freq: Two times a day (BID) | ORAL | 0 refills | Status: DC
Start: 2021-11-19 — End: 2022-02-25

## 2021-11-19 MED ORDER — INSULIN DETEMIR 100 UNIT/ML ~~LOC~~ SOLN: 40.0000 [IU] | SUBCUTANEOUS | 3 refills | Status: DC

## 2022-01-27 DIAGNOSIS — Z1231 Encounter for screening mammogram for malignant neoplasm of breast: Secondary | ICD-10-CM | POA: Diagnosis not present

## 2022-02-02 NOTE — Progress Notes (Unsigned)
Established Patient Office Visit  Subjective:  [name], female    DOB: 06-Jul-1975  Age: 47 y.o. MRN: [id number]  CC:  No chief complaint on file.   HPI Rose Dean Pepperman presents for follow up on chronic medical conditions.  Diabetes, Type 2: -Diagnosed 2013 -Last A1c 7.4% 8/23 -Medications: Metformin 1000 BID,  Levemir 40 units in the morning -Patient is compliant with the above medications and reports no side effects.  -Checking BG at home: fasting: 130-138; random sugar >200 but had been sick with a stomach virus a few times  -Eye exam: scheduled in August  -Foot exam: UTD 3/23 -Microalbumin: UTD 3/23 -Statin: Yes -PNA vaccine: yes, 23 in 2014, due for prevnar 20  -Denies symptoms of hypoglycemia, polyuria, polydipsia, numbness extremities, foot ulcers/trauma.   MDD: -Mood status: uncontrolled -Current treatment: Started on Prozac 10 mg in August  Psychotherapy/counseling: no  Previous psychiatric medications: buspar, celexa, cymbalta, and lexapro. Lexapro somehow contributed diabetes and Cymbalta made her tired.  Depressed mood: yes Anxious mood: yes Anhedonia: yes Insomnia: yes  Fatigue: yes Feelings of worthlessness or guilt: yes Impaired concentration/indecisiveness: yes Suicidal ideations: no     08/05/2021    2:52 PM 05/22/2021   10:45 AM 03/25/2021    1:17 PM  Depression screen PHQ 2/9  Decreased Interest '3 2 3  '$ Down, Depressed, Hopeless '3 2 3  '$ PHQ - 2 Score '6 4 6  '$ Altered sleeping 3 0 1  Tired, decreased energy 3 0 1  Change in appetite 2 0 1  Feeling bad or failure about yourself  2 0 1  Trouble concentrating 2 0 2  Moving slowly or fidgety/restless 0 0 0  Suicidal thoughts 0 0 0  PHQ-9 Score '18 4 12  '$ Difficult doing work/chores Very difficult Not difficult at all Somewhat difficult   GERD: -Currently on Omeprazole 20 mg, controls symptoms   Short of Breath: -At rest and with activity -Worse when laying on right  side -Albuterol helps given to her from the hospital helps but the one I gave her last time does not help as much  -Endorses wheezes, no cough -Does have nocturnal symptoms about twice a night at least once a week -Started on Qvar in August   Health Maintenance: -Blood work UTD -Mammogram 2/23 Birads-2 - will be due soon  -Pap UTD, has Mirena    Past Medical History:  Diagnosis Date   ADHD    Anxiety    Diabetes mellitus without complication (HCC)    Family history of adverse reaction to anesthesia    SISTER-TOO MUCH ANESTHESIA FOR C-SECTION   GERD (gastroesophageal reflux disease)    History of kidney stones    Neck pain    Right leg pain     Past Surgical History:  Procedure Laterality Date   CESAREAN SECTION     CHOLECYSTECTOMY     CYSTOSCOPY W/ RETROGRADES  06/29/2019   Procedure: CYSTOSCOPY WITH RETROGRADE PYELOGRAM;  Surgeon: Billey Co, MD;  Location: [facility];  Service: Urology;;   CYSTOSCOPY WITH RETROGRADE PYELOGRAM, URETEROSCOPY AND STENT PLACEMENT Left 01/17/2019   Procedure: CYSTOSCOPY WITH RETROGRADE PYELOGRAM, URETEROSCOPY AND STENT PLACEMENT;  Surgeon: Abbie Sons, MD;  Location: [facility];  Service: Urology;  Laterality: Left;   CYSTOSCOPY/URETEROSCOPY/HOLMIUM LASER/STENT PLACEMENT Right 06/29/2019   Procedure: CYSTOSCOPY/URETEROSCOPY/HOLMIUM LASER/STENT PLACEMENT;  Surgeon: Billey Co, MD;  Location: [facility];  Service: Urology;  Laterality: Right;   EXTRACORPOREAL SHOCK WAVE LITHOTRIPSY Right 06/01/2019  Procedure: EXTRACORPOREAL SHOCK WAVE LITHOTRIPSY (ESWL);  Surgeon: Billey Co, MD;  Location: [facility];  Service: Urology;  Laterality: Right;   kidney stone removal  01/21, 05/21, 06/21   LAPAROSCOPY N/A 08/24/2013   Procedure: LAPAROSCOPY OPERATIVE with lysis of adhesions ;  Surgeon: Marylynn Pearson, MD;  Location: [facility];  Service: Gynecology;  Laterality: N/A;   STONE EXTRACTION WITH BASKET Left 01/17/2019   Procedure: STONE EXTRACTION  WITH BASKET;  Surgeon: Abbie Sons, MD;  Location: [facility];  Service: Urology;  Laterality: Left;   wrist, cyst removal      Family History  Problem Relation Age of Onset   Aneurysm Mother    Blindness Father    High blood pressure Father    Hypertension Sister    Hypertension Sister    Diabetes Neg Hx     Social History   Socioeconomic History   Marital status: Single    Spouse name: Not on file   Number of children: 1   Years of education: GED   Highest education level: Not on file  Occupational History    Employer: [employer]  Tobacco Use   Smoking status: Former    Types: E-cigarettes    Passive exposure: Past   Smokeless tobacco: Never   Tobacco comments:    History of vaping.  Did not vape daily.  Did socially.  Started 2020, Stopped in 2022.    Vaping Use   Vaping Use: Some days   Substances: CBD  Substance and Sexual Activity   Alcohol use: Not Currently    Alcohol/week: 1.0 standard drink of alcohol    Types: 1 Standard drinks or equivalent per week    Comment: WINE OCC   Drug use: Not Currently    Types: Marijuana    Comment: last use 05/29/19   Sexual activity: Not Currently    Birth control/protection: I.U.D.  Other Topics Concern   Not on file  Social History Narrative   Patient is single and she takes care of her daughter. Patient works full time Web designer.   Education GED.   Right handed.   Caffeine one cup of coffee daily and soda daily.   Children One       Social Determinants of Health   Financial Resource Strain: Not on file  Food Insecurity: Not on file  Transportation Needs: Not on file  Physical Activity: Not on file  Stress: Not on file  Social Connections: Not on file  Intimate Partner Violence: Not on file    ROS Review of Systems  Constitutional:  Negative for chills and fever.  Eyes:  Negative for visual disturbance.  Respiratory:  Positive for shortness of breath. Negative for cough and wheezing.    Cardiovascular:  Negative for chest pain.  Gastrointestinal:  Negative for abdominal pain.    Objective:   Today's Vitals: There were no vitals taken for this visit.  Physical Exam Vitals reviewed.  Constitutional:      Appearance: Normal appearance. She is well-developed.  HENT:     Head: Normocephalic and atraumatic.  Eyes:     Conjunctiva/sclera: Conjunctivae normal.  Cardiovascular:     Rate and Rhythm: Normal rate and regular rhythm.  Pulmonary:     Effort: Pulmonary effort is normal.     Breath sounds: Normal breath sounds. No wheezing, rhonchi or rales.  Musculoskeletal:     Right lower leg: No edema.     Left lower leg: No edema.  Skin:  General: Skin is warm and dry.  Neurological:     General: No focal deficit present.     Mental Status: She is alert. Mental status is at baseline.  Psychiatric:        Mood and Affect: Mood normal.        Behavior: Behavior normal.     Assessment & Plan:   1. Type 2 diabetes mellitus without complication, unspecified whether long term insulin use ([place]): A1c slightly increased today at 7.4%.  Patient states her sugars have been running a little bit higher lately after she had a bad stomach virus.  No changes to medications made today continue metformin 1000 mg twice daily and Levemir 40 units in the morning. Recheck A1c in 3 months.   - POCT HgB A1C  2. Depression with anxiety: Depression screening high today.  Has been on multiple SSRIs in the past, but cannot remember which ones or any side effects other than the Cymbalta and Lexapro.  Will start Prozac 10 mg daily.  Plan to recheck in 6 weeks.  - FLUoxetine (PROZAC) 10 MG tablet; Take 1 tablet (10 mg total) by mouth daily.  Dispense: 30 tablet; Refill: 1  3. Shortness of breath: Most likely secondary to asthma, has a pulmonary appointment on 8/11 for PFTs.  In the meantime I will order albuterol nebulizer and breathing machine to use as needed.  She will also be started on  a daily steroid inhaler as she is having frequent nocturnal symptoms.  - albuterol (PROVENTIL) (2.5 MG/3ML) 0.083% nebulizer solution; Take 3 mLs (2.5 mg total) by nebulization every 6 (six) hours as needed for wheezing or shortness of breath.  Dispense: 150 mL; Refill: 1 - For home use only DME Other see comment - beclomethasone (QVAR REDIHALER) 40 MCG/ACT inhaler; Inhale 1 puff into the lungs 2 (two) times daily.  Dispense: 1 each; Refill: 1  4. Acute midline thoracic back pain: Over-the-counter anti-inflammatories not as effective, will use naproxen 500 mg twice daily x10 days.  She will not take any other anti-inflammatories while on this medication will take it with food.  - naproxen (NAPROSYN) 500 MG tablet; Take 1 tablet (500 mg total) by mouth 2 (two) times daily with a meal for 10 days.  Dispense: 20 tablet; Refill: 0   Follow-up: No follow-ups on file.   Teodora Medici, DO

## 2022-02-03 ENCOUNTER — Ambulatory Visit: Payer: BC Managed Care – PPO | Admitting: Internal Medicine

## 2022-02-03 ENCOUNTER — Encounter: Payer: Self-pay | Admitting: Internal Medicine

## 2022-02-03 VITALS — BP 136/86 | HR 110 | Temp 98.4°F | Resp 16 | Ht 64.0 in | Wt 183.5 lb

## 2022-02-03 DIAGNOSIS — G4700 Insomnia, unspecified: Secondary | ICD-10-CM | POA: Diagnosis not present

## 2022-02-03 DIAGNOSIS — Z23 Encounter for immunization: Secondary | ICD-10-CM

## 2022-02-03 DIAGNOSIS — R0602 Shortness of breath: Secondary | ICD-10-CM | POA: Diagnosis not present

## 2022-02-03 DIAGNOSIS — E119 Type 2 diabetes mellitus without complications: Secondary | ICD-10-CM

## 2022-02-03 LAB — POCT GLYCOSYLATED HEMOGLOBIN (HGB A1C): Hemoglobin A1C: 8 % — AB (ref 4.0–5.6)

## 2022-02-03 MED ORDER — TRAZODONE HCL 50 MG PO TABS: 25.0000 mg | ORAL_TABLET | Freq: Every evening | ORAL | 2 refills | Status: DC | PRN

## 2022-02-03 MED ORDER — INSULIN DETEMIR 100 UNIT/ML ~~LOC~~ SOLN: 25.0000 [IU] | Freq: Two times a day (BID) | SUBCUTANEOUS | 3 refills | Status: DC

## 2022-02-03 MED ORDER — ALBUTEROL SULFATE (2.5 MG/3ML) 0.083% IN NEBU: 2.5000 mg | INHALATION_SOLUTION | Freq: Four times a day (QID) | RESPIRATORY_TRACT | 1 refills | Status: DC | PRN

## 2022-02-03 NOTE — Patient Instructions (Addendum)
It was great seeing you today!  Plan discussed at today's visit: -A1c 8.0% work on diet and decreasing sugar and carbs -Increase Levemir to 25 units twice a day - please check fasting sugar and 2 hours after eating largest meal. Write these down and bring to follow up -Prevnar 20 vaccine today -Trazodone 50 mg sent to pharmacy to take 30 minutes prior to bedtime   Follow up in: 3 months   Take care and let us know if you have any questions or concerns prior to your next visit.  Dr. Rosana Berger

## 2022-02-25 ENCOUNTER — Other Ambulatory Visit: Payer: Self-pay | Admitting: Internal Medicine

## 2022-02-25 MED ORDER — METFORMIN HCL 1000 MG PO TABS
1000.0000 mg | ORAL_TABLET | Freq: Two times a day (BID) | ORAL | 0 refills | Status: DC
Start: 2022-02-25 — End: 2022-05-19

## 2022-02-27 ENCOUNTER — Other Ambulatory Visit: Payer: Self-pay | Admitting: Internal Medicine

## 2022-03-02 ENCOUNTER — Encounter: Payer: Self-pay | Admitting: Internal Medicine

## 2022-03-17 ENCOUNTER — Other Ambulatory Visit: Payer: Self-pay | Admitting: Internal Medicine

## 2022-03-17 DIAGNOSIS — E119 Type 2 diabetes mellitus without complications: Secondary | ICD-10-CM

## 2022-03-17 MED ORDER — INSULIN DETEMIR 100 UNIT/ML ~~LOC~~ SOLN: 25.0000 [IU] | Freq: Two times a day (BID) | SUBCUTANEOUS | 3 refills | Status: DC

## 2022-04-08 ENCOUNTER — Other Ambulatory Visit: Payer: Self-pay | Admitting: Internal Medicine

## 2022-04-08 DIAGNOSIS — R0602 Shortness of breath: Secondary | ICD-10-CM

## 2022-04-08 NOTE — Telephone Encounter (Signed)
Unable to refill per protocol, Rx expired. Discontinued 08/05/21.  Requested Prescriptions  Pending Prescriptions Disp Refills   albuterol (VENTOLIN HFA) 108 (90 Base) MCG/ACT inhaler [Pharmacy Med Name: Albuterol Sulfate HFA 108 (90 Base) MCG/ACT Inhalation Aerosol Solution] 18 g 0    Sig: INHALE 2 PUFFS BY MOUTH EVERY 6 HOURS AS NEEDED FOR WHEEZING FOR SHORTNESS OF BREATH     Pulmonology:  Beta Agonists 2 Passed - 04/08/2022 12:20 PM      Passed - Last BP in normal range    BP Readings from Last 1 Encounters:  02/03/22 136/86         Passed - Last Heart Rate in normal range    Pulse Readings from Last 1 Encounters:  02/03/22 (!) 110         Passed - Valid encounter within last 12 months    Recent Outpatient Visits           2 months ago Type 2 diabetes mellitus without complication, unspecified whether long term insulin use ([place])   [facility] Teodora Medici, DO   8 months ago Type 2 diabetes mellitus without complication, unspecified whether long term insulin use [facility])   [facility] Teodora Medici, DO   10 months ago Rash of both feet   [place], DO   1 year ago Type 2 diabetes mellitus without complication, unspecified whether long term insulin use [facility])   [facility] Teodora Medici, DO       Future Appointments             In 5 months McGowan, Shannon A, [place]

## 2022-04-14 ENCOUNTER — Encounter: Payer: Self-pay | Admitting: Physician Assistant

## 2022-04-14 ENCOUNTER — Ambulatory Visit: Payer: BC Managed Care – PPO | Admitting: Physician Assistant

## 2022-04-14 ENCOUNTER — Other Ambulatory Visit (HOSPITAL_COMMUNITY)
Admission: RE | Admit: 2022-04-14 | Discharge: 2022-04-14 | Disposition: A | Discharge status: Home / Self Care | Payer: BC Managed Care – PPO | Source: Ambulatory Visit | Attending: Physician Assistant | Admitting: Physician Assistant

## 2022-04-14 VITALS — BP 118/76 | HR 96 | Temp 98.1°F | Resp 16 | Ht 64.0 in | Wt 188.9 lb

## 2022-04-14 DIAGNOSIS — B9689 Other specified bacterial agents as the cause of diseases classified elsewhere: Secondary | ICD-10-CM

## 2022-04-14 DIAGNOSIS — N760 Acute vaginitis: Secondary | ICD-10-CM

## 2022-04-14 DIAGNOSIS — K5900 Constipation, unspecified: Secondary | ICD-10-CM | POA: Insufficient documentation

## 2022-04-14 DIAGNOSIS — R300 Dysuria: Secondary | ICD-10-CM

## 2022-04-14 DIAGNOSIS — R1030 Lower abdominal pain, unspecified: Secondary | ICD-10-CM

## 2022-04-14 DIAGNOSIS — R141 Gas pain: Secondary | ICD-10-CM | POA: Insufficient documentation

## 2022-04-14 DIAGNOSIS — K625 Hemorrhage of anus and rectum: Secondary | ICD-10-CM | POA: Insufficient documentation

## 2022-04-14 DIAGNOSIS — R194 Change in bowel habit: Secondary | ICD-10-CM | POA: Insufficient documentation

## 2022-04-14 NOTE — Progress Notes (Signed)
Acute Office Visit   Patient: Rose [name]   DOB: May 10, 1975   47 y.o. Female  MRN: [id number] Visit Date: 04/14/2022  Today's healthcare provider: Oswaldo Conroy Elika Godar, PA-C  Introduced myself to the patient as a Secondary school teacher and provided education on APPs in clinical practice.    Chief Complaint  Patient presents with   Abdominal Pain   Urinary Tract Infection   Subjective    HPI   Concern for UTI  Onset: sudden  Duration: Lower abdominal pain started around Thurs  Associated symptoms: suprapubic pain that has subsided, flank pain that has resolved  She denies dysuria, increased frequency, urgency, hematuria  She reports she had worn a tight girdle for about 3 days prior to suprapubic pain   Interventions: She has taken AZO, her last dose was this AM around 4   Vulvovaginal symptoms: none   She reports she does get yeast infections when taking Abx so she will need diflucan if she requires antibiotic therapy    Medications: Outpatient Medications Prior to Visit  Medication Sig   albuterol (PROVENTIL) (2.5 MG/3ML) 0.083% nebulizer solution Take 3 mLs (2.5 mg total) by nebulization every 6 (six) hours as needed for wheezing or shortness of breath.   Ascorbic Acid (VITAMIN C) 1000 MG tablet Take 2,000 mg by mouth daily.   Carboxymethylcellul-Glycerin (LUBRICATING EYE DROPS OP) Place 1 drop into both eyes daily as needed (dry eyes).   cholecalciferol (VITAMIN D3) 25 MCG (1000 UNIT) tablet Take 3,000 Units by mouth daily.   EPINEPHrine 0.3 mg/0.3 mL IJ SOAJ injection Inject into the muscle.   glucose blood (ONE TOUCH ULTRA TEST) test strip 1 each by Other route daily. And lancets 1/day 250.00   insulin detemir (LEVEMIR) 100 UNIT/ML injection Inject 0.25 mLs (25 Units total) into the skin 2 (two) times daily.   Insulin Syringe-Needle U-100 (SAFETY INSULIN SYRINGES) 30G X 5/16" 0.5 ML MISC 1 Syringe by Does not apply route daily.   levonorgestrel (MIRENA) 20 MCG/24HR IUD 1 each  by Intrauterine route once.   metFORMIN (GLUCOPHAGE) 1000 MG tablet Take 1 tablet (1,000 mg total) by mouth 2 (two) times daily.   Multiple Vitamin (ONE-A-DAY ESSENTIAL PO) Take 1 tablet by mouth daily.   omeprazole (PRILOSEC) 20 MG capsule Take 1 capsule (20 mg total) by mouth daily as needed (reflux).   rosuvastatin (CRESTOR) 5 MG tablet Take 1 tablet (5 mg total) by mouth daily.   traZODone (DESYREL) 50 MG tablet Take 0.5-1 tablets (25-50 mg total) by mouth at bedtime as needed for sleep.   zinc gluconate 50 MG tablet Take 50 mg by mouth daily.   No facility-administered medications prior to visit.    Review of Systems  Gastrointestinal:  Positive for abdominal pain (resolving).  Genitourinary:  Negative for decreased urine volume, difficulty urinating, dysuria, flank pain, frequency, hematuria, urgency, vaginal bleeding, vaginal discharge and vaginal pain.    {Labs  Heme  Chem  Endocrine  Serology  Results Review (optional):23779}   Objective    BP 118/76 (BP Location: Right Arm, Patient Position: Sitting, Cuff Size: Normal)   Pulse 96   Temp 98.1 F (36.7 C) (Oral)   Resp 16   Ht 5\' 4"  (1.626 m)   Wt 188 lb 14.4 oz (85.7 kg)   SpO2 100%   BMI 32.42 kg/m  {Show previous vital signs (optional):23777}  Physical Exam Vitals reviewed.  Constitutional:      General: She is  awake.     Appearance: Normal appearance. She is well-developed.  HENT:     Head: Normocephalic and atraumatic.  Neurological:     Mental Status: She is alert.  Psychiatric:        Behavior: Behavior is cooperative.       No results found for any visits on 04/14/22.  Assessment & Plan      No follow-ups on file.

## 2022-04-15 LAB — URINE CULTURE
MICRO NUMBER:: 14802557
SPECIMEN QUALITY:: ADEQUATE

## 2022-04-16 LAB — CERVICOVAGINAL ANCILLARY ONLY
Bacterial Vaginitis (gardnerella): POSITIVE — AB
Candida Glabrata: NEGATIVE
Candida Vaginitis: NEGATIVE
Chlamydia: NEGATIVE
Comment: NEGATIVE
Comment: NEGATIVE
Comment: NEGATIVE
Comment: NEGATIVE
Comment: NEGATIVE
Comment: NORMAL
Neisseria Gonorrhea: NEGATIVE
Trichomonas: NEGATIVE

## 2022-04-17 MED ORDER — METRONIDAZOLE 500 MG PO TABS: 500.0000 mg | ORAL_TABLET | Freq: Two times a day (BID) | ORAL | 0 refills | Status: DC

## 2022-04-17 NOTE — Progress Notes (Signed)
Your cervicovaginal swab was positive for bacterial vaginosis. I have sent in a script for Flagyl to help resolve this. Please take this by mouth twice per day for 7 days. It is important to abstain from consuming alcohol while taking this medication as this combination can cause severe nausea and vomiting. Your urine culture did not have significant bacterial growth - I think we should treat the bacterial vaginosis for now and if you are still having symptoms we can recheck a urine sample.

## 2022-04-17 NOTE — Addendum Note (Signed)
Addended by: Jacquelin Hawking on: 04/17/2022 12:09 PM   Modules accepted: Orders

## 2022-04-22 ENCOUNTER — Encounter: Payer: Self-pay | Admitting: Internal Medicine

## 2022-04-22 DIAGNOSIS — R0602 Shortness of breath: Secondary | ICD-10-CM

## 2022-04-22 MED ORDER — ALBUTEROL SULFATE (2.5 MG/3ML) 0.083% IN NEBU: 2.5000 mg | INHALATION_SOLUTION | Freq: Four times a day (QID) | RESPIRATORY_TRACT | 1 refills | Status: DC | PRN

## 2022-04-23 ENCOUNTER — Other Ambulatory Visit: Payer: Self-pay | Admitting: Nurse Practitioner

## 2022-04-23 DIAGNOSIS — J4520 Mild intermittent asthma, uncomplicated: Secondary | ICD-10-CM

## 2022-04-23 MED ORDER — ALBUTEROL SULFATE HFA 108 (90 BASE) MCG/ACT IN AERS: 2.0000 | INHALATION_SPRAY | Freq: Four times a day (QID) | RESPIRATORY_TRACT | 0 refills | Status: DC | PRN

## 2022-04-30 NOTE — Progress Notes (Signed)
[name], female    DOB: 1975-05-01, 47 y.o.   MRN: [id number]  PCP: Margarita Mail, DO  Chief Complaint  Patient presents with   Cough    Pt states feels chest heavy but does not hurt. A lot of phlegm onset for over a week on/off. Pt states notice a blood clot when she coughed up some phlegm about a week ago.    Subjective:   Rose Dean Colao is a 47 y.o. female, presents to clinic with CC of the following:  Coughing up phlegm and uri sx started a few weeks ago Productive cough with mucous and little flicks of bloods (one time only) Not improved with inhaler, zyrtec, benadryl   Asthma She complains of chest tightness, cough, shortness of breath, sputum production and wheezing. There is no frequent throat clearing or hoarse voice. The problem has been unchanged. Pertinent negatives include no appetite change, chest pain, dyspnea on exertion, ear congestion, ear pain, fever, headaches, heartburn, malaise/fatigue, myalgias, nasal congestion, orthopnea, PND, postnasal drip, rhinorrhea, sneezing, sore throat, sweats, trouble swallowing or weight loss. Her past medical history is significant for asthma.  URI  Associated symptoms include coughing and wheezing. Pertinent negatives include no chest pain, ear pain, headaches, rhinorrhea, sneezing or sore throat.      Patient Active Problem List   Diagnosis Date Noted   Change in bowel habit 04/14/2022   Constipation 04/14/2022   Flatulence, eructation and gas pain 04/14/2022   Hemorrhage of rectum and anus 04/14/2022   DOE (dyspnea on exertion) 08/15/2021   Pyelonephritis 07/15/2020   History of kidney stones    GERD (gastroesophageal reflux disease)    Family history of high cholesterol 03/31/2017   Family history of hypertension 03/31/2017   IUD (intrauterine device) in place 03/31/2017   Hyperlipidemia 06/05/2016   Neck pain 09/19/2013   Right leg pain    Diabetes (HCC) 09/18/2013   Mood disorder  (HCC) 04/20/2013   Mild intermittent asthma 12/06/2012   Diabetes mellitus type II, uncontrolled 12/06/2012   Depression with anxiety 12/06/2012      Current Outpatient Medications:    albuterol (VENTOLIN HFA) 108 (90 Base) MCG/ACT inhaler, Inhale 2 puffs into the lungs every 6 (six) hours as needed for wheezing or shortness of breath., Disp: 8 g, Rfl: 0   Ascorbic Acid (VITAMIN C) 1000 MG tablet, Take 2,000 mg by mouth daily., Disp: , Rfl:    Carboxymethylcellul-Glycerin (LUBRICATING EYE DROPS OP), Place 1 drop into both eyes daily as needed (dry eyes)., Disp: , Rfl:    cholecalciferol (VITAMIN D3) 25 MCG (1000 UNIT) tablet, Take 3,000 Units by mouth daily., Disp: , Rfl:    EPINEPHrine 0.3 mg/0.3 mL IJ SOAJ injection, Inject into the muscle., Disp: , Rfl:    glucose blood (ONE TOUCH ULTRA TEST) test strip, 1 each by Other route daily. And lancets 1/day 250.00, Disp: 100 each, Rfl: 12   insulin detemir (LEVEMIR) 100 UNIT/ML injection, Inject 0.25 mLs (25 Units total) into the skin 2 (two) times daily., Disp: 10 mL, Rfl: 3   Insulin Syringe-Needle U-100 (SAFETY INSULIN SYRINGES) 30G X 5/16" 0.5 ML MISC, 1 Syringe by Does not apply route daily., Disp: 30 each, Rfl: 3   levonorgestrel (MIRENA) 20 MCG/24HR IUD, 1 each by Intrauterine route once., Disp: , Rfl:    metFORMIN (GLUCOPHAGE) 1000 MG tablet, Take 1 tablet (1,000 mg total) by mouth 2 (two) times daily., Disp: 180 tablet, Rfl: 0   metroNIDAZOLE (FLAGYL)  500 MG tablet, Take 1 tablet (500 mg total) by mouth 2 (two) times daily., Disp: 14 tablet, Rfl: 0   Multiple Vitamin (ONE-A-DAY ESSENTIAL PO), Take 1 tablet by mouth daily., Disp: , Rfl:    omeprazole (PRILOSEC) 20 MG capsule, Take 1 capsule (20 mg total) by mouth daily as needed (reflux)., Disp: 90 capsule, Rfl: 3   rosuvastatin (CRESTOR) 5 MG tablet, Take 1 tablet (5 mg total) by mouth daily., Disp: 90 tablet, Rfl: 3   traZODone (DESYREL) 50 MG tablet, Take 0.5-1 tablets (25-50 mg total)  by mouth at bedtime as needed for sleep., Disp: 30 tablet, Rfl: 2   zinc gluconate 50 MG tablet, Take 50 mg by mouth daily., Disp: , Rfl:    Allergies  Allergen Reactions   Bactrim [Sulfamethoxazole-Trimethoprim] Swelling   Neurontin [Gabapentin] Itching   Sulfa Antibiotics Other (See Comments)     Social History   Tobacco Use   Smoking status: Former    Types: E-cigarettes    Passive exposure: Past   Smokeless tobacco: Never   Tobacco comments:    History of vaping.  Did not vape daily.  Did socially.  Started 2020, Stopped in 2022.    Vaping Use   Vaping Use: Some days   Substances: CBD  Substance Use Topics   Alcohol use: Not Currently    Alcohol/week: 1.0 standard drink of alcohol    Types: 1 Standard drinks or equivalent per week    Comment: WINE OCC   Drug use: Not Currently    Types: Marijuana    Comment: last use 05/29/19      Chart Review Today: I personally reviewed active problem list, medication list, allergies, family history, social history, health maintenance, notes from last encounter, lab results, imaging with the patient/caregiver today.   Review of Systems  Constitutional: Negative.  Negative for appetite change, fever, malaise/fatigue and weight loss.  HENT: Negative.  Negative for ear pain, hoarse voice, postnasal drip, rhinorrhea, sneezing, sore throat and trouble swallowing.   Eyes: Negative.   Respiratory:  Positive for cough, sputum production, shortness of breath and wheezing.   Cardiovascular: Negative.  Negative for chest pain, dyspnea on exertion and PND.  Gastrointestinal: Negative.  Negative for heartburn.  Endocrine: Negative.   Genitourinary: Negative.   Musculoskeletal: Negative.  Negative for myalgias.  Skin: Negative.   Allergic/Immunologic: Negative.   Neurological: Negative.  Negative for headaches.  Hematological: Negative.   Psychiatric/Behavioral: Negative.    All other systems reviewed and are negative.      Objective:    Vitals:   05/01/22 1103  BP: 136/82  Pulse: 94  Resp: 16  Temp: 98.1 F (36.7 C)  TempSrc: Oral  SpO2: 98%  Weight: 180 lb 1.6 oz (81.7 kg)  Height: 5\' 4"  (1.626 m)    Body mass index is 30.91 kg/m.  Physical Exam Vitals and nursing note reviewed.  Constitutional:      General: She is not in acute distress.    Appearance: Normal appearance. She is well-developed. She is not ill-appearing, toxic-appearing or diaphoretic.  HENT:     Head: Normocephalic and atraumatic.     Right Ear: External ear normal.     Left Ear: External ear normal.     Nose: Congestion and rhinorrhea present.     Mouth/Throat:     Pharynx: Oropharynx is clear. Uvula midline. No oropharyngeal exudate or posterior oropharyngeal erythema.  Eyes:     General: Lids are normal. No scleral icterus.  Right eye: No discharge.        Left eye: No discharge.     Conjunctiva/sclera: Conjunctivae normal.  Neck:     Trachea: Phonation normal. No tracheal deviation.  Cardiovascular:     Rate and Rhythm: Normal rate and regular rhythm.     Pulses: Normal pulses.          Radial pulses are 2+ on the right side and 2+ on the left side.       Posterior tibial pulses are 2+ on the right side and 2+ on the left side.     Heart sounds: Normal heart sounds. No murmur heard.    No friction rub. No gallop.  Pulmonary:     Effort: Pulmonary effort is normal. No tachypnea, accessory muscle usage, respiratory distress or retractions.     Breath sounds: Normal breath sounds. No stridor, decreased air movement or transmitted upper airway sounds. No decreased breath sounds, wheezing, rhonchi or rales.  Chest:     Chest wall: No tenderness.  Abdominal:     General: Bowel sounds are normal. There is no distension.     Palpations: Abdomen is soft.     Tenderness: There is no abdominal tenderness. There is no guarding or rebound.  Musculoskeletal:        General: No deformity. Normal range of motion.     Cervical back:  Normal range of motion and neck supple.  Lymphadenopathy:     Cervical: No cervical adenopathy.  Skin:    General: Skin is warm and dry.     Capillary Refill: Capillary refill takes less than 2 seconds.     Coloration: Skin is not pale.     Findings: No rash.  Neurological:     Mental Status: She is alert and oriented to person, place, and time.     Motor: No abnormal muscle tone.     Gait: Gait normal.  Psychiatric:        Speech: Speech normal.        Behavior: Behavior normal.      Results for orders placed or performed in visit on 04/14/22  Urine Culture   Specimen: Urine  Result Value Ref Range   MICRO NUMBER: [id number]    SPECIMEN QUALITY: Adequate    Sample Source URINE    STATUS: FINAL    Result:      Less than 10,000 CFU/mL of single Gram positive organism isolated. No further testing will be performed. If clinically indicated, recollection using a method to minimize contamination, with prompt transfer to Urine Culture Transport Tube, is recommended.  Cervicovaginal ancillary only  Result Value Ref Range   Neisseria Gonorrhea Negative    Chlamydia Negative    Trichomonas Negative    Bacterial Vaginitis (gardnerella) Positive (A)    Candida Vaginitis Negative    Candida Glabrata Negative    Comment      Normal Reference Range Bacterial Vaginosis - Negative   Comment Normal Reference Range Candida Species - Negative    Comment Normal Reference Range Candida Galbrata - Negative    Comment Normal Reference Range Trichomonas - Negative    Comment Normal Reference Ranger Chlamydia - Negative    Comment      Normal Reference Range Neisseria Gonorrhea - Negative       Assessment & Plan:     ICD-10-CM   1. Shortness of breath  R06.02 DG Chest 2 View    ipratropium-albuterol (DUONEB) 0.5-2.5 (3) MG/3ML SOLN   no  increased WOB or distress on exam, lungs CTA A&P, pt concerned so adding CXR    2. DOE (dyspnea on exertion)  R06.09 DG Chest 2 View     ipratropium-albuterol (DUONEB) 0.5-2.5 (3) MG/3ML SOLN   "something just not right" tight hard to breath, albuterol inhaler doesnt help much, onset x 3+ weeks    3. Mild intermittent asthma with acute exacerbation  J45.21 ipratropium-albuterol (DUONEB) 0.5-2.5 (3) MG/3ML SOLN    predniSONE (DELTASONE) 20 MG tablet   unclear hx of asthma dx vs no asthma dx, she has inhalers only helping a little bit, with hx sounds consistent with asthma exacerbation    4. Chest pain, unspecified type  R07.9 DG Chest 2 View   intermitent right upper chest sharp pain comes and goes randomly - "feels like someone sticking a pin through my lung" CXR to eval    5. Rhinitis, unspecified type  J31.0    may be viral vs allergies - onset 3+ weeks ago, tx with antihistamines and nose sprays, no sinus TTP, no abx indicated      Pending CXR I explained to pt if neg we will continue with plan to treat like asthma exacerbation triggered either by virus or allergies plan is still the same, try duonebs - they have longer lasting med, may help more, steroids should make a big difference in a few days, mucinex, allergy meds recommended (per AVS: Try adding a claritin-D in the morning and keep a second allergy med also at night like your zyrtec)   possibly could add singulair later if needed? And f/up to re-eavaluate - she may need more controlling airway/allergy meds or different additional work up depending on her response  Pt verbalized understanding    Danelle Berry, PA-C 05/01/22 11:20 AM

## 2022-05-01 ENCOUNTER — Ambulatory Visit
Admission: RE | Admit: 2022-05-01 | Discharge: 2022-05-01 | Disposition: A | Discharge status: Home / Self Care | Payer: BC Managed Care – PPO | Source: Ambulatory Visit | Attending: Family Medicine | Admitting: Family Medicine

## 2022-05-01 ENCOUNTER — Ambulatory Visit
Admission: RE | Admit: 2022-05-01 | Discharge: 2022-05-01 | Disposition: A | Discharge status: Home / Self Care | Payer: BC Managed Care – PPO | Attending: Family Medicine | Admitting: Family Medicine

## 2022-05-01 ENCOUNTER — Encounter: Payer: Self-pay | Admitting: Family Medicine

## 2022-05-01 ENCOUNTER — Ambulatory Visit: Payer: BC Managed Care – PPO | Admitting: Family Medicine

## 2022-05-01 VITALS — BP 136/82 | HR 94 | Temp 98.1°F | Resp 16 | Ht 64.0 in | Wt 180.1 lb

## 2022-05-01 DIAGNOSIS — R0609 Other forms of dyspnea: Secondary | ICD-10-CM | POA: Insufficient documentation

## 2022-05-01 DIAGNOSIS — R0602 Shortness of breath: Secondary | ICD-10-CM | POA: Diagnosis not present

## 2022-05-01 DIAGNOSIS — R079 Chest pain, unspecified: Secondary | ICD-10-CM

## 2022-05-01 DIAGNOSIS — J4521 Mild intermittent asthma with (acute) exacerbation: Secondary | ICD-10-CM

## 2022-05-01 DIAGNOSIS — J310 Chronic rhinitis: Secondary | ICD-10-CM

## 2022-05-01 MED ORDER — PREDNISONE 20 MG PO TABS
40.0000 mg | ORAL_TABLET | Freq: Every day | ORAL | 0 refills | Status: DC
Start: 2022-05-01 — End: 2022-07-07

## 2022-05-01 MED ORDER — IPRATROPIUM-ALBUTEROL 0.5-2.5 (3) MG/3ML IN SOLN
3.0000 mL | Freq: Three times a day (TID) | RESPIRATORY_TRACT | 1 refills | Status: AC | PRN
Start: 2022-05-01 — End: ?

## 2022-05-01 NOTE — Patient Instructions (Signed)
Try adding a claritin-D in the morning and keep a second allergy med also at night like your zyrtec

## 2022-05-15 ENCOUNTER — Ambulatory Visit: Payer: BC Managed Care – PPO | Admitting: Internal Medicine

## 2022-05-19 ENCOUNTER — Other Ambulatory Visit: Payer: Self-pay | Admitting: Internal Medicine

## 2022-05-19 MED ORDER — METFORMIN HCL 1000 MG PO TABS: 1000.0000 mg | ORAL_TABLET | Freq: Two times a day (BID) | ORAL | 0 refills | Status: DC

## 2022-06-27 ENCOUNTER — Other Ambulatory Visit: Payer: Self-pay

## 2022-06-27 ENCOUNTER — Emergency Department: Payer: PRIVATE HEALTH INSURANCE

## 2022-06-27 ENCOUNTER — Emergency Department
Admission: EM | Admit: 2022-06-27 | Discharge: 2022-06-27 | Disposition: A | Discharge status: Home / Self Care | Payer: PRIVATE HEALTH INSURANCE | Attending: Student in an Organized Health Care Education/Training Program | Admitting: Student in an Organized Health Care Education/Training Program

## 2022-06-27 DIAGNOSIS — R102 Pelvic and perineal pain: Secondary | ICD-10-CM | POA: Diagnosis present

## 2022-06-27 DIAGNOSIS — E876 Hypokalemia: Secondary | ICD-10-CM | POA: Insufficient documentation

## 2022-06-27 DIAGNOSIS — E1165 Type 2 diabetes mellitus with hyperglycemia: Secondary | ICD-10-CM | POA: Insufficient documentation

## 2022-06-27 LAB — LIPASE, BLOOD: Lipase: 36 U/L (ref 11–51)

## 2022-06-27 LAB — COMPREHENSIVE METABOLIC PANEL WITH GFR
ALT: 13 U/L (ref 0–44)
AST: 17 U/L (ref 15–41)
Albumin: 3.8 g/dL (ref 3.5–5.0)
Alkaline Phosphatase: 40 U/L (ref 38–126)
Anion gap: 7 (ref 5–15)
BUN: 9 mg/dL (ref 6–20)
CO2: 25 mmol/L (ref 22–32)
Calcium: 8.3 mg/dL — ABNORMAL LOW (ref 8.9–10.3)
Chloride: 104 mmol/L (ref 98–111)
Creatinine, Ser: 0.79 mg/dL (ref 0.44–1.00)
GFR, Estimated: >60 mL/min
Glucose, Bld: 141 mg/dL — ABNORMAL HIGH (ref 70–99)
Potassium: 3.1 mmol/L — ABNORMAL LOW (ref 3.5–5.1)
Sodium: 136 mmol/L (ref 135–145)
Total Bilirubin: 0.5 mg/dL (ref 0.3–1.2)
Total Protein: 7.1 g/dL (ref 6.5–8.1)

## 2022-06-27 LAB — URINALYSIS, ROUTINE W REFLEX MICROSCOPIC
Bilirubin Urine: NEGATIVE
Glucose, UA: NEGATIVE mg/dL
Hgb urine dipstick: NEGATIVE
Ketones, ur: NEGATIVE mg/dL
Leukocytes,Ua: NEGATIVE
Nitrite: NEGATIVE
Protein, ur: NEGATIVE mg/dL
Specific Gravity, Urine: 1.003 — ABNORMAL LOW (ref 1.005–1.030)
pH: 6 (ref 5.0–8.0)

## 2022-06-27 LAB — CBC
HCT: 37.3 % (ref 36.0–46.0)
Hemoglobin: 11.8 g/dL — ABNORMAL LOW (ref 12.0–15.0)
MCH: 25.2 pg — ABNORMAL LOW (ref 26.0–34.0)
MCHC: 31.6 g/dL (ref 30.0–36.0)
MCV: 79.5 fL — ABNORMAL LOW (ref 80.0–100.0)
Platelets: 332 10*3/uL (ref 150–400)
RBC: 4.69 MIL/uL (ref 3.87–5.11)
RDW: 14.2 % (ref 11.5–15.5)
WBC: 5.9 10*3/uL (ref 4.0–10.5)
nRBC: 0 % (ref 0.0–0.2)

## 2022-06-27 LAB — POC URINE PREG, ED: Preg Test, Ur: NEGATIVE

## 2022-06-27 NOTE — ED Provider Notes (Signed)
[facility] Provider Note    Event Date/Time   First MD Initiated Contact with Patient 06/27/22 [phone]     (approximate)   History   Abdominal Pain   HPI  Rose [name] is a 47 y.o. female  with history of diabetes, GERD,  and as listed in EMR presents to the emergency department for treatment and evaluation of pelvic pain since last night. She has an IUD but recently had a menstrual cycle. She denies vaginal discharge or concern for STD. No fever, nausea, or vomiting. No urinary symptoms. .      Physical Exam   Triage Vital Signs: ED Triage Vitals  Enc Vitals Group     BP 06/27/22 0556 (!) 141/102     Pulse Rate 06/27/22 0556 82     Resp 06/27/22 0556 18     Temp 06/27/22 0556 98 F (36.7 C)     Temp Source 06/27/22 0556 Oral     SpO2 06/27/22 0556 97 %     Weight 06/27/22 0557 150 lb (68 kg)     Height 06/27/22 0557 5\' 4"  (1.626 m)     Head Circumference --      Peak Flow --      Pain Score 06/27/22 0556 8     Pain Loc --      Pain Edu? --      Excl. in GC? --     Most recent vital signs: Vitals:   06/27/22 0556  BP: (!) 141/102  Pulse: 82  Resp: 18  Temp: 98 F (36.7 C)  SpO2: 97%    General: Awake, no distress.  CV:  Good peripheral perfusion.  Resp:  Normal effort.  Abd:  No distention. Tenderness in lower abdomen bilaterally. No suprapubic tenderness. Other:     ED Results / Procedures / Treatments   Labs (all labs ordered are listed, but only abnormal results are displayed) Labs Reviewed  COMPREHENSIVE METABOLIC PANEL - Abnormal; Notable for the following components:      Result Value   Potassium 3.1 (*)    Glucose, Bld 141 (*)    Calcium 8.3 (*)    All other components within normal limits  CBC - Abnormal; Notable for the following components:   Hemoglobin 11.8 (*)    MCV 79.5 (*)    MCH 25.2 (*)    All other components within normal limits  URINALYSIS, ROUTINE W REFLEX MICROSCOPIC - Abnormal; Notable for  the following components:   Color, Urine STRAW (*)    APPearance CLEAR (*)    Specific Gravity, Urine 1.003 (*)    All other components within normal limits  LIPASE, BLOOD  POC URINE PREG, ED     EKG  Not indicated.   RADIOLOGY  Image and radiology report reviewed and interpreted by me. Radiology report consistent with the same.  (Pt left AMA prior to [place])  PROCEDURES:  Critical Care performed: No  Procedures   MEDICATIONS ORDERED IN ED:  Medications - No data to display   IMPRESSION / MDM / ASSESSMENT AND PLAN / ED COURSE   I have reviewed the triage note.  Differential diagnosis includes, but is not limited to, Ovarian cysts, displaced IUD, PID, UTI  Patient's presentation is most consistent with acute complicated illness / injury requiring diagnostic workup.  47 year old female presenting to the emergency department for treatment and evaluation of pelvic pain.  See HPI for further details.  Exam is overall reassuring with the  exception of some tenderness to the right and left pelvis.  No suprapubic tenderness.  Labs show a mild hypokalemia at 3.1 with a normal sodium and's slightly elevated glucose of 141.  CBC shows a normal white blood cell count, slight decrease in hemoglobin, MCV, MCH.  Lipase is normal.  Urinalysis is normal.  Pregnancy test is negative.  Results discussed with the patient.  Plan will be to get a pelvic ultrasound.  Patient agreeable to the plan.  Patient came out of the room requesting to leave despite not having had her pelvic ultrasound.  The ultrasound tech was actually just outside of the room but patient stated that she would then have to wait for the report and that she would prefer to go home.  I advised her that I was unable to rule out emergent conditions that might explain her pelvic pain.  She decided to leave anyway.      FINAL CLINICAL IMPRESSION(S) / ED DIAGNOSES   Final diagnoses:  Pelvic pain     Rx / DC Orders   ED  Discharge Orders     None        Note:  This document was prepared using Dragon voice recognition software and may include unintentional dictation errors.   Chinita Pester, FNP 06/27/22 1159    Willy Eddy, MD 06/27/22 [phone]

## 2022-06-27 NOTE — ED Notes (Signed)
Pt stated that she didn't want to wait for ultrasound and wanted to leave. Provider and RN made aware.

## 2022-06-27 NOTE — ED Notes (Signed)
Patient states she is tired and wants to lie down at home and have something to eat. Lorra Hals NP aware and has spoken with the patient. Patient opted to leave AMA.

## 2022-06-27 NOTE — ED Triage Notes (Signed)
Lower abd pain bilaterally. Described as a sharp pressure. Some radiation to pelvis. Reports recently completed her menstrual cycle. States no longer bleeding. Denies urinary symptoms. Denies vomiting or diarrhea but reports nausea. Pt ambulatory to triage. Alert and oriented following commands. Breathing unlabored speaking in full sentences with symmetric chest rise and fall.

## 2022-06-28 ENCOUNTER — Emergency Department: Payer: PRIVATE HEALTH INSURANCE

## 2022-06-28 ENCOUNTER — Other Ambulatory Visit: Payer: Self-pay

## 2022-06-28 ENCOUNTER — Emergency Department
Admission: EM | Admit: 2022-06-28 | Discharge: 2022-06-28 | Disposition: A | Discharge status: Home / Self Care | Payer: PRIVATE HEALTH INSURANCE | Attending: Emergency Medicine | Admitting: Emergency Medicine

## 2022-06-28 DIAGNOSIS — R109 Unspecified abdominal pain: Secondary | ICD-10-CM | POA: Diagnosis present

## 2022-06-28 DIAGNOSIS — J45909 Unspecified asthma, uncomplicated: Secondary | ICD-10-CM | POA: Insufficient documentation

## 2022-06-28 DIAGNOSIS — R102 Pelvic and perineal pain: Secondary | ICD-10-CM | POA: Insufficient documentation

## 2022-06-28 DIAGNOSIS — E119 Type 2 diabetes mellitus without complications: Secondary | ICD-10-CM | POA: Insufficient documentation

## 2022-06-28 DIAGNOSIS — R1032 Left lower quadrant pain: Secondary | ICD-10-CM | POA: Insufficient documentation

## 2022-06-28 LAB — URINALYSIS, ROUTINE W REFLEX MICROSCOPIC
Bacteria, UA: NONE SEEN
Bilirubin Urine: NEGATIVE
Glucose, UA: NEGATIVE mg/dL
Ketones, ur: 5 mg/dL — AB
Nitrite: NEGATIVE
Protein, ur: 100 mg/dL — AB
Specific Gravity, Urine: 1.031 — ABNORMAL HIGH (ref 1.005–1.030)
pH: 5 (ref 5.0–8.0)

## 2022-06-28 LAB — CHLAMYDIA/NGC RT PCR (ARMC ONLY)
Chlamydia Tr: NOT DETECTED
N gonorrhoeae: NOT DETECTED

## 2022-06-28 LAB — CBC
HCT: 36.5 % (ref 36.0–46.0)
Hemoglobin: 11.9 g/dL — ABNORMAL LOW (ref 12.0–15.0)
MCH: 25.8 pg — ABNORMAL LOW (ref 26.0–34.0)
MCHC: 32.6 g/dL (ref 30.0–36.0)
MCV: 79.2 fL — ABNORMAL LOW (ref 80.0–100.0)
Platelets: 333 10*3/uL (ref 150–400)
RBC: 4.61 MIL/uL (ref 3.87–5.11)
RDW: 14.5 % (ref 11.5–15.5)
WBC: 7.5 10*3/uL (ref 4.0–10.5)
nRBC: 0 % (ref 0.0–0.2)

## 2022-06-28 LAB — COMPREHENSIVE METABOLIC PANEL
ALT: 12 U/L (ref 0–44)
AST: 17 U/L (ref 15–41)
Albumin: 3.8 g/dL (ref 3.5–5.0)
Alkaline Phosphatase: 38 U/L (ref 38–126)
Anion gap: 7 (ref 5–15)
BUN: 11 mg/dL (ref 6–20)
CO2: 26 mmol/L (ref 22–32)
Calcium: 8.7 mg/dL — ABNORMAL LOW (ref 8.9–10.3)
Chloride: 106 mmol/L (ref 98–111)
Creatinine, Ser: 0.78 mg/dL (ref 0.44–1.00)
GFR, Estimated: >60 mL/min (ref >60)
Glucose, Bld: 128 mg/dL — ABNORMAL HIGH (ref 70–99)
Potassium: 3.4 mmol/L — ABNORMAL LOW (ref 3.5–5.1)
Sodium: 139 mmol/L (ref 135–145)
Total Bilirubin: 0.4 mg/dL (ref 0.3–1.2)
Total Protein: 7.2 g/dL (ref 6.5–8.1)

## 2022-06-28 LAB — LIPASE, BLOOD: Lipase: 30 U/L (ref 11–51)

## 2022-06-28 LAB — POC URINE PREG, ED: Preg Test, Ur: NEGATIVE

## 2022-06-28 MED ORDER — KETOROLAC TROMETHAMINE 30 MG/ML IJ SOLN
15.0000 mg | Freq: Once | INTRAMUSCULAR | Status: AC
  Administered 2022-06-28: 15 mg via INTRAVENOUS
  Filled 2022-06-28: qty 1

## 2022-06-28 MED ORDER — KETOROLAC TROMETHAMINE 15 MG/ML IJ SOLN
INTRAMUSCULAR | Status: AC
  Filled 2022-06-28: qty 1

## 2022-06-28 MED ORDER — IOHEXOL 300 MG/ML  SOLN
100.0000 mL | Freq: Once | INTRAMUSCULAR | Status: AC | PRN
  Administered 2022-06-28: 100 mL via INTRAVENOUS

## 2022-06-28 MED ORDER — ACETAMINOPHEN 325 MG PO TABS
650.0000 mg | ORAL_TABLET | Freq: Once | ORAL | Status: AC
  Administered 2022-06-28: 650 mg via ORAL
  Filled 2022-06-28: qty 2

## 2022-06-28 MED ORDER — HYDROCODONE-ACETAMINOPHEN 5-325 MG PO TABS: 1.0000 | ORAL_TABLET | ORAL | 0 refills | Status: DC | PRN

## 2022-06-28 MED ORDER — MORPHINE SULFATE (PF) 4 MG/ML IV SOLN: 4.0000 mg | Freq: Once | INTRAVENOUS | Status: DC

## 2022-06-28 MED ORDER — HYDROCODONE-ACETAMINOPHEN 5-325 MG PO TABS
1.0000 | ORAL_TABLET | Freq: Once | ORAL | Status: DC
  Filled 2022-06-28: qty 1

## 2022-06-28 NOTE — ED Triage Notes (Signed)
Pt co of lower abd pain.  Pt states when she was recently here all results came back normal but left AMA before an [place] was done. Pt denies n/v/diarrhea.

## 2022-06-28 NOTE — ED Provider Notes (Signed)
[facility] Provider Note    Event Date/Time   First MD Initiated Contact with Patient 06/28/22 1443     (approximate)   History   Abdominal Pain   HPI  Rose [name] is a 47 y.o. female past medical history of diabetes, anxiety, ADHD who presents with abdominal pain.  Symptoms started several days ago.  Patient endorses is a crampy/spasming type pain in the lower abdomen.  She points to both the suprapubic region and left lower quadrant left mid abdomen when asked about location of the pain.  Pain comes and goes.  She still eating or drinking no nausea vomiting.  Did feel she was somewhat constipated but last BM was yesterday.  No diarrhea.  Denies urinary symptoms.  She does have an IUD.  She had a menstrual period last week.  No vaginal bleeding currently.  Denies abnormal vaginal discharge.  Denies fevers or chills.  No history of similar pain.     Past Medical History:  Diagnosis Date   ADHD    Anxiety    Diabetes mellitus without complication (HCC)    Family history of adverse reaction to anesthesia    SISTER-TOO MUCH ANESTHESIA FOR C-SECTION   GERD (gastroesophageal reflux disease)    History of kidney stones    Neck pain    Right leg pain     Patient Active Problem List   Diagnosis Date Noted   Change in bowel habit 04/14/2022   Constipation 04/14/2022   Flatulence, eructation and gas pain 04/14/2022   Hemorrhage of rectum and anus 04/14/2022   DOE (dyspnea on exertion) 08/15/2021   Pyelonephritis 07/15/2020   History of kidney stones    GERD (gastroesophageal reflux disease)    Family history of high cholesterol 03/31/2017   Family history of hypertension 03/31/2017   IUD (intrauterine device) in place 03/31/2017   Hyperlipidemia 06/05/2016   Neck pain 09/19/2013   Right leg pain    Diabetes (HCC) 09/18/2013   Mood disorder (HCC) 04/20/2013   Mild intermittent asthma 12/06/2012   Diabetes mellitus type II, uncontrolled  12/06/2012   Depression with anxiety 12/06/2012     Physical Exam  Triage Vital Signs: ED Triage Vitals [06/28/22 1404]  Enc Vitals Group     BP 122/82     Pulse Rate (!) 109     Resp 18     Temp 98.5 F (36.9 C)     Temp Source Oral     SpO2 95 %     Weight      Height      Head Circumference      Peak Flow      Pain Score 8     Pain Loc      Pain Edu?      Excl. in GC?     Most recent vital signs: Vitals:   06/28/22 1404  BP: 122/82  Pulse: (!) 109  Resp: 18  Temp: 98.5 F (36.9 C)  SpO2: 95%     General: Awake, no distress.  CV:  Good peripheral perfusion.  Resp:  Normal effort.  Abd:  No distention.  Abdomen there is soft there is tenderness in the suprapubic region right lower quadrant and left lower quadrant but there is no guarding Neuro:             Awake, Alert, Oriented x 3  Other:     ED Results / Procedures / Treatments  Labs (all labs ordered are  listed, but only abnormal results are displayed) Labs Reviewed  COMPREHENSIVE METABOLIC PANEL - Abnormal; Notable for the following components:      Result Value   Potassium 3.4 (*)    Glucose, Bld 128 (*)    Calcium 8.7 (*)    All other components within normal limits  CBC - Abnormal; Notable for the following components:   Hemoglobin 11.9 (*)    MCV 79.2 (*)    MCH 25.8 (*)    All other components within normal limits  URINALYSIS, ROUTINE W REFLEX MICROSCOPIC - Abnormal; Notable for the following components:   Color, Urine AMBER (*)    APPearance HAZY (*)    Specific Gravity, Urine 1.031 (*)    Hgb urine dipstick SMALL (*)    Ketones, ur 5 (*)    Protein, ur 100 (*)    Leukocytes,Ua TRACE (*)    All other components within normal limits  CHLAMYDIA/NGC RT PCR ([facility] ONLY)            LIPASE, BLOOD  POC URINE PREG, ED     EKG     RADIOLOGY    PROCEDURES:  Critical Care performed: No  Procedures    MEDICATIONS ORDERED IN ED: Medications  ketorolac (TORADOL) 30 MG/ML  injection 15 mg (15 mg Intravenous Given 06/28/22 1537)  iohexol (OMNIPAQUE) 300 MG/ML solution 100 mL (100 mLs Intravenous Contrast Given 06/28/22 1627)  ketorolac (TORADOL) 30 MG/ML injection 15 mg (15 mg Intravenous Given 06/28/22 1722)  acetaminophen (TYLENOL) tablet 650 mg (650 mg Oral Given 06/28/22 2100)  ketorolac (TORADOL) 30 MG/ML injection 15 mg (15 mg Intravenous Given 06/28/22 2100)     IMPRESSION / MDM / ASSESSMENT AND PLAN / ED COURSE  I reviewed the triage vital signs and the nursing notes.                              Patient's presentation is most consistent with acute complicated illness / injury requiring diagnostic workup.  Differential diagnosis includes, but is not limited to, diverticulitis, enteritis, colitis, appendicitis, pyonephritis, kidney stone, ovarian cyst/torsion  48 year old female who presents with lower abdominal pain.  Symptoms started several days ago but are worsening.  She was actually here in the ED yesterday had labs and urinalysis and hCG that were all reassuring.  Plan was to obtain a pelvic ultrasound but patient left because she needed to eat because she is diabetic.  Returns today with ongoing pain.  Patient describes a spasming pain that involves both the suprapubic region and bilateral lower quadrants but is worse on the left.  She did have some constipation earlier but had a normal BM yesterday.  She is not having fevers or urinary symptoms or vaginal discharge.  Last menstrual period was about a week ago.  She has an IUD.  Patient is mildly tachycardic on arrival.  Overall she looks well.  Abdominal exam is overall benign but she is tender in both lower quadrants and in the suprapubic region.  Labs from today are also reassuring.  hCG was negative yesterday.  Urinalysis did not have any pyuria or hematuria.  Plan to do the CT of the abdomen pelvis as opposed to starting with ultrasound given this will show [place] the bowel as well.  Will give Toradol for  pain.  CT of the abdomen pelvis is concerning for possible enlarged and edematous left ovary.  Obtained a pelvic ultrasound to evaluate for evidence  of torsion while the ovary does have flow the flow is of high resistance, for torsion detorsion.  I attempted to call Dr. Valentino Saxon but got a voicemail.  Lindwood Coke nurse midwife at [facility] discussed the case with her.  She says she will come see the patient but will need to talk to Dr. Valentino Saxon as well.       FINAL CLINICAL IMPRESSION(S) / ED DIAGNOSES   Final diagnoses:  Pelvic pain     Rx / DC Orders   ED Discharge Orders          Ordered    HYDROcodone-acetaminophen (NORCO/VICODIN) 5-325 MG tablet  Every 4 hours PRN        06/28/22 2045             Note:  This document was prepared using Dragon voice recognition software and may include unintentional dictation errors.   [name], MD 06/30/22 [phone]

## 2022-06-28 NOTE — ED Provider Notes (Signed)
-----------------------------------------   8:42 PM on 06/28/2022 -----------------------------------------  Blood pressure 122/82, pulse (!) 109, temperature 98.5 F (36.9 C), temperature source Oral, resp. rate 18, SpO2 95 %.  Assuming care from Dr. Sidney Ace.  In short, Rose [name] is a 47 y.o. female with a chief complaint of Abdominal Pain .  Refer to the original H&P for additional details.  The current plan of care is to have certified nurse midwife assessed the patient, reached out to on-call OB/GYN for review of ultrasound for possible intermittent torsion.  Nurse midwife present, advises that we need to add on a GC and chlamydia to the urine that we do not need to hold her for results.  OB/GYN has reviewed the results and is determined that this is not a torsion that needs surgical intervention.  She will have close follow-up instructions regarding pain or other symptoms.  At this time recommend discharge with pain medication.   ED diagnosis:  Pelvic pain in female.    [name] 06/28/22 2047    [name], MD 06/30/22 [phone]

## 2022-06-28 NOTE — Consult Note (Signed)
Consult History and Physical   SERVICE: Gynecology   Patient Name: Rose [name] Patient MRN:   [id number]  CC: abdominal & pelvic pain  HPI: Rose [name] is a 47 y.o. G2P1011 with lower abdominal pain off & on for two days.   Review of Systems: positives in bold GEN:   fevers, chills, weight changes, appetite changes, fatigue, night sweats HEENT:  HA, vision changes, hearing loss, congestion, rhinorrhea, sinus pressure, dysphagia CV:   CP, palpitations PULM:  SOB, cough GI:  abd pain, N/V/D/C GU:  dysuria, urgency, frequency MSK:  arthralgias, myalgias, back pain, swelling SKIN:  rashes, color changes, pallor NEURO:  numbness, weakness, tingling, seizures, dizziness, tremors PSYCH:  depression, anxiety, behavioral problems, confusion  HEME/LYMPH:  easy bruising or bleeding ENDO:  heat/cold intolerance  Past Obstetrical History: OB History     Gravida  2   Para  1   Term  1   Preterm      AB  1   Living  1      SAB  1   IAB      Ectopic      Multiple      Live Births  1           Past Gynecologic History: No LMP recorded. (Menstrual status: IUD).   Past Medical History: Past Medical History:  Diagnosis Date   ADHD    Anxiety    Diabetes mellitus without complication (HCC)    Family history of adverse reaction to anesthesia    SISTER-TOO MUCH ANESTHESIA FOR C-SECTION   GERD (gastroesophageal reflux disease)    History of kidney stones    Neck pain    Right leg pain     Past Surgical History:   Past Surgical History:  Procedure Laterality Date   CESAREAN SECTION     CHOLECYSTECTOMY     CYSTOSCOPY W/ RETROGRADES  06/29/2019   Procedure: CYSTOSCOPY WITH RETROGRADE PYELOGRAM;  Surgeon: Sondra Come, MD;  Location: [facility];  Service: Urology;;   CYSTOSCOPY WITH RETROGRADE PYELOGRAM, URETEROSCOPY AND STENT PLACEMENT Left 01/17/2019   Procedure: CYSTOSCOPY WITH RETROGRADE PYELOGRAM, URETEROSCOPY AND STENT PLACEMENT;  Surgeon:  Riki Altes, MD;  Location: [facility];  Service: Urology;  Laterality: Left;   CYSTOSCOPY/URETEROSCOPY/HOLMIUM LASER/STENT PLACEMENT Right 06/29/2019   Procedure: CYSTOSCOPY/URETEROSCOPY/HOLMIUM LASER/STENT PLACEMENT;  Surgeon: Sondra Come, MD;  Location: [facility];  Service: Urology;  Laterality: Right;   EXTRACORPOREAL SHOCK WAVE LITHOTRIPSY Right 06/01/2019   Procedure: EXTRACORPOREAL SHOCK WAVE LITHOTRIPSY (ESWL);  Surgeon: Sondra Come, MD;  Location: [facility];  Service: Urology;  Laterality: Right;   kidney stone removal  01/21, 05/21, 06/21   LAPAROSCOPY N/A 08/24/2013   Procedure: LAPAROSCOPY OPERATIVE with lysis of adhesions ;  Surgeon: Zelphia Cairo, MD;  Location: [facility];  Service: Gynecology;  Laterality: N/A;   STONE EXTRACTION WITH BASKET Left 01/17/2019   Procedure: STONE EXTRACTION WITH BASKET;  Surgeon: Riki Altes, MD;  Location: [facility];  Service: Urology;  Laterality: Left;   wrist, cyst removal      Family History:  family history includes Aneurysm in her mother; Blindness in her father; High blood pressure in her father; Hypertension in her sister and sister.  Social History:  Social History   Socioeconomic History   Marital status: Single    Spouse name: Not on file   Number of children: 1   Years of education: GED   Highest education level: Not on file  Occupational  History    Employer: [employer]  Tobacco Use   Smoking status: Former    Types: E-cigarettes    Passive exposure: Past   Smokeless tobacco: Never   Tobacco comments:    History of vaping.  Did not vape daily.  Did socially.  Started 2020, Stopped in 2022.    Vaping Use   Vaping Use: Some days   Substances: CBD  Substance and Sexual Activity   Alcohol use: Not Currently    Alcohol/week: 1.0 standard drink of alcohol    Types: 1 Standard drinks or equivalent per week    Comment: WINE OCC   Drug use: Not Currently    Types: Marijuana    Comment: last use 05/29/19    Sexual activity: Not Currently    Birth control/protection: I.U.D.  Other Topics Concern   Not on file  Social History Narrative   Patient is single and she takes care of her daughter. Patient works full time Engineer, civil (consulting).   Education GED.   Right handed.   Caffeine one cup of coffee daily and soda daily.   Children One       Social Determinants of Health   Financial Resource Strain: Not on file  Food Insecurity: Not on file  Transportation Needs: Not on file  Physical Activity: Not on file  Stress: Not on file  Social Connections: Not on file  Intimate Partner Violence: Not on file    Home Medications:  Medications reconciled in EPIC  No current facility-administered medications on file prior to encounter.   Current Outpatient Medications on File Prior to Encounter  Medication Sig Dispense Refill   albuterol (VENTOLIN HFA) 108 (90 Base) MCG/ACT inhaler Inhale 2 puffs into the lungs every 6 (six) hours as needed for wheezing or shortness of breath. 8 g 0   Ascorbic Acid (VITAMIN C) 1000 MG tablet Take 2,000 mg by mouth daily.     Carboxymethylcellul-Glycerin (LUBRICATING EYE DROPS OP) Place 1 drop into both eyes daily as needed (dry eyes).     cholecalciferol (VITAMIN D3) 25 MCG (1000 UNIT) tablet Take 3,000 Units by mouth daily.     EPINEPHrine 0.3 mg/0.3 mL IJ SOAJ injection Inject into the muscle.     glucose blood (ONE TOUCH ULTRA TEST) test strip 1 each by Other route daily. And lancets 1/day 250.00 100 each 12   insulin detemir (LEVEMIR) 100 UNIT/ML injection Inject 0.25 mLs (25 Units total) into the skin 2 (two) times daily. 10 mL 3   Insulin Syringe-Needle U-100 (SAFETY INSULIN SYRINGES) 30G X 5/16" 0.5 ML MISC 1 Syringe by Does not apply route daily. 30 each 3   ipratropium-albuterol (DUONEB) 0.5-2.5 (3) MG/3ML SOLN Take 3 mLs by nebulization 3 (three) times daily as needed (shortness of breath, chest tightness, wheeze). 180 mL 1   levonorgestrel (MIRENA) 20  MCG/24HR IUD 1 each by Intrauterine route once.     metFORMIN (GLUCOPHAGE) 1000 MG tablet Take 1 tablet (1,000 mg total) by mouth 2 (two) times daily. 180 tablet 0   metroNIDAZOLE (FLAGYL) 500 MG tablet Take 1 tablet (500 mg total) by mouth 2 (two) times daily. 14 tablet 0   Multiple Vitamin (ONE-A-DAY ESSENTIAL PO) Take 1 tablet by mouth daily.     omeprazole (PRILOSEC) 20 MG capsule Take 1 capsule (20 mg total) by mouth daily as needed (reflux). 90 capsule 3   predniSONE (DELTASONE) 20 MG tablet Take 2 tablets (40 mg total) by mouth daily with breakfast. 10 tablet  0   rosuvastatin (CRESTOR) 5 MG tablet Take 1 tablet (5 mg total) by mouth daily. 90 tablet 3   traZODone (DESYREL) 50 MG tablet Take 0.5-1 tablets (25-50 mg total) by mouth at bedtime as needed for sleep. 30 tablet 2   zinc gluconate 50 MG tablet Take 50 mg by mouth daily.      Allergies:  Allergies  Allergen Reactions   Bactrim [Sulfamethoxazole-Trimethoprim] Swelling   Neurontin [Gabapentin] Itching   Sulfa Antibiotics Other (See Comments)    Physical Exam:  Temp:  [98.5 F (36.9 C)] 98.5 F (36.9 C) (06/23 1404) Pulse Rate:  [109] 109 (06/23 1404) Resp:  [18] 18 (06/23 1404) BP: (122)/(82) 122/82 (06/23 1404) SpO2:  [95 %] 95 % (06/23 1404)   General Appearance:  Well developed, well nourished, no acute distress, alert and oriented, cooperative and appears stated age Cardiovascular:  Regular rate Pulmonary:  normal work of breathing Psychiatric:  Normal mood and affect, appropriate  Labs/Studies:   CBC and Coags:  Lab Results  Component Value Date   WBC 7.5 06/28/2022   NEUTOPHILPCT 47 08/15/2021   EOSPCT 2 08/15/2021   BASOPCT 1 08/15/2021   LYMPHOPCT 43 08/15/2021   HGB 11.9 (L) 06/28/2022   HCT 36.5 06/28/2022   MCV 79.2 (L) 06/28/2022   PLT 333 06/28/2022   CMP:  Lab Results  Component Value Date   NA 139 06/28/2022   K 3.4 (L) 06/28/2022   CL 106 06/28/2022   CO2 26 06/28/2022   BUN 11  06/28/2022   CREATININE 0.78 06/28/2022   CREATININE 0.79 06/27/2022   CREATININE 0.70 08/07/2021   PROT 7.2 06/28/2022   BILITOT 0.4 06/28/2022   ALT 12 06/28/2022   AST 17 06/28/2022   ALKPHOS 38 06/28/2022     Other Imaging: US PELVIC COMPLETE W TRANSVAGINAL AND TORSION R/O  Result Date: 06/28/2022 CLINICAL DATA:  Ovarian torsion EXAM: TRANSABDOMINAL AND TRANSVAGINAL ULTRASOUND OF PELVIS DOPPLER ULTRASOUND OF OVARIES TECHNIQUE: Both transabdominal and transvaginal ultrasound examinations of the pelvis were performed. Transabdominal technique was performed for global imaging of the pelvis including uterus, ovaries, adnexal regions, and pelvic cul-de-sac. It was necessary to proceed with endovaginal exam following the transabdominal exam to visualize the uterus, endometrium, ovaries. Color and duplex Doppler ultrasound was utilized to evaluate blood flow to the ovaries. COMPARISON:  CT abdomen and pelvis 06/28/2022 FINDINGS: Uterus Measurements: 11.9 x 5.6 x 7.2 cm = volume: 248 mL. Small 9 mm round hypoechoic area in the posterior right uterus likely a fibroid. Endometrium IUD in appropriate position. Right ovary Measurements: 2.6 x 1.6 x 1.5 cm = volume: 3.3 mL. Normal appearance/no adnexal mass. Left ovary Measurements: 2.1 x 1.3 x 1.6 cm = volume: 2.4 mL. The left ovary is difficult to visualize due to the superior/lateral position. Question edema versus mild fallopian tube dilation about the left ovary. Pulsed Doppler evaluation of both ovaries demonstrates normal low-resistance arterial and venous waveforms in the right ovary. The left ovary demonstrates arterial and venous waveforms however the arterial waveforms of a high resistance appearance. Other findings No abnormal free fluid. IMPRESSION: Arterial and venous waveforms are present in the left ovary however the arterial waveforms have a high resistive appearance. Question edema about the left ovary. These findings are nonspecific but  could be seen with torsion-detorsion. Electronically Signed   By: Minerva Fester M.D.   On: 06/28/2022 20:00   CT ABDOMEN PELVIS W CONTRAST  Result Date: 06/28/2022 CLINICAL DATA:  Left lower quadrant  and suprapubic abdominal pain, tender to palpation EXAM: CT ABDOMEN AND PELVIS WITH CONTRAST TECHNIQUE: Multidetector CT imaging of the abdomen and pelvis was performed using the standard protocol following bolus administration of intravenous contrast. RADIATION DOSE REDUCTION: This exam was performed according to the departmental dose-optimization program which includes automated exposure control, adjustment of the mA and/or kV according to patient size and/or use of iterative reconstruction technique. CONTRAST:  OMNIPAQUE IOHEXOL 300 MG/ML  SOLN COMPARISON:  09/10/2021, 08/07/2021 FINDINGS: Lower chest: No acute pleural or parenchymal lung disease. Hepatobiliary: No focal liver abnormality is seen. Status post cholecystectomy. No biliary dilatation. Pancreas: Unremarkable. No pancreatic ductal dilatation or surrounding inflammatory changes. Spleen: Normal in size without focal abnormality. Adrenals/Urinary Tract: Chronic right renal cortical scarring. Stable benign left renal cyst does not require follow-up. No urinary tract calculi or obstructive uropathy. The adrenals are unremarkable. The bladder is decompressed, limiting its evaluation. Stomach/Bowel: No bowel obstruction or ileus. Normal appendix right lower quadrant. No bowel wall thickening or inflammatory change. Vascular/Lymphatic: Aortic atherosclerosis. No enlarged abdominal or pelvic lymph nodes. Reproductive: IUD within the endometrial cavity. The uterus is otherwise unremarkable. The right ovary measures approximately 2.2 x 1.6 cm and is unremarkable. The left ovary measures 3.6 x 2.5 cm, with mild surrounding fat stranding. If there is clinical concern for ovarian torsion, pelvic ultrasound including Doppler interrogation is recommended.  Other: No free fluid or free intraperitoneal gas. No abdominal wall hernia. Musculoskeletal: No acute or destructive bony abnormalities. Reconstructed images demonstrate no additional findings. IMPRESSION: 1. Slight asymmetric enlargement of the left ovary, with surrounding fat stranding. This could reflect ovarian torsion in the appropriate clinical setting, and further evaluation with pelvic Doppler ultrasound may be useful. 2. Otherwise no acute intra-abdominal or intrapelvic process. Normal appendix. 3.  Aortic Atherosclerosis (ICD10-I70.0). Electronically Signed   By: Sharlet Salina M.D.   On: 06/28/2022 16:47     Assessment / Plan:   Rose [name] is a 47 y.o. G2P1011 who presents with lower abdominal pain  1. Imaging reviewed remotely by Dr. Valentino Saxon, no evidence of torsion on imaging. Recommends discharge home with pain management & to return to ED with increased pain. Recommend GC/Ct testing as well. This was relayed to both patient & primary team.   Thank you for the opportunity to be involved with this patient's care.  ----- Dominica Severin, CNM [facility]

## 2022-06-29 ENCOUNTER — Telehealth: Payer: Self-pay

## 2022-06-29 NOTE — Telephone Encounter (Signed)
Pt called triage needing to schedule a ER follow up,  call sent to [facility] to schedule.

## 2022-07-03 ENCOUNTER — Ambulatory Visit: Payer: BC Managed Care – PPO | Admitting: Certified Nurse Midwife

## 2022-07-03 NOTE — Progress Notes (Unsigned)
    GYNECOLOGY PROGRESS NOTE  Subjective:    [name], female    DOB: [birth year], 47 y.o.   MRN: [id number]  HPI  Patient is a 47 y.o. G105P1011 female who presents for ED follow up. She was evaluated at ED on 06/28/2022 for pelvic pain. She presented to ED on that day with crampy/spasming type pain in the lower abdomen in her suprapubic region and left lower quadrant left mid abdomen.  Pain comes and goes at the time. CT scan and pelvic ultrasound was done. CT of the abdomen pelvis is concerning for possible enlarged and edematous left ovary.  Obtained a pelvic ultrasound to evaluate for evidence of torsion while the ovary does have flow the flow is of high resistance, for torsion detorsion.    {Common ambulatory SmartLinks:19316}  Review of Systems {ros; complete:30496}   Objective:   There were no vitals taken for this visit. There is no height or weight on file to calculate BMI. General appearance: {general exam:16600} Abdomen: {abdominal exam:16834} Pelvic: {pelvic exam:16852::"cervix normal in appearance","external genitalia normal","no adnexal masses or tenderness","no cervical motion tenderness","rectovaginal septum normal","uterus normal size, shape, and consistency","vagina normal without discharge"} Extremities: {extremity exam:5109} Neurologic: {neuro exam:17854}   Assessment:   1. Pelvic pain      Plan:   There are no diagnoses linked to this encounter.    Hildred Laser, MD [facility]

## 2022-07-07 ENCOUNTER — Encounter: Payer: Self-pay | Admitting: Obstetrics and Gynecology

## 2022-07-07 ENCOUNTER — Ambulatory Visit: Payer: PRIVATE HEALTH INSURANCE | Admitting: Obstetrics and Gynecology

## 2022-07-07 VITALS — BP 144/91 | Resp 16 | Ht 64.0 in | Wt 180.6 lb

## 2022-07-07 DIAGNOSIS — R102 Pelvic and perineal pain: Secondary | ICD-10-CM | POA: Diagnosis not present

## 2022-07-07 DIAGNOSIS — Z0289 Encounter for other administrative examinations: Secondary | ICD-10-CM

## 2022-07-07 DIAGNOSIS — K5900 Constipation, unspecified: Secondary | ICD-10-CM | POA: Diagnosis not present

## 2022-07-07 NOTE — Patient Instructions (Signed)
GYNECOLOGY PRE-OPERATIVE INSTRUCTIONS  You are scheduled for surgery on 07/13/2022.  The name of your procedure is: Diagnostic Laparoscopy.   Please read through these instructions carefully regarding preparation for your surgery: Nothing to eat after midnight on the day prior to surgery.  Do not take any medications unless recommended by your provider on day prior to surgery.  Do not take NSAIDs (Motrin, Aleve) or aspirin 7 days prior to surgery.  You may take Tylenol products for minor aches and pains.  You will receive a prescription for pain medications post-operatively.  You will be contacted by phone prior to surgery for your hospital pre-admit appointment.  You will need someone to bring you to the hospital and to drive you home.  You are not allowed to drive 24 hours after surgery. Please call the office if you have any questions regarding your upcoming surgery.    Thank you for choosing [facility].

## 2022-07-10 ENCOUNTER — Encounter: Payer: Self-pay | Admitting: Obstetrics and Gynecology

## 2022-07-12 MED ORDER — SODIUM CHLORIDE 0.9 % IV SOLN: INTRAVENOUS | Status: DC

## 2022-07-12 MED ORDER — ORAL CARE MOUTH RINSE: 15.0000 mL | Freq: Once | OROMUCOSAL | Status: AC

## 2022-07-12 MED ORDER — CHLORHEXIDINE GLUCONATE 0.12 % MT SOLN
15.0000 mL | Freq: Once | OROMUCOSAL | Status: AC
  Administered 2022-07-13: 15 mL via OROMUCOSAL

## 2022-07-13 ENCOUNTER — Other Ambulatory Visit: Payer: Self-pay

## 2022-07-13 ENCOUNTER — Ambulatory Visit: Payer: PRIVATE HEALTH INSURANCE | Admitting: Anesthesiology

## 2022-07-13 ENCOUNTER — Encounter
Admission: AD | Disposition: A | Discharge status: Home / Self Care | Payer: Self-pay | Source: Home / Self Care | Attending: Obstetrics and Gynecology

## 2022-07-13 ENCOUNTER — Ambulatory Visit
Admission: AD | Admit: 2022-07-13 | Discharge: 2022-07-13 | Disposition: A | Discharge status: Home / Self Care | Payer: PRIVATE HEALTH INSURANCE | Attending: Obstetrics and Gynecology | Admitting: Obstetrics and Gynecology

## 2022-07-13 ENCOUNTER — Encounter: Payer: Self-pay | Admitting: Obstetrics and Gynecology

## 2022-07-13 DIAGNOSIS — Z7984 Long term (current) use of oral hypoglycemic drugs: Secondary | ICD-10-CM | POA: Insufficient documentation

## 2022-07-13 DIAGNOSIS — K219 Gastro-esophageal reflux disease without esophagitis: Secondary | ICD-10-CM | POA: Insufficient documentation

## 2022-07-13 DIAGNOSIS — E119 Type 2 diabetes mellitus without complications: Secondary | ICD-10-CM | POA: Diagnosis not present

## 2022-07-13 DIAGNOSIS — N809 Endometriosis, unspecified: Secondary | ICD-10-CM | POA: Insufficient documentation

## 2022-07-13 DIAGNOSIS — Z87891 Personal history of nicotine dependence: Secondary | ICD-10-CM | POA: Insufficient documentation

## 2022-07-13 DIAGNOSIS — N83201 Unspecified ovarian cyst, right side: Secondary | ICD-10-CM | POA: Diagnosis not present

## 2022-07-13 DIAGNOSIS — K661 Hemoperitoneum: Secondary | ICD-10-CM

## 2022-07-13 DIAGNOSIS — Z794 Long term (current) use of insulin: Secondary | ICD-10-CM | POA: Insufficient documentation

## 2022-07-13 DIAGNOSIS — Z975 Presence of (intrauterine) contraceptive device: Secondary | ICD-10-CM | POA: Insufficient documentation

## 2022-07-13 DIAGNOSIS — Z09 Encounter for follow-up examination after completed treatment for conditions other than malignant neoplasm: Secondary | ICD-10-CM | POA: Diagnosis not present

## 2022-07-13 DIAGNOSIS — N8311 Corpus luteum cyst of right ovary: Secondary | ICD-10-CM | POA: Insufficient documentation

## 2022-07-13 DIAGNOSIS — R102 Pelvic and perineal pain: Secondary | ICD-10-CM

## 2022-07-13 DIAGNOSIS — N736 Female pelvic peritoneal adhesions (postinfective): Secondary | ICD-10-CM | POA: Diagnosis not present

## 2022-07-13 HISTORY — PX: LAPAROSCOPY: SHX197

## 2022-07-13 LAB — GLUCOSE, CAPILLARY
Glucose-Capillary: 95 mg/dL (ref 70–99)
Glucose-Capillary: 75 mg/dL (ref 70–99)
Glucose-Capillary: 189 mg/dL — ABNORMAL HIGH (ref 70–99)

## 2022-07-13 LAB — CBC
HCT: 36.9 % (ref 36.0–46.0)
Hemoglobin: 12.1 g/dL (ref 12.0–15.0)
MCH: 25.5 pg — ABNORMAL LOW (ref 26.0–34.0)
MCHC: 32.8 g/dL (ref 30.0–36.0)
MCV: 77.7 fL — ABNORMAL LOW (ref 80.0–100.0)
Platelets: 270 10*3/uL (ref 150–400)
RBC: 4.75 MIL/uL (ref 3.87–5.11)
RDW: 14.6 % (ref 11.5–15.5)
WBC: 7.5 10*3/uL (ref 4.0–10.5)
nRBC: 0 % (ref 0.0–0.2)

## 2022-07-13 LAB — POCT PREGNANCY, URINE: Preg Test, Ur: NEGATIVE

## 2022-07-13 SURGERY — LAPAROSCOPY, DIAGNOSTIC
Anesthesia: General | Site: Pelvis | Laterality: Left

## 2022-07-13 MED ORDER — GABAPENTIN 300 MG PO CAPS
300.0000 mg | ORAL_CAPSULE | ORAL | Status: AC
  Administered 2022-07-13: 300 mg via ORAL

## 2022-07-13 MED ORDER — OXYCODONE HCL 5 MG/5ML PO SOLN: 5.0000 mg | Freq: Once | ORAL | Status: AC | PRN

## 2022-07-13 MED ORDER — CELECOXIB 200 MG PO CAPS
ORAL_CAPSULE | ORAL | Status: AC
  Filled 2022-07-13: qty 1

## 2022-07-13 MED ORDER — 0.9 % SODIUM CHLORIDE (POUR BTL) OPTIME
TOPICAL | Status: DC | PRN
  Administered 2022-07-13: 500 mL

## 2022-07-13 MED ORDER — PHENYLEPHRINE HCL (PRESSORS) 10 MG/ML IV SOLN
INTRAVENOUS | Status: DC | PRN
  Administered 2022-07-13 (×2): 80 ug via INTRAVENOUS

## 2022-07-13 MED ORDER — DIPHENHYDRAMINE HCL 50 MG/ML IJ SOLN
INTRAMUSCULAR | Status: AC
  Filled 2022-07-13: qty 1

## 2022-07-13 MED ORDER — CHLORHEXIDINE GLUCONATE 0.12 % MT SOLN
OROMUCOSAL | Status: AC
  Filled 2022-07-13: qty 15

## 2022-07-13 MED ORDER — DIPHENHYDRAMINE HCL 50 MG/ML IJ SOLN
INTRAMUSCULAR | Status: DC | PRN
  Administered 2022-07-13: 12.5 mg via INTRAVENOUS

## 2022-07-13 MED ORDER — ROCURONIUM BROMIDE 100 MG/10ML IV SOLN
INTRAVENOUS | Status: DC | PRN
  Administered 2022-07-13: 50 mg via INTRAVENOUS

## 2022-07-13 MED ORDER — DEXAMETHASONE SODIUM PHOSPHATE 10 MG/ML IJ SOLN
INTRAMUSCULAR | Status: AC
  Filled 2022-07-13: qty 1

## 2022-07-13 MED ORDER — ONDANSETRON HCL 4 MG/2ML IJ SOLN
INTRAMUSCULAR | Status: DC | PRN
  Administered 2022-07-13: 4 mg via INTRAVENOUS

## 2022-07-13 MED ORDER — LIDOCAINE HCL (CARDIAC) PF 100 MG/5ML IV SOSY
PREFILLED_SYRINGE | INTRAVENOUS | Status: DC | PRN
  Administered 2022-07-13: 80 mg via INTRAVENOUS

## 2022-07-13 MED ORDER — FENTANYL CITRATE (PF) 100 MCG/2ML IJ SOLN
INTRAMUSCULAR | Status: DC | PRN
  Administered 2022-07-13 (×2): 50 ug via INTRAVENOUS

## 2022-07-13 MED ORDER — ROCURONIUM BROMIDE 10 MG/ML (PF) SYRINGE
PREFILLED_SYRINGE | INTRAVENOUS | Status: AC
  Filled 2022-07-13: qty 10

## 2022-07-13 MED ORDER — DOCUSATE SODIUM 100 MG PO CAPS: 100.0000 mg | ORAL_CAPSULE | Freq: Two times a day (BID) | ORAL | 1 refills | Status: AC | PRN

## 2022-07-13 MED ORDER — CELECOXIB 200 MG PO CAPS
400.0000 mg | ORAL_CAPSULE | ORAL | Status: AC
  Administered 2022-07-13: 400 mg via ORAL

## 2022-07-13 MED ORDER — IBUPROFEN 600 MG PO TABS: 600.0000 mg | ORAL_TABLET | Freq: Four times a day (QID) | ORAL | 2 refills | Status: AC | PRN

## 2022-07-13 MED ORDER — GABAPENTIN 300 MG PO CAPS
ORAL_CAPSULE | ORAL | Status: AC
  Filled 2022-07-13: qty 1

## 2022-07-13 MED ORDER — OXYCODONE HCL 5 MG PO TABS
5.0000 mg | ORAL_TABLET | Freq: Once | ORAL | Status: AC | PRN
  Administered 2022-07-13: 5 mg via ORAL

## 2022-07-13 MED ORDER — BUPIVACAINE HCL (PF) 0.5 % IJ SOLN
INTRAMUSCULAR | Status: AC
  Filled 2022-07-13: qty 30

## 2022-07-13 MED ORDER — PROPOFOL 10 MG/ML IV BOLUS
INTRAVENOUS | Status: AC
  Filled 2022-07-13: qty 20

## 2022-07-13 MED ORDER — FENTANYL CITRATE (PF) 100 MCG/2ML IJ SOLN
INTRAMUSCULAR | Status: AC
  Filled 2022-07-13: qty 2

## 2022-07-13 MED ORDER — MIDAZOLAM HCL 2 MG/2ML IJ SOLN
INTRAMUSCULAR | Status: AC
  Filled 2022-07-13: qty 2

## 2022-07-13 MED ORDER — SUGAMMADEX SODIUM 200 MG/2ML IV SOLN
INTRAVENOUS | Status: DC | PRN
  Administered 2022-07-13: 200 mg via INTRAVENOUS

## 2022-07-13 MED ORDER — HYDROCODONE-ACETAMINOPHEN 5-325 MG PO TABS: 1.0000 | ORAL_TABLET | Freq: Four times a day (QID) | ORAL | 0 refills | Status: AC | PRN

## 2022-07-13 MED ORDER — ONDANSETRON HCL 4 MG/2ML IJ SOLN
INTRAMUSCULAR | Status: AC
  Filled 2022-07-13: qty 2

## 2022-07-13 MED ORDER — BUPIVACAINE HCL 0.5 % IJ SOLN
INTRAMUSCULAR | Status: DC | PRN
  Administered 2022-07-13: 9 mL

## 2022-07-13 MED ORDER — HYDROMORPHONE HCL 1 MG/ML IJ SOLN
INTRAMUSCULAR | Status: AC
  Filled 2022-07-13: qty 1

## 2022-07-13 MED ORDER — OXYCODONE HCL 5 MG PO TABS
ORAL_TABLET | ORAL | Status: AC
  Filled 2022-07-13: qty 1

## 2022-07-13 MED ORDER — DEXMEDETOMIDINE HCL IN NACL 80 MCG/20ML IV SOLN
INTRAVENOUS | Status: DC | PRN
  Administered 2022-07-13: 4 ug via INTRAVENOUS

## 2022-07-13 MED ORDER — ACETAMINOPHEN 500 MG PO TABS
ORAL_TABLET | ORAL | Status: AC
  Filled 2022-07-13: qty 2

## 2022-07-13 MED ORDER — MIDAZOLAM HCL 2 MG/2ML IJ SOLN
INTRAMUSCULAR | Status: DC | PRN
  Administered 2022-07-13: 2 mg via INTRAVENOUS

## 2022-07-13 MED ORDER — PROPOFOL 10 MG/ML IV BOLUS
INTRAVENOUS | Status: DC | PRN
  Administered 2022-07-13: 200 mg via INTRAVENOUS

## 2022-07-13 MED ORDER — LIDOCAINE HCL (PF) 2 % IJ SOLN
INTRAMUSCULAR | Status: AC
  Filled 2022-07-13: qty 5

## 2022-07-13 MED ORDER — HYDROMORPHONE HCL 1 MG/ML IJ SOLN
0.2500 mg | INTRAMUSCULAR | Status: DC | PRN
  Administered 2022-07-13 (×4): 0.5 mg via INTRAVENOUS

## 2022-07-13 MED ORDER — ACETAMINOPHEN 500 MG PO TABS
1000.0000 mg | ORAL_TABLET | ORAL | Status: AC
  Administered 2022-07-13: 1000 mg via ORAL

## 2022-07-13 MED ORDER — LACTATED RINGERS IV SOLN: INTRAVENOUS | Status: DC

## 2022-07-13 MED ORDER — SIMETHICONE 80 MG PO CHEW: 80.0000 mg | CHEWABLE_TABLET | Freq: Four times a day (QID) | ORAL | 1 refills | Status: AC | PRN

## 2022-07-13 SURGICAL SUPPLY — 47 items
ADH SKN CLS APL DERMABOND .7 (GAUZE/BANDAGES/DRESSINGS) ×1
APL PRP STRL LF DISP 70% ISPRP (MISCELLANEOUS) ×1
BLADE SURG SZ11 CARB STEEL (BLADE) ×1 IMPLANT
CATH ROBINSON RED A/P 16FR (CATHETERS) ×1 IMPLANT
CHLORAPREP W/TINT 26 (MISCELLANEOUS) ×1 IMPLANT
CORD MONOPOLAR M/FML 12FT (MISCELLANEOUS) IMPLANT
DERMABOND ADVANCED .7 DNX12 (GAUZE/BANDAGES/DRESSINGS) ×1 IMPLANT
DRSG TELFA 3X8 NADH STRL (GAUZE/BANDAGES/DRESSINGS) IMPLANT
GAUZE 4X4 16PLY ~~LOC~~+RFID DBL (SPONGE) ×2 IMPLANT
GLOVE BIO SURGEON STRL SZ 6.5 (GLOVE) ×2 IMPLANT
GLOVE INDICATOR 7.0 STRL GRN (GLOVE) ×1 IMPLANT
GLOVE PI ORTHO PRO STRL 7.5 (GLOVE) ×1 IMPLANT
GOWN STRL REUS W/ TWL LRG LVL3 (GOWN DISPOSABLE) ×3 IMPLANT
GOWN STRL REUS W/TWL LRG LVL3 (GOWN DISPOSABLE) ×3
GOWN STRL REUS W/TWL XL LVL4 (GOWN DISPOSABLE) ×1 IMPLANT
GRASPER SUT TROCAR 14GX15 (MISCELLANEOUS) IMPLANT
IRRIGATION STRYKERFLOW (MISCELLANEOUS) ×1 IMPLANT
IRRIGATOR STRYKERFLOW (MISCELLANEOUS) ×1
IV LACTATED RINGERS 1000ML (IV SOLUTION) ×1 IMPLANT
IV NS 1000ML (IV SOLUTION) ×1
IV NS 1000ML BAXH (IV SOLUTION) IMPLANT
KIT PINK PAD W/HEAD ARE REST (MISCELLANEOUS) ×1 IMPLANT
KIT PINK PAD W/HEAD ARM REST (MISCELLANEOUS) ×1 IMPLANT
KIT TURNOVER CYSTO (KITS) ×1 IMPLANT
MANIFOLD NEPTUNE II (INSTRUMENTS) ×1 IMPLANT
NS IRRIG 500ML POUR BTL (IV SOLUTION) ×1 IMPLANT
PACK GYN LAPAROSCOPIC (MISCELLANEOUS) ×1 IMPLANT
PAD OB MATERNITY 4.3X12.25 (PERSONAL CARE ITEMS) ×1 IMPLANT
PAD PREP OB/GYN DISP 24X41 (PERSONAL CARE ITEMS) ×1 IMPLANT
SCISSORS METZENBAUM CVD 33 (INSTRUMENTS) IMPLANT
SCRUB CHG 4% DYNA-HEX 4OZ (MISCELLANEOUS) ×1 IMPLANT
SET TUBE SMOKE EVAC HIGH FLOW (TUBING) ×1 IMPLANT
SHEARS HARMONIC ACE PLUS 36CM (ENDOMECHANICALS) IMPLANT
SLEEVE SCD COMPRESS KNEE MED (STOCKING) IMPLANT
SLEEVE Z-THREAD 5X100MM (TROCAR) ×1 IMPLANT
SUT VIC AB 3-0 SH 27 (SUTURE)
SUT VIC AB 3-0 SH 27X BRD (SUTURE) IMPLANT
SUT VIC AB 4-0 FS2 27 (SUTURE) ×1 IMPLANT
SUT VICRYL 0 UR6 27IN ABS (SUTURE) ×1 IMPLANT
SYR 50ML LL SCALE MARK (SYRINGE) IMPLANT
SYS BAG RETRIEVAL 10MM (BASKET)
SYSTEM BAG RETRIEVAL 10MM (BASKET) IMPLANT
TRAP FLUID SMOKE EVACUATOR (MISCELLANEOUS) ×1 IMPLANT
TROCAR XCEL UNIV SLVE 11M 100M (ENDOMECHANICALS) IMPLANT
TROCAR Z-THRD FIOS HNDL 11X100 (TROCAR) ×1 IMPLANT
TROCAR Z-THREAD FIOS 5X100MM (TROCAR) ×1 IMPLANT
WATER STERILE IRR 500ML POUR (IV SOLUTION) ×1 IMPLANT

## 2022-07-13 NOTE — Discharge Instructions (Signed)

## 2022-07-13 NOTE — Op Note (Signed)
Procedure(s): LAPAROSCOPY DIAGNOSTIC, EXCISION OF ENDOMETRIOSIS Procedure Note  Rose [name] female 47 y.o. 07/13/2022  Indications: The patient is a 46 y.o. 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    Post-operative Diagnosis: Pelvic pain, signifincant pelvic adhesive disease, suspected endometriosis, IUD in place.   Surgeon: Hildred Laser, MD  Assistants:  Surgical scrub assist.   Anesthesia: General endotracheal anesthesia  Findings: The uterus was sounded to 11 cm. IUD threads not readily visible at external cervical os.  Small hemoperitoneum. Significant pelvic adhesive disease. Left adnexae densely adherent to left pelvic sidewall. No obvious evidence of ovarian torsion. Left fallopian tube mildly dilated.   Two small powder burn lesions on left adnexae at level of broad ligament and round ligament. Uterus with significant adhesions to anterior abdominal wall.  Partial obliteration of anterior cul-de-sac, bladder unable to be fully visualized. Right fallopian tube appeared normal, right ovary with small corpus luteal cyst.  Upper abdomen appeared normal.    Procedure Details: The patient was seen in the Holding Room. The risks, benefits, complications, treatment options, and expected outcomes were discussed with the patient.  The patient concurred with the proposed plan, giving informed consent.  The site of surgery properly noted/marked. The patient was taken to the Operating Room, identified as Rose Dean Struble and the procedure verified as Procedure(s) (LRB): LAPAROSCOPY DIAGNOSTIC.   She was then placed under general anesthesia without difficulty. She was placed in the dorsal lithotomy position, and was prepped and draped in a sterile manner.  A Time Out was held and the above  information confirmed.  A straight catheterization was performed. A sterile speculum was inserted into the vagina and the cervix was grasped at the anterior lip using a single-toothed tenaculum.  The IUD threads were not readily visible at the external cervical os. Tonsil forceps were introduced into the cervical canal and the threads were grasped and brought out of the canal, approximately 2 cm in length.  The uterus was then sounded to 11 cm, and a Hulka clamp was placed for uterine manipulation.  The speculum and tenaculum were then removed. After an adequate timeout was performed, attention was turned to the abdomen where an umbilical incision was made with the scalpel.  The Optiview 5-mm trocar and sleeve were then advanced without difficulty with the laparoscope under direct visualization into the abdomen.  The abdomen was then insufflated with carbon dioxide gas and adequate pneumoperitoneum was obtained. A 5-mm right lower quadrant port was then placed under direct visualization.  A survey of the patient's pelvis and abdomen revealed the findings as above.  Small hemoperitoneum was present in the lower pelvis. A suction irrigator was used to clear the fluid collection. Dense pelvic adhesions noted of the uterus to the anterior abdominal wall and the left adnexae (mainly tube and round ligament) to the left pelvic sidewall. Left ovary appeared normal, somewhat mobile. Left fallopian tube with mild dilation.  Right ovary with with small corpus luteal cyst, fallopian tube appeared normal. There were 2 powder burn lesions noted on the left round ligament and the left broad ligament. Biopsies were performed of these areas with minimal bleeding encountered.  There was good hemostasis noted throughout.  All trocars were removed under direct visualization, and the abdomen which was desufflated.    All skin incisions were closed with 4-0 Monocryl subcuticular stitches.  The patient tolerated the procedures well.   All instruments, needles, and sponge counts were correct x 2. The patient was taken to the recovery room awake, extubated and in stable condition.   Based on the level of adhesions and suspected endometriosis, at this time I would counsel patient on hysterectomy (most likely would need abdominal approach).  Or could consider further suppressive therapy with GnRH agonist in addition to current IUD that is in place. Further discussion to be had at patient's post-op visit.    Estimated Blood Loss:  minimal surgical loss.  Approximately 30 cc of hemoperitoneum present.      Drains: straight catheterization prior to procedure with 5 ml of clear urine         Total IV Fluids: 900 ml  Specimens: Biopsies of round ligament and broad ligament for suspected endometriosis         Implants: None         Complications:  None; patient tolerated the procedure well.         Disposition: PACU - hemodynamically stable.         Condition: stable   Hildred Laser, MD [facility]

## 2022-07-13 NOTE — Anesthesia Preprocedure Evaluation (Addendum)
Anesthesia Evaluation  Patient identified by MRN, date of birth, ID band Patient awake    Reviewed: Allergy & Precautions, NPO status , Patient's Chart, lab work & pertinent test results  History of Anesthesia Complications Negative for: history of anesthetic complications  Airway Mallampati: III  TM Distance: >3 FB Neck ROM: full    Dental  (+) Poor Dentition   Pulmonary asthma , former smoker   Pulmonary exam normal        Cardiovascular negative cardio ROS Normal cardiovascular exam     Neuro/Psych  PSYCHIATRIC DISORDERS Anxiety Depression    negative neurological ROS     GI/Hepatic Neg liver ROS,GERD  Controlled and Medicated,,  Endo/Other  diabetes, Type 2, Insulin Dependent, Oral Hypoglycemic Agents    Renal/GU Renal disease     Musculoskeletal   Abdominal   Peds  Hematology  (+) Blood dyscrasia, anemia   Anesthesia Other Findings Past Medical History: No date: ADHD No date: Anxiety No date: Diabetes mellitus without complication (HCC) No date: Family history of adverse reaction to anesthesia     Comment:  SISTER-TOO MUCH ANESTHESIA FOR C-SECTION No date: GERD (gastroesophageal reflux disease) No date: History of kidney stones No date: Neck pain No date: Right leg pain  Past Surgical History: No date: CESAREAN SECTION No date: CHOLECYSTECTOMY 06/29/2019: CYSTOSCOPY W/ RETROGRADES     Comment:  Procedure: CYSTOSCOPY WITH RETROGRADE PYELOGRAM;                Surgeon: Sondra Come, MD;  Location: [facility];                Service: Urology;; 01/17/2019: CYSTOSCOPY WITH RETROGRADE PYELOGRAM, URETEROSCOPY AND  STENT PLACEMENT; Left     Comment:  Procedure: CYSTOSCOPY WITH RETROGRADE PYELOGRAM,               URETEROSCOPY AND STENT PLACEMENT;  Surgeon: Riki Altes, MD;  Location: [facility];  Service: Urology;                Laterality: Left; 06/29/2019: CYSTOSCOPY/URETEROSCOPY/HOLMIUM  LASER/STENT PLACEMENT;  Right     Comment:  Procedure: CYSTOSCOPY/URETEROSCOPY/HOLMIUM LASER/STENT               PLACEMENT;  Surgeon: Sondra Come, MD;  Location:               [facility];  Service: Urology;  Laterality: Right; 06/01/2019: EXTRACORPOREAL SHOCK WAVE LITHOTRIPSY; Right     Comment:  Procedure: EXTRACORPOREAL SHOCK WAVE LITHOTRIPSY (ESWL);              Surgeon: Sondra Come, MD;  Location: [facility];                Service: Urology;  Laterality: Right; 01/21, 05/21, 06/21: kidney stone removal 08/24/2013: LAPAROSCOPY; N/A     Comment:  Procedure: LAPAROSCOPY OPERATIVE with lysis of adhesions              ;  Surgeon: Zelphia Cairo, MD;  Location: [facility];                Service: Gynecology;  Laterality: N/A; 01/17/2019: STONE EXTRACTION WITH BASKET; Left     Comment:  Procedure: STONE EXTRACTION WITH BASKET;  Surgeon:               Riki Altes, MD;  Location: [facility];  Service:  Urology;  Laterality: Left; No date: wrist, cyst removal     Reproductive/Obstetrics negative OB ROS                             Anesthesia Physical Anesthesia Plan  ASA: 3  Anesthesia Plan: General ETT   Post-op Pain Management: Toradol IV (intra-op)*, Ofirmev IV (intra-op)* and Dilaudid IV   Induction: Intravenous  PONV Risk Score and Plan: 3 and Ondansetron, Dexamethasone, Midazolam and Treatment may vary due to age or medical condition  Airway Management Planned: Oral ETT  Additional Equipment:   Intra-op Plan:   Post-operative Plan: Extubation in OR  Informed Consent: I have reviewed the patients History and Physical, chart, labs and discussed the procedure including the risks, benefits and alternatives for the proposed anesthesia with the patient or authorized representative who has indicated his/her understanding and acceptance.     Dental Advisory Given  Plan Discussed with: Anesthesiologist, CRNA and Surgeon  Anesthesia  Plan Comments: (Patient consented for risks of anesthesia including but not limited to:  - adverse reactions to medications - damage to eyes, teeth, lips or other oral mucosa - nerve damage due to positioning  - sore throat or hoarseness - Damage to heart, brain, nerves, lungs, other parts of body or loss of life  Patient voiced understanding.)        Anesthesia Quick Evaluation

## 2022-07-13 NOTE — H&P (Signed)
GYNECOLOGY PREOPERATIVE HISTORY AND PHYSICAL   Subjective:  Rose [name] is a 47 y.o. 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  She was evaluated at ED on 06/28/2022 for pelvic pain. She also had presented to the ED 1 day prior with crampy/spasming type pain in the lower abdomen in her suprapubic region and left lower quadrant  region but did not stay to be evaluated.  On the following day she reported back to the ER as her pain was described as "coming and going".  CT scan and pelvic ultrasound was done. CT of the abdomen pelvis is concerning for possible enlarged and edematous left ovary.  Ultrasound noted that while the ovary does have flow the flow is of high resistance, for torsion detorsion".  As she had no evidence of obvious signs of torsion, she was deemed to be okay for outpatient management with pain medication.  Notes that she was prescribed Norco however notes that this really has not done much for her pain.  Pain currently feels like a tugging or ripping sensation in the lower portion of her pelvis.  I   Proposed surgery: Diagnostic laparoscopy, possible detorsion of left ovary    Pertinent Gynecological History: Menses:  typically without a cycle with IUD, however did recently have a period last month.  Contraception: Mirena IUD Last mammogram: normal Date: 02/12/2021 Last pap: normal Date: 06/23/2018.   Past Medical History:  Diagnosis Date   ADHD    Anxiety    Diabetes mellitus without complication (HCC)    Family history of adverse reaction to anesthesia    SISTER-TOO MUCH ANESTHESIA FOR C-SECTION   GERD (gastroesophageal reflux disease)    History of kidney stones    Neck pain    Right leg pain     Past Surgical History:  Procedure Laterality Date   CESAREAN SECTION     CHOLECYSTECTOMY     CYSTOSCOPY W/ RETROGRADES  06/29/2019   Procedure: CYSTOSCOPY WITH RETROGRADE PYELOGRAM;  Surgeon:  Sondra Come, MD;  Location: [facility];  Service: Urology;;   CYSTOSCOPY WITH RETROGRADE PYELOGRAM, URETEROSCOPY AND STENT PLACEMENT Left 01/17/2019   Procedure: CYSTOSCOPY WITH RETROGRADE PYELOGRAM, URETEROSCOPY AND STENT PLACEMENT;  Surgeon: Riki Altes, MD;  Location: [facility];  Service: Urology;  Laterality: Left;   CYSTOSCOPY/URETEROSCOPY/HOLMIUM LASER/STENT PLACEMENT Right 06/29/2019   Procedure: CYSTOSCOPY/URETEROSCOPY/HOLMIUM LASER/STENT PLACEMENT;  Surgeon: Sondra Come, MD;  Location: [facility];  Service: Urology;  Laterality: Right;   EXTRACORPOREAL SHOCK WAVE LITHOTRIPSY Right 06/01/2019   Procedure: EXTRACORPOREAL SHOCK WAVE LITHOTRIPSY (ESWL);  Surgeon: Sondra Come, MD;  Location: [facility];  Service: Urology;  Laterality: Right;   kidney stone removal  01/21, 05/21, 06/21   LAPAROSCOPY N/A 08/24/2013   Procedure: LAPAROSCOPY OPERATIVE with lysis of adhesions ;  Surgeon: Zelphia Cairo, MD;  Location: [facility];  Service: Gynecology;  Laterality: N/A;   STONE EXTRACTION WITH BASKET Left 01/17/2019   Procedure: STONE EXTRACTION WITH BASKET;  Surgeon: Riki Altes, MD;  Location: [facility];  Service: Urology;  Laterality: Left;   wrist, cyst removal      OB History  Gravida Para Term Preterm AB Living  2 1 1   1 1   SAB IAB Ectopic Multiple Live Births  1       1    # Outcome Date GA Lbr Len/2nd Weight Sex Delivery Anes PTL Lv  2 SAB 2008  1 Term 89    F CS-Unspec  N LIV    Family History  Problem Relation Age of Onset   Aneurysm Mother    Blindness Father    High blood pressure Father    Hypertension Sister    Hypertension Sister    Diabetes Neg Hx     Social History   Socioeconomic History   Marital status: Single    Spouse name: Not on file   Number of children: 1   Years of education: GED   Highest education level: Not on file  Occupational History    Employer: [employer]  Tobacco Use   Smoking status: Former    Types:  E-cigarettes    Passive exposure: Past   Smokeless tobacco: Never   Tobacco comments:    History of vaping.  Did not vape daily.  Did socially.  Started 2020, Stopped in 2022.    Vaping Use   Vaping Use: Some days   Substances: CBD  Substance and Sexual Activity   Alcohol use: Not Currently    Alcohol/week: 1.0 standard drink of alcohol    Types: 1 Standard drinks or equivalent per week    Comment: WINE OCC   Drug use: Not Currently    Types: Marijuana    Comment: last use 05/29/19   Sexual activity: Not Currently    Birth control/protection: I.U.D.  Other Topics Concern   Not on file  Social History Narrative   Patient is single and she takes care of her daughter. Patient works full time Engineer, civil (consulting).   Education GED.   Right handed.   Caffeine one cup of coffee daily and soda daily.   Children One       Social Determinants of Health   Financial Resource Strain: Not on file  Food Insecurity: Not on file  Transportation Needs: Not on file  Physical Activity: Not on file  Stress: Not on file  Social Connections: Not on file  Intimate Partner Violence: Not on file   No current facility-administered medications on file prior to encounter.   Current Outpatient Medications on File Prior to Encounter  Medication Sig Dispense Refill   albuterol (VENTOLIN HFA) 108 (90 Base) MCG/ACT inhaler Inhale 2 puffs into the lungs every 6 (six) hours as needed for wheezing or shortness of breath. 8 g 0   Ascorbic Acid (VITAMIN C) 1000 MG tablet Take 2,000 mg by mouth daily.     Berberine Chloride (BERBERINE HCI) 500 MG CAPS Take 1 tablet by mouth 2 (two) times daily.     Carboxymethylcellul-Glycerin (LUBRICATING EYE DROPS OP) Place 1 drop into both eyes daily as needed (dry eyes).     cholecalciferol (VITAMIN D3) 25 MCG (1000 UNIT) tablet Take 3,000 Units by mouth daily.     glucose blood (ONE TOUCH ULTRA TEST) test strip 1 each by Other route daily. And lancets 1/day 250.00 100 each  12   insulin detemir (LEVEMIR) 100 UNIT/ML injection Inject 0.25 mLs (25 Units total) into the skin 2 (two) times daily. 10 mL 3   Insulin Syringe-Needle U-100 (SAFETY INSULIN SYRINGES) 30G X 5/16" 0.5 ML MISC 1 Syringe by Does not apply route daily. 30 each 3   levonorgestrel (MIRENA) 20 MCG/24HR IUD 1 each by Intrauterine route once.     Multiple Vitamin (ONE-A-DAY ESSENTIAL PO) Take 1 tablet by mouth daily.     omeprazole (PRILOSEC) 20 MG capsule Take 1 capsule (20 mg total) by mouth daily as needed (reflux).  90 capsule 3   zinc gluconate 50 MG tablet Take 50 mg by mouth daily.     EPINEPHrine 0.3 mg/0.3 mL IJ SOAJ injection Inject into the muscle.     HYDROcodone-acetaminophen (NORCO/VICODIN) 5-325 MG tablet Take 1 tablet by mouth every 4 (four) hours as needed for moderate pain. 12 tablet 0   ipratropium-albuterol (DUONEB) 0.5-2.5 (3) MG/3ML SOLN Take 3 mLs by nebulization 3 (three) times daily as needed (shortness of breath, chest tightness, wheeze). 180 mL 1    Allergies  Allergen Reactions   Bactrim [Sulfamethoxazole-Trimethoprim] Swelling   Neurontin [Gabapentin] Itching   Sulfa Antibiotics Other (See Comments)     Review of Systems Constitutional: No recent fever/chills/sweats Respiratory: No recent cough/bronchitis Cardiovascular: No chest pain Gastrointestinal: No recent nausea/vomiting/diarrhea Genitourinary: No UTI symptoms Hematologic/lymphatic:No history of coagulopathy or recent blood thinner use    Objective:   Blood pressure (!) 145/96, pulse 87, temperature 98.5 F (36.9 C), temperature source Oral, resp. rate 16, height 5\' 4"  (1.626 m), weight 72.6 kg, SpO2 100 %. CONSTITUTIONAL: Well-developed, well-nourished female in no acute distress.  HENT:  Normocephalic, atraumatic, External right and left ear normal. Oropharynx is clear and moist EYES: Conjunctivae and EOM are normal. Pupils are equal, round, and reactive to light. No scleral icterus.  NECK: Normal  range of motion, supple, no masses SKIN: Skin is warm and dry. No rash noted. Not diaphoretic. No erythema. No pallor. NEUROLOGIC: Alert and oriented to person, place, and time. Normal reflexes, muscle tone coordination. No cranial nerve deficit noted. PSYCHIATRIC: Normal mood and affect. Normal behavior. Normal judgment and thought content. CARDIOVASCULAR: Normal heart rate noted, regular rhythm RESPIRATORY: Effort and breath sounds normal, no problems with respiration noted ABDOMEN: Soft, nontender, nondistended. PELVIC: Deferred MUSCULOSKELETAL: Normal range of motion. No edema and no tenderness. 2+ distal pulses.    Labs: Results for orders placed or performed during the hospital encounter of 07/13/22 (from the past 336 hour(s))  Pregnancy, urine POC   Collection Time: 07/13/22 10:59 AM  Result Value Ref Range   Preg Test, Ur NEGATIVE NEGATIVE  Glucose, capillary   Collection Time: 07/13/22 11:01 AM  Result Value Ref Range   Glucose-Capillary 95 70 - 99 mg/dL   Comment 1 Notify RN    Comment 2 Document in Chart   CBC   Collection Time: 07/13/22 11:40 AM  Result Value Ref Range   WBC 7.5 4.0 - 10.5 K/uL   RBC 4.75 3.87 - 5.11 MIL/uL   Hemoglobin 12.1 12.0 - 15.0 g/dL   HCT 96.0 45.4 - 09.8 %   MCV 77.7 (L) 80.0 - 100.0 fL   MCH 25.5 (L) 26.0 - 34.0 pg   MCHC 32.8 30.0 - 36.0 g/dL   RDW 11.9 14.7 - 82.9 %   Platelets 270 150 - 400 K/uL   nRBC 0.0 0.0 - 0.2 %     Imaging Studies: US PELVIC COMPLETE W TRANSVAGINAL AND TORSION R/O  Result Date: 06/28/2022 CLINICAL DATA:  Ovarian torsion EXAM: TRANSABDOMINAL AND TRANSVAGINAL ULTRASOUND OF PELVIS DOPPLER ULTRASOUND OF OVARIES TECHNIQUE: Both transabdominal and transvaginal ultrasound examinations of the pelvis were performed. Transabdominal technique was performed for global imaging of the pelvis including uterus, ovaries, adnexal regions, and pelvic cul-de-sac. It was necessary to proceed with endovaginal exam following the  transabdominal exam to visualize the uterus, endometrium, ovaries. Color and duplex Doppler ultrasound was utilized to evaluate blood flow to the ovaries. COMPARISON:  CT abdomen and pelvis 06/28/2022 FINDINGS: Uterus Measurements: 11.9 x  5.6 x 7.2 cm = volume: 248 mL. Small 9 mm round hypoechoic area in the posterior right uterus likely a fibroid. Endometrium IUD in appropriate position. Right ovary Measurements: 2.6 x 1.6 x 1.5 cm = volume: 3.3 mL. Normal appearance/no adnexal mass. Left ovary Measurements: 2.1 x 1.3 x 1.6 cm = volume: 2.4 mL. The left ovary is difficult to visualize due to the superior/lateral position. Question edema versus mild fallopian tube dilation about the left ovary. Pulsed Doppler evaluation of both ovaries demonstrates normal low-resistance arterial and venous waveforms in the right ovary. The left ovary demonstrates arterial and venous waveforms however the arterial waveforms of a high resistance appearance. Other findings No abnormal free fluid. IMPRESSION: Arterial and venous waveforms are present in the left ovary however the arterial waveforms have a high resistive appearance. Question edema about the left ovary. These findings are nonspecific but could be seen with torsion-detorsion. Electronically Signed   By: Minerva Fester M.D.   On: 06/28/2022 20:00   CT ABDOMEN PELVIS W CONTRAST  Result Date: 06/28/2022 CLINICAL DATA:  Left lower quadrant and suprapubic abdominal pain, tender to palpation EXAM: CT ABDOMEN AND PELVIS WITH CONTRAST TECHNIQUE: Multidetector CT imaging of the abdomen and pelvis was performed using the standard protocol following bolus administration of intravenous contrast. RADIATION DOSE REDUCTION: This exam was performed according to the departmental dose-optimization program which includes automated exposure control, adjustment of the mA and/or kV according to patient size and/or use of iterative reconstruction technique. CONTRAST:  OMNIPAQUE  IOHEXOL 300 MG/ML  SOLN COMPARISON:  09/10/2021, 08/07/2021 FINDINGS: Lower chest: No acute pleural or parenchymal lung disease. Hepatobiliary: No focal liver abnormality is seen. Status post cholecystectomy. No biliary dilatation. Pancreas: Unremarkable. No pancreatic ductal dilatation or surrounding inflammatory changes. Spleen: Normal in size without focal abnormality. Adrenals/Urinary Tract: Chronic right renal cortical scarring. Stable benign left renal cyst does not require follow-up. No urinary tract calculi or obstructive uropathy. The adrenals are unremarkable. The bladder is decompressed, limiting its evaluation. Stomach/Bowel: No bowel obstruction or ileus. Normal appendix right lower quadrant. No bowel wall thickening or inflammatory change. Vascular/Lymphatic: Aortic atherosclerosis. No enlarged abdominal or pelvic lymph nodes. Reproductive: IUD within the endometrial cavity. The uterus is otherwise unremarkable. The right ovary measures approximately 2.2 x 1.6 cm and is unremarkable. The left ovary measures 3.6 x 2.5 cm, with mild surrounding fat stranding. If there is clinical concern for ovarian torsion, pelvic ultrasound including Doppler interrogation is recommended. Other: No free fluid or free intraperitoneal gas. No abdominal wall hernia. Musculoskeletal: No acute or destructive bony abnormalities. Reconstructed images demonstrate no additional findings. IMPRESSION: 1. Slight asymmetric enlargement of the left ovary, with surrounding fat stranding. This could reflect ovarian torsion in the appropriate clinical setting, and further evaluation with pelvic Doppler ultrasound may be useful. 2. Otherwise no acute intra-abdominal or intrapelvic process. Normal appendix. 3.  Aortic Atherosclerosis (ICD10-I70.0). Electronically Signed   By: Sharlet Salina M.D.   On: 06/28/2022 16:47    Assessment:    Pelvic pain  Suspected intermittent torsion of left ovary   Plan:   - Counseling: Procedure,  risks, reasons, benefits and complications (including injury to bowel, bladder, major blood vessel, ureter, bleeding, possibility of transfusion, infection, or fistula formation) reviewed in detail. Likelihood of success in alleviating the patient's condition was discussed. Routine postoperative instructions will be reviewed with the patient and her family in detail after surgery.  The patient concurred with the proposed plan, giving informed written consent for the  surgery.   - Preop testing reviewed. - Patient has been NPO since midnight.      Hildred Laser, MD [facility]

## 2022-07-13 NOTE — Anesthesia Procedure Notes (Addendum)
Procedure Name: Intubation Date/Time: 07/13/2022 2:14 PM  Performed by: Wesam Gearhart, [place], CRNAPre-anesthesia Checklist: Patient identified, Patient being monitored, Timeout performed, Emergency Drugs available and Suction available Patient Re-evaluated:Patient Re-evaluated prior to induction Oxygen Delivery Method: Circle system utilized Preoxygenation: Pre-oxygenation with 100% oxygen Induction Type: IV induction Ventilation: Mask ventilation without difficulty Laryngoscope Size: Mac and 3 Grade View: Grade I Tube type: Oral Tube size: 7.0 mm Number of attempts: 1 Airway Equipment and Method: Stylet Placement Confirmation: ETT inserted through vocal cords under direct vision, positive ETCO2 and breath sounds checked- equal and bilateral Secured at: 22 cm Tube secured with: Tape Dental Injury: Teeth and Oropharynx as per pre-operative assessment

## 2022-07-13 NOTE — Transfer of Care (Signed)
Immediate Anesthesia Transfer of Care Note  Patient: Rose [name]  Procedure(s) Performed: LAPAROSCOPY DIAGNOSTIC (Left: Pelvis)  Patient Location: PACU  Anesthesia Type:General  Level of Consciousness: awake and alert   Airway & Oxygen Therapy: Patient Spontanous Breathing and Patient connected to face mask oxygen  Post-op Assessment: Report given to RN and Post -op Vital signs reviewed and stable  Post vital signs: Reviewed and stable  Last Vitals:  Vitals Value Taken Time  BP 125/97 07/13/22 1523  Temp    Pulse 97 07/13/22 1525  Resp 24 07/13/22 1525  SpO2 97 % 07/13/22 1525  Vitals shown include unvalidated device data.  Last Pain:  Vitals:   07/13/22 1106  TempSrc: Oral  PainSc: 3          Complications: No notable events documented.

## 2022-07-13 NOTE — Progress Notes (Signed)
   07/13/22 1300  Spiritual Encounters  Type of Visit Initial  Care provided to: Pt and family  Referral source Chaplain assessment  Reason for visit Routine spiritual support  OnCall Visit No  Spiritual Framework  Presenting Themes Courage hope and growth  Patient Stress Factors None identified  Family Stress Factors None identified  Interventions  Spiritual Care Interventions Made Established relationship of care and support;Compassionate presence;Encouragement  Intervention Outcomes  Outcomes Awareness of support  Spiritual Care Plan  Spiritual Care Issues Still Outstanding No further spiritual care needs at this time (see row info)   Chaplain spiritual support services remain available as the need arises.

## 2022-07-14 ENCOUNTER — Ambulatory Visit: Payer: BC Managed Care – PPO | Admitting: Certified Nurse Midwife

## 2022-07-14 ENCOUNTER — Encounter: Payer: Self-pay | Admitting: Obstetrics and Gynecology

## 2022-07-14 ENCOUNTER — Telehealth: Payer: Self-pay | Admitting: Obstetrics and Gynecology

## 2022-07-14 NOTE — Telephone Encounter (Signed)
[name], is calling to follow up on Authorization for the patient. You can Email her [name] if that is better. Per [name]- She reacted out to schedulers for pre authorization for [facility] but we authorization for the clinic. Please give a call. [phone]

## 2022-07-14 NOTE — Anesthesia Postprocedure Evaluation (Signed)
Anesthesia Post Note  Patient: Rose [name]  Procedure(s) Performed: LAPAROSCOPY DIAGNOSTIC, EXCISION OF ENDOMETRIOSIS (Left: Pelvis)  Patient location during evaluation: PACU Anesthesia Type: General Level of consciousness: awake and alert Pain management: pain level controlled Vital Signs Assessment: post-procedure vital signs reviewed and stable Respiratory status: spontaneous breathing, nonlabored ventilation, respiratory function stable and patient connected to nasal cannula oxygen Cardiovascular status: blood pressure returned to baseline and stable Postop Assessment: no apparent nausea or vomiting Anesthetic complications: no   No notable events documented.   Last Vitals:  Vitals:   07/13/22 1741 07/13/22 1750  BP:  115/86  Pulse:  83  Resp:  18  Temp: 36.4 C   SpO2:  98%    Last Pain:  Vitals:   07/13/22 1741  TempSrc: Temporal  PainSc:                  Louie Boston

## 2022-07-16 ENCOUNTER — Encounter: Payer: Self-pay | Admitting: Obstetrics and Gynecology

## 2022-07-16 ENCOUNTER — Telehealth: Payer: Self-pay | Admitting: Obstetrics and Gynecology

## 2022-07-16 NOTE — Telephone Encounter (Signed)
Yes, that's fine 

## 2022-07-16 NOTE — Telephone Encounter (Signed)
Patient called wanted to know if she could get a note to go back to work on Monday without any restrictions.  Please advise

## 2022-07-16 NOTE — Telephone Encounter (Signed)
The patient called in today from follow up from message left with after hour Access Nurse line. The patient report "she recently had surgery and her belly button incision was bleeding but then it stopped and there is dried blood present. What does she need to do"?I secure Dr. Valentino Saxon while the patient was on hold to advise the patient on what to do. Her message back to be" Advise to place a bandage over the area for the next few days. If bleeding continues, should come in to be assessed". The patient is aware word for word. She knows she is scheduled for post op follow up on 7/18 with Dr. Valentino Saxon.

## 2022-07-23 ENCOUNTER — Ambulatory Visit (INDEPENDENT_AMBULATORY_CARE_PROVIDER_SITE_OTHER): Payer: PRIVATE HEALTH INSURANCE | Admitting: Obstetrics and Gynecology

## 2022-07-23 ENCOUNTER — Encounter: Payer: Self-pay | Admitting: Obstetrics and Gynecology

## 2022-07-23 VITALS — BP 154/90 | HR 92 | Resp 16 | Ht 64.0 in | Wt 180.7 lb

## 2022-07-23 DIAGNOSIS — R102 Pelvic and perineal pain: Secondary | ICD-10-CM

## 2022-07-23 DIAGNOSIS — Z9889 Other specified postprocedural states: Secondary | ICD-10-CM

## 2022-07-23 DIAGNOSIS — Z4889 Encounter for other specified surgical aftercare: Secondary | ICD-10-CM

## 2022-07-23 DIAGNOSIS — N808 Other endometriosis: Secondary | ICD-10-CM

## 2022-07-23 MED ORDER — ORILISSA 150 MG PO TABS: 1.0000 | ORAL_TABLET | Freq: Every day | ORAL | 11 refills | Status: DC

## 2022-07-23 NOTE — Progress Notes (Signed)
    OBSTETRICS/GYNECOLOGY POST-OPERATIVE CLINIC VISIT  Subjective:     Rose [name] is a 47 y.o. G59P1011 female who presents to the clinic 10 days status post LAPAROSCOPY DIAGNOSTIC, EXCISION OF ENDOMETRIOSIS  for pelvic pain and possible left ovarian torsion.  Torsion was ruled out but patient with significant pelvic adhesions, and possible endometriosis implants.  Eating a regular diet without difficulty. Bowel movements are normal. Patient notes that she is still noting some of the pain she was having prior to surgery (has improved slightly).   The following portions of the patient's history were reviewed and updated as appropriate: allergies, current medications, past family history, past medical history, past social history, past surgical history, and problem list.  Review of Systems Pertinent items noted in HPI and remainder of comprehensive ROS otherwise negative.   Objective:   Blood pressure (!) 154/90, pulse 92, resp. rate 16, height 5\' 4"  (1.626 m), weight 180 lb 11.2 oz (82 kg).  Body mass index is 31.02 kg/m.  General:  alert and no distress  Abdomen: soft, bowel sounds active, non-tender  Incision:   healing well, no drainage, no erythema, no hernia, no seroma, no swelling, no dehiscence, incision well approximated    Pathology:  Diagnosis:  1. Ligament, left broad biopsy - MESOTHELIAL LINED FIBROUS TISSUE WITH SIGNIFICANT THERMAL ARTIFACT 2. Ligament, left round biopsy - FIBROUS TISSUE WITH SIGNIFICANT THERMAL ARTIFACT   Labs:     Latest Ref Rng & Units 06/28/2022    2:06 PM  CMP  Glucose 70 - 99 mg/dL 161   BUN 6 - 20 mg/dL 11   Creatinine 0.96 - 1.00 mg/dL 0.45   Sodium 409 - 811 mmol/L 139   Potassium 3.5 - 5.1 mmol/L 3.4   Chloride 98 - 111 mmol/L 106   CO2 22 - 32 mmol/L 26   Calcium 8.9 - 10.3 mg/dL 8.7   Total Protein 6.5 - 8.1 g/dL 7.2   Total Bilirubin 0.3 - 1.2 mg/dL 0.4   Alkaline Phos 38 - 126 U/L 38   AST 15 - 41 U/L 17   ALT 0 - 44 U/L  12      Assessment:   1. Postoperative visit   2. S/P laparoscopy   3. Pelvic pain   4. Other endometriosis    Plan:   1. Continue any current medications as instructed by provider. 2. Wound care discussed. 3. Operative findings again reviewed. Pathology report discussed. Although no endometriosis documented on pathology, patient with several other findings that would suggest including signficant pelvic adhesions, small hemoperitoneum and several powder burn lesions. Still would recommend treating as endometriosis. Discussed options for further management as she is still noting pain, including hormonal suppression with GnRH agonist, vs consideration of hysterectomy. Advised on risks and benefits of both options. Currently has IUD in place. Patient willing to try medication first. Will prescribe Orilissa 150 mg daily. Recent LFTs normal.  4. Activity restrictions: none 5. Anticipated return to work: now, if applicable.  6. Follow up: 1  month for f/u of symptoms.       Hildred Laser, MD [facility]

## 2022-08-20 ENCOUNTER — Ambulatory Visit (INDEPENDENT_AMBULATORY_CARE_PROVIDER_SITE_OTHER): Payer: PRIVATE HEALTH INSURANCE | Admitting: Obstetrics and Gynecology

## 2022-08-20 ENCOUNTER — Encounter: Payer: Self-pay | Admitting: Obstetrics and Gynecology

## 2022-08-20 VITALS — BP 155/103 | HR 99 | Wt 175.4 lb

## 2022-08-20 DIAGNOSIS — R030 Elevated blood-pressure reading, without diagnosis of hypertension: Secondary | ICD-10-CM

## 2022-08-20 DIAGNOSIS — N808 Other endometriosis: Secondary | ICD-10-CM

## 2022-08-20 DIAGNOSIS — R102 Pelvic and perineal pain: Secondary | ICD-10-CM

## 2022-08-20 NOTE — Progress Notes (Signed)
    GYNECOLOGY PROGRESS NOTE  Subjective:    [name], female    DOB: [birth year], 47 y.o.   MRN: [id number]  HPI  Patient is a 47 y.o. G19P1011 female who presents for 1 month follow up of Orilissa for pelvic pain and suspected endometriosis.  She also has a Mirena IUD in place.  She recently had abdominal surgery with lysis of adhesions. Reports that her pain has decreased, now currently down to 5/10.  Does feel like the medication is starting to help. Denies any major side effects at this time, however does note some mild in crease in her irritability and hot flushes.   The following portions of the patient's history were reviewed and updated as appropriate: allergies, current medications, past family history, past medical history, past social history, past surgical history, and problem list.  Review of Systems Pertinent items noted in HPI and remainder of comprehensive ROS otherwise negative.   Objective:   Blood pressure (!) 150/101, pulse (!) 103, weight 175 lb 6.4 oz (79.6 kg). Body mass index is 30.11 kg/m.  Repeat BP 155/103 General appearance: alert and no distress Remainder of exam deferred.   Labs:  Lab Results  Component Value Date   ALT 12 06/28/2022   AST 17 06/28/2022   ALKPHOS 38 06/28/2022   BILITOT 0.4 06/28/2022    Assessment:   1. Pelvic pain   2. Other endometriosis   3. Elevated BP without diagnosis of hypertension      Plan:   - Patient reports some improvement in her symptoms on first month of Orilissa.  Will continue to follow. Advised to follow up at the end of 3 months to reassess for mild side effects and further improvement of her pain symptoms.  - Patient with elevated BP today.  Last several visits noting elevated BPs as well. Initially thought to be due to patient's history of moderate pain but now may be developing HTN. Patient does note a family h/o HTN. Advised to f/u with PCP.  - Patient is overdue for wellness exam.  Will schedule to perform with follow up.   Hildred Laser, MD [facility]

## 2022-09-18 ENCOUNTER — Encounter: Payer: Self-pay | Admitting: Obstetrics and Gynecology

## 2022-09-24 ENCOUNTER — Ambulatory Visit: Payer: BC Managed Care – PPO | Admitting: Urology

## 2022-10-07 ENCOUNTER — Encounter: Payer: Self-pay | Admitting: Obstetrics and Gynecology

## 2022-10-07 ENCOUNTER — Other Ambulatory Visit: Payer: Self-pay | Admitting: Urology

## 2022-10-07 DIAGNOSIS — R3129 Other microscopic hematuria: Secondary | ICD-10-CM

## 2022-10-07 NOTE — Progress Notes (Signed)
10/13/22 4:40 PM   Rose [name] 1975-03-24 [id number]  Referring provider:  Margarita Mail, DO [address] [place],  [place] [address]  Urological history  Nephrolithiasis  - S/p ESWL in 01/2019 -right urs (2021)  - office visit with Dr. Richardo Hanks on 11/09/2019, she was experiencing bilateral flank pain and bilateral groin pain associated with urgency, frequency, dysuria, pelvic pain and nausea - RUS completed on November 15, 2019 noted no hydronephrosis or stones, but a slight interval increase in the complex cyst in the inferior pole of the left kidney that measured 1.3 x 1.3 x 1.6 cm in February 2021 and is now 1.4 x 1.6 x 1.6 cm on this exam. - At ED visit in 07/2020 CT scans on 7/11 and 7/29 that showed no evidence of ureteral stones or renal stones or hydronephrosis -CT (06/2022) no stones seen  2. Left renal cyst -CT (06/2022) stable benign left renal cyst  Chief Complaint  Patient presents with   Follow-up    HPI: Rose Dean Labat is a 47 y.o.female who presents today for yearly follow up.  Previous record reviewed.   KUB no stones seen   UA with hematuria, bacteriuria and squames.    She has no specific complaints today.  She has been having some suprapubic pain that has been intermittent, but she attributes that to her endometriosis.  Patient denies any modifying or aggravating factors.  Patient denies any recent UTI's, gross hematuria, dysuria or suprapubic/flank pain.  Patient denies any fevers, chills, nausea or vomiting.    She states she is currently not on her menstrual cycle.  PMH: Past Medical History:  Diagnosis Date   ADHD    Anxiety    Diabetes mellitus without complication (HCC)    Family history of adverse reaction to anesthesia    SISTER-TOO MUCH ANESTHESIA FOR C-SECTION   GERD (gastroesophageal reflux disease)    History of kidney stones    Neck pain    Right leg pain     Surgical History: Past Surgical History:   Procedure Laterality Date   CESAREAN SECTION     CHOLECYSTECTOMY     CYSTOSCOPY W/ RETROGRADES  06/29/2019   Procedure: CYSTOSCOPY WITH RETROGRADE PYELOGRAM;  Surgeon: Sondra Come, MD;  Location: [facility];  Service: Urology;;   CYSTOSCOPY WITH RETROGRADE PYELOGRAM, URETEROSCOPY AND STENT PLACEMENT Left 01/17/2019   Procedure: CYSTOSCOPY WITH RETROGRADE PYELOGRAM, URETEROSCOPY AND STENT PLACEMENT;  Surgeon: Riki Altes, MD;  Location: [facility];  Service: Urology;  Laterality: Left;   CYSTOSCOPY/URETEROSCOPY/HOLMIUM LASER/STENT PLACEMENT Right 06/29/2019   Procedure: CYSTOSCOPY/URETEROSCOPY/HOLMIUM LASER/STENT PLACEMENT;  Surgeon: Sondra Come, MD;  Location: [facility];  Service: Urology;  Laterality: Right;   EXTRACORPOREAL SHOCK WAVE LITHOTRIPSY Right 06/01/2019   Procedure: EXTRACORPOREAL SHOCK WAVE LITHOTRIPSY (ESWL);  Surgeon: Sondra Come, MD;  Location: [facility];  Service: Urology;  Laterality: Right;   kidney stone removal  01/21, 05/21, 06/21   LAPAROSCOPY N/A 08/24/2013   Procedure: LAPAROSCOPY OPERATIVE with lysis of adhesions ;  Surgeon: Zelphia Cairo, MD;  Location: [facility];  Service: Gynecology;  Laterality: N/A;   LAPAROSCOPY Left 07/13/2022   Procedure: LAPAROSCOPY DIAGNOSTIC, EXCISION OF ENDOMETRIOSIS;  Surgeon: Hildred Laser, MD;  Location: [facility];  Service: Gynecology;  Laterality: Left;   STONE EXTRACTION WITH BASKET Left 01/17/2019   Procedure: STONE EXTRACTION WITH BASKET;  Surgeon: Riki Altes, MD;  Location: [facility];  Service: Urology;  Laterality: Left;   wrist, cyst removal  Home Medications:  Allergies as of 10/08/2022       Reactions   Bactrim [sulfamethoxazole-trimethoprim] Swelling   Neurontin [gabapentin] Itching   Sulfa Antibiotics Other (See Comments)        Medication List        Accurate as of October 08, 2022 11:59 PM. If you have any questions, ask your nurse or doctor.          albuterol 108 (90 Base) MCG/ACT  inhaler Commonly known as: VENTOLIN HFA Inhale 2 puffs into the lungs every 6 (six) hours as needed for wheezing or shortness of breath.   Berberine HCI 500 MG Caps Generic drug: Berberine Chloride Take 1 tablet by mouth 2 (two) times daily.   cholecalciferol 25 MCG (1000 UNIT) tablet Commonly known as: VITAMIN D3 Take 3,000 Units by mouth daily.   docusate sodium 100 MG capsule Commonly known as: COLACE Take 1 capsule (100 mg total) by mouth 2 (two) times daily as needed.   EPINEPHrine 0.3 mg/0.3 mL Soaj injection Commonly known as: EPI-PEN Inject into the muscle.   glucose blood test strip Commonly known as: ONE TOUCH ULTRA TEST 1 each by Other route daily. And lancets 1/day 250.00   HYDROcodone-acetaminophen 5-325 MG tablet Commonly known as: NORCO/VICODIN Take 1-2 tablets by mouth every 6 (six) hours as needed for moderate pain.   ibuprofen 600 MG tablet Commonly known as: ADVIL Take 1 tablet (600 mg total) by mouth every 6 (six) hours as needed.   insulin detemir 100 UNIT/ML injection Commonly known as: LEVEMIR Inject 0.25 mLs (25 Units total) into the skin 2 (two) times daily.   Insulin Syringe-Needle U-100 30G X 5/16" 0.5 ML Misc Commonly known as: Safety Insulin Syringes 1 Syringe by Does not apply route daily.   ipratropium-albuterol 0.5-2.5 (3) MG/3ML Soln Commonly known as: DUONEB Take 3 mLs by nebulization 3 (three) times daily as needed (shortness of breath, chest tightness, wheeze).   levonorgestrel 20 MCG/24HR IUD Commonly known as: MIRENA 1 each by Intrauterine route once.   LUBRICATING EYE DROPS OP Place 1 drop into both eyes daily as needed (dry eyes).   omeprazole 20 MG capsule Commonly known as: PRILOSEC Take 1 capsule (20 mg total) by mouth daily as needed (reflux).   ONE-A-DAY ESSENTIAL PO Take 1 tablet by mouth daily.   Orilissa 150 MG Tabs Generic drug: Elagolix Sodium Take 1 tablet (150 mg total) by mouth daily.   simethicone 80  MG chewable tablet Commonly known as: Gas-X Chew 1 tablet (80 mg total) by mouth 4 (four) times daily as needed for flatulence (bloating).   vitamin C 1000 MG tablet Take 2,000 mg by mouth daily.   zinc gluconate 50 MG tablet Take 50 mg by mouth daily.        Allergies:  Allergies  Allergen Reactions   Bactrim [Sulfamethoxazole-Trimethoprim] Swelling   Neurontin [Gabapentin] Itching   Sulfa Antibiotics Other (See Comments)    Family History: Family History  Problem Relation Age of Onset   Aneurysm Mother    Blindness Father    High blood pressure Father    Hypertension Sister    Hypertension Sister    Diabetes Neg Hx     Social History:  reports that she has quit smoking. Her smoking use included e-cigarettes. She has been exposed to tobacco smoke. She has never used smokeless tobacco. She reports that she does not currently use alcohol after a past usage of about 1.0 standard drink of alcohol per week.  She reports that she does not currently use drugs after having used the following drugs: Marijuana.   Physical Exam: BP 126/86   Pulse (!) 103   Constitutional:  Well nourished. Alert and oriented, No acute distress. HEENT: [place] AT, moist mucus membranes.  Trachea midline Cardiovascular: No clubbing, cyanosis, or edema. Respiratory: Normal respiratory effort, no increased work of breathing. Neurologic: Grossly intact, no focal deficits, moving all 4 extremities. Psychiatric: Normal mood and affect.    Laboratory Data: Urinalysis Component     Latest Ref Rng 10/08/2022  Specific Gravity, UA     1.005 - 1.030  >1.030 (H)   pH, UA     5.0 - 7.5  5.5   Color, UA     Yellow  Yellow   Appearance Ur     Clear  Hazy !   Leukocytes,UA     Negative  Negative   Protein,UA     Negative/Trace  Trace !   Glucose, UA     Negative  1+ !   Ketones, UA     Negative  Negative   RBC, UA     Negative  Trace !   Bilirubin, UA     Negative  Negative   Urobilinogen, Ur      0.2 - 1.0 mg/dL 0.2   Nitrite, UA     Negative  Negative   Microscopic Examination See below:     Legend: (H) High ! Abnormal  Component     Latest Ref Rng 10/08/2022  WBC, UA     0 - 5 /hpf 0-5   RBC, Urine     0 - 2 /hpf 11-30 !   Epithelial Cells (non renal)     0 - 10 /hpf >10 !   Bacteria, UA     None seen/Few  Many !   Mucus, UA     Not Estab.  Present !   Casts     None seen /lpf Present !   Cast Type     N/A  Hyaline casts     Legend: ! Abnormal I have reviewed the labs.   Pertinent Imaging: KUB, No stones seen, radiologist interpretation still pending  I have independently reviewed the films.  See HPI.    Assessment & Plan:    Left indeterminate renal mass -Benign renal cyst -No further follow-up is warranted  2.  Microscopic hematuria -Today's UA with micro heme -She does not report any gross hematuria -She has been having some vague pelvic pain which may be due to indolent infection, so I will send the urine for culture -If urine culture is positive for infection, we will go ahead and treat with culture appropriate antibiotics and have her return in 1 month for recheck to ensure that the hematuria clears -If urine culture returns negative, we will have her return for a cath UA -It is important that she follows up for these appointments as we need to ensure that the blood clears from the urine, if it persists will need to repeat hematuria workup to evaluate for any radiolucent stones or malignancy  3. Nephrolithiasis -Patient will follow-up in 1 year for KUB to monitor for any stone production  Return for pending urine cutlure results .  Cloretta Ned   [facility] [address] [place], [place] [address] [phone]

## 2022-10-08 ENCOUNTER — Ambulatory Visit
Admission: RE | Admit: 2022-10-08 | Discharge: 2022-10-08 | Disposition: A | Discharge status: Home / Self Care | Payer: PRIVATE HEALTH INSURANCE | Attending: Urology | Admitting: Urology

## 2022-10-08 ENCOUNTER — Ambulatory Visit (INDEPENDENT_AMBULATORY_CARE_PROVIDER_SITE_OTHER): Payer: PRIVATE HEALTH INSURANCE | Admitting: Urology

## 2022-10-08 ENCOUNTER — Ambulatory Visit
Admission: RE | Admit: 2022-10-08 | Discharge: 2022-10-08 | Disposition: A | Discharge status: Home / Self Care | Payer: PRIVATE HEALTH INSURANCE | Source: Ambulatory Visit | Attending: Urology | Admitting: Urology

## 2022-10-08 ENCOUNTER — Encounter: Payer: Self-pay | Admitting: Urology

## 2022-10-08 VITALS — BP 126/86 | HR 103

## 2022-10-08 DIAGNOSIS — R3129 Other microscopic hematuria: Secondary | ICD-10-CM

## 2022-10-08 DIAGNOSIS — N281 Cyst of kidney, acquired: Secondary | ICD-10-CM | POA: Diagnosis not present

## 2022-10-08 DIAGNOSIS — Z87442 Personal history of urinary calculi: Secondary | ICD-10-CM | POA: Diagnosis not present

## 2022-10-08 DIAGNOSIS — N200 Calculus of kidney: Secondary | ICD-10-CM

## 2022-10-08 LAB — URINALYSIS, COMPLETE
Bilirubin, UA: NEGATIVE
Ketones, UA: NEGATIVE
Leukocytes,UA: NEGATIVE
Nitrite, UA: NEGATIVE
Specific Gravity, UA: >1.03 — ABNORMAL HIGH (ref 1.005–1.030)
Urobilinogen, Ur: 0.2 mg/dL (ref 0.2–1.0)
pH, UA: 5.5 (ref 5.0–7.5)

## 2022-10-08 LAB — MICROSCOPIC EXAMINATION: Epithelial Cells (non renal): >10 /[HPF] — AB (ref 0–10)

## 2022-10-08 NOTE — Telephone Encounter (Signed)
Melanie from [facility] calling to f/u on fax sent for orilissa 150mg ; they haven't recv'd it.  The pharm it comes from is [facility]; [address] [place], [place] [address].  If questions call [phone]

## 2022-10-09 ENCOUNTER — Other Ambulatory Visit: Payer: Self-pay

## 2022-10-09 MED ORDER — ORILISSA 150 MG PO TABS: 1.0000 | ORAL_TABLET | Freq: Every day | ORAL | 11 refills | Status: DC

## 2022-10-09 NOTE — Telephone Encounter (Signed)
Found it in [employer] information [employer] and faxed to to [facility].

## 2022-10-11 LAB — CULTURE, URINE COMPREHENSIVE

## 2022-10-18 ENCOUNTER — Other Ambulatory Visit: Payer: Self-pay | Admitting: Urology

## 2022-10-18 DIAGNOSIS — R3129 Other microscopic hematuria: Secondary | ICD-10-CM

## 2022-10-18 NOTE — Progress Notes (Unsigned)
10/19/22 12:03 PM   Rose [name] [birth year] [id number]  Referring provider:  Bary Leriche, MD No address on file  Urological history  Nephrolithiasis  - S/p ESWL in 01/2019 -right urs (2021)  - office visit with Dr. Richardo Hanks on 11/09/2019, she was experiencing bilateral flank pain and bilateral groin pain associated with urgency, frequency, dysuria, pelvic pain and nausea - RUS completed on November 15, 2019 noted no hydronephrosis or stones, but a slight interval increase in the complex cyst in the inferior pole of the left kidney that measured 1.3 x 1.3 x 1.6 cm in February 2021 and is now 1.4 x 1.6 x 1.6 cm on this exam. - At ED visit in 07/2020 CT scans on 7/11 and 7/29 that showed no evidence of ureteral stones or renal stones or hydronephrosis -CT (06/2022) no stones seen  2. Left renal cyst -CT (06/2022) stable benign left renal cyst  Chief Complaint  Patient presents with   Hematuria    HPI: Rose [name] is a 47 y.o.female who presents today for catheterized specimen.   Previous record reviewed.   Patient was found to have microscopic hematuria but with a large amount of squames during her annual visit on 10/08/2022.   Her urine culture grew out MUF.    She presents today for cath UA.    In and Out Catheterization  Patient is present today for a I & O catheterization due to contaminated specimen. Patient was cleaned and prepped in a sterile fashion with betadine . A 14 FR cath was inserted no complications were noted , 70 ml of urine return was noted, urine was yellow in color. A clean urine sample was collected for urinalysis. Bladder was drained  And catheter was removed with out difficulty.    Performed by myself and Evlyn Kanner, CMA  Catheterized UA was unremarkable  PMH: Past Medical History:  Diagnosis Date   ADHD    Anxiety    Diabetes mellitus without complication (HCC)    Family history of adverse reaction to anesthesia     SISTER-TOO MUCH ANESTHESIA FOR C-SECTION   GERD (gastroesophageal reflux disease)    History of kidney stones    Neck pain    Right leg pain     Surgical History: Past Surgical History:  Procedure Laterality Date   CESAREAN SECTION     CHOLECYSTECTOMY     CYSTOSCOPY W/ RETROGRADES  06/29/2019   Procedure: CYSTOSCOPY WITH RETROGRADE PYELOGRAM;  Surgeon: Sondra Come, MD;  Location: [facility];  Service: Urology;;   CYSTOSCOPY WITH RETROGRADE PYELOGRAM, URETEROSCOPY AND STENT PLACEMENT Left 01/17/2019   Procedure: CYSTOSCOPY WITH RETROGRADE PYELOGRAM, URETEROSCOPY AND STENT PLACEMENT;  Surgeon: Riki Altes, MD;  Location: [facility];  Service: Urology;  Laterality: Left;   CYSTOSCOPY/URETEROSCOPY/HOLMIUM LASER/STENT PLACEMENT Right 06/29/2019   Procedure: CYSTOSCOPY/URETEROSCOPY/HOLMIUM LASER/STENT PLACEMENT;  Surgeon: Sondra Come, MD;  Location: [facility];  Service: Urology;  Laterality: Right;   EXTRACORPOREAL SHOCK WAVE LITHOTRIPSY Right 06/01/2019   Procedure: EXTRACORPOREAL SHOCK WAVE LITHOTRIPSY (ESWL);  Surgeon: Sondra Come, MD;  Location: [facility];  Service: Urology;  Laterality: Right;   kidney stone removal  01/21, 05/21, 06/21   LAPAROSCOPY N/A 08/24/2013   Procedure: LAPAROSCOPY OPERATIVE with lysis of adhesions ;  Surgeon: Zelphia Cairo, MD;  Location: [facility];  Service: Gynecology;  Laterality: N/A;   LAPAROSCOPY Left 07/13/2022   Procedure: LAPAROSCOPY DIAGNOSTIC, EXCISION OF ENDOMETRIOSIS;  Surgeon: Hildred Laser, MD;  Location: [facility];  Service: Gynecology;  Laterality: Left;   STONE EXTRACTION WITH BASKET Left 01/17/2019   Procedure: STONE EXTRACTION WITH BASKET;  Surgeon: Riki Altes, MD;  Location: [facility];  Service: Urology;  Laterality: Left;   wrist, cyst removal      Home Medications:  Allergies as of 10/19/2022       Reactions   Bactrim [sulfamethoxazole-trimethoprim] Swelling   Neurontin [gabapentin] Itching   Sulfa Antibiotics Other (See  Comments)        Medication List        Accurate as of October 19, 2022 12:03 PM. If you have any questions, ask your nurse or doctor.          albuterol 108 (90 Base) MCG/ACT inhaler Commonly known as: VENTOLIN HFA Inhale 2 puffs into the lungs every 6 (six) hours as needed for wheezing or shortness of breath.   Berberine HCI 500 MG Caps Generic drug: Berberine Chloride Take 1 tablet by mouth 2 (two) times daily.   cholecalciferol 25 MCG (1000 UNIT) tablet Commonly known as: VITAMIN D3 Take 3,000 Units by mouth daily.   docusate sodium 100 MG capsule Commonly known as: COLACE Take 1 capsule (100 mg total) by mouth 2 (two) times daily as needed.   EPINEPHrine 0.3 mg/0.3 mL Soaj injection Commonly known as: EPI-PEN Inject into the muscle.   glucose blood test strip Commonly known as: ONE TOUCH ULTRA TEST 1 each by Other route daily. And lancets 1/day 250.00   HYDROcodone-acetaminophen 5-325 MG tablet Commonly known as: NORCO/VICODIN Take 1-2 tablets by mouth every 6 (six) hours as needed for moderate pain.   ibuprofen 600 MG tablet Commonly known as: ADVIL Take 1 tablet (600 mg total) by mouth every 6 (six) hours as needed.   insulin detemir 100 UNIT/ML injection Commonly known as: LEVEMIR Inject 0.25 mLs (25 Units total) into the skin 2 (two) times daily.   Insulin Syringe-Needle U-100 30G X 5/16" 0.5 ML Misc Commonly known as: Safety Insulin Syringes 1 Syringe by Does not apply route daily.   ipratropium-albuterol 0.5-2.5 (3) MG/3ML Soln Commonly known as: DUONEB Take 3 mLs by nebulization 3 (three) times daily as needed (shortness of breath, chest tightness, wheeze).   levonorgestrel 20 MCG/24HR IUD Commonly known as: MIRENA 1 each by Intrauterine route once.   LUBRICATING EYE DROPS OP Place 1 drop into both eyes daily as needed (dry eyes).   omeprazole 20 MG capsule Commonly known as: PRILOSEC Take 1 capsule (20 mg total) by mouth daily as needed  (reflux).   ONE-A-DAY ESSENTIAL PO Take 1 tablet by mouth daily.   Orilissa 150 MG Tabs Generic drug: Elagolix Sodium Take 1 tablet (150 mg total) by mouth daily.   simethicone 80 MG chewable tablet Commonly known as: Gas-X Chew 1 tablet (80 mg total) by mouth 4 (four) times daily as needed for flatulence (bloating).   vitamin C 1000 MG tablet Take 2,000 mg by mouth daily.   zinc gluconate 50 MG tablet Take 50 mg by mouth daily.        Allergies:  Allergies  Allergen Reactions   Bactrim [Sulfamethoxazole-Trimethoprim] Swelling   Neurontin [Gabapentin] Itching   Sulfa Antibiotics Other (See Comments)    Family History: Family History  Problem Relation Age of Onset   Aneurysm Mother    Blindness Father    High blood pressure Father    Hypertension Sister    Hypertension Sister    Diabetes Neg Hx     Social History:  reports that she  has quit smoking. Her smoking use included e-cigarettes. She has been exposed to tobacco smoke. She has never used smokeless tobacco. She reports that she does not currently use alcohol after a past usage of about 1.0 standard drink of alcohol per week. She reports that she does not currently use drugs after having used the following drugs: Marijuana.   Physical Exam: BP (!) 151/94 (BP Location: Left Arm, Patient Position: Sitting, Cuff Size: Normal)   Pulse 99   Ht 5\' 4"  (1.626 m)   BMI 30.11 kg/m   Constitutional:  Well nourished. Alert and oriented, No acute distress. HEENT: [place] AT, moist mucus membranes.  Trachea midline, no masses. Cardiovascular: No clubbing, cyanosis, or edema. Respiratory: Normal respiratory effort, no increased work of breathing. GU: No CVA tenderness.  No bladder fullness or masses.  Normal external genitalia.  Normal urethral meatus, no lesions, no prolapse, no discharge.   No urethral masses, tenderness and/or tenderness. No bladder fullness, tenderness or masses. Normal vagina mucosa.  Neurologic: Grossly  intact, no focal deficits, moving all 4 extremities. Psychiatric: Normal mood and affect.      Laboratory Data: Urinalysis Component     Latest Ref Rng 10/19/2022  Glucose, UA     NEGATIVE mg/dL NEGATIVE   WBC, UA     0 - 5 WBC/hpf 0-5   Bacteria, UA     NONE SEEN  FEW !   Color, Urine     YELLOW  YELLOW   Appearance     CLEAR  CLEAR   Specific Gravity, Urine     1.005 - 1.030  1.025   pH     5.0 - 8.0  6.0   Hgb urine dipstick     NEGATIVE  NEGATIVE   Bilirubin Urine     NEGATIVE  NEGATIVE   Ketones, ur     NEGATIVE mg/dL NEGATIVE   Protein     NEGATIVE mg/dL NEGATIVE   Nitrite     NEGATIVE  NEGATIVE   Leukocytes,Ua     NEGATIVE  NEGATIVE   RBC / HPF     0 - 5 RBC/hpf 0-5   Squamous Epithelial / HPF     0 - 5 /HPF 0-5   Mucus PRESENT     Legend: ! Abnormal I have reviewed the labs.   Pertinent Imaging: N/A  Assessment & Plan:    1.  Microscopic hematuria -Today's CATH UA w/o  micro heme -She does not report any gross hematuria -Micro heme likely due to contamination, she will return next year for follow-up UA and KUB -She reported a gross heme in the Intraven  Return in about 1 year (around 10/19/2023) for UA and KUB .  Cloretta Ned   [facility] [address], [address] [place], [place] [address] [phone]

## 2022-10-19 ENCOUNTER — Encounter: Payer: Self-pay | Admitting: Urology

## 2022-10-19 ENCOUNTER — Ambulatory Visit (INDEPENDENT_AMBULATORY_CARE_PROVIDER_SITE_OTHER): Payer: PRIVATE HEALTH INSURANCE | Admitting: Urology

## 2022-10-19 ENCOUNTER — Other Ambulatory Visit
Admission: RE | Admit: 2022-10-19 | Discharge: 2022-10-19 | Disposition: A | Discharge status: Home / Self Care | Payer: PRIVATE HEALTH INSURANCE | Source: Ambulatory Visit | Attending: Urology | Admitting: Urology

## 2022-10-19 VITALS — BP 151/94 | HR 99 | Ht 64.0 in

## 2022-10-19 DIAGNOSIS — R3129 Other microscopic hematuria: Secondary | ICD-10-CM | POA: Diagnosis present

## 2022-10-19 LAB — URINALYSIS, COMPLETE (UACMP) WITH MICROSCOPIC
Bilirubin Urine: NEGATIVE
Glucose, UA: NEGATIVE mg/dL
Hgb urine dipstick: NEGATIVE
Ketones, ur: NEGATIVE mg/dL
Leukocytes,Ua: NEGATIVE
Nitrite: NEGATIVE
Protein, ur: NEGATIVE mg/dL
Specific Gravity, Urine: 1.025 (ref 1.005–1.030)
pH: 6 (ref 5.0–8.0)

## 2022-10-28 ENCOUNTER — Ambulatory Visit: Payer: PRIVATE HEALTH INSURANCE | Admitting: Urology

## 2023-03-14 ENCOUNTER — Emergency Department: Payer: PRIVATE HEALTH INSURANCE

## 2023-03-14 ENCOUNTER — Emergency Department
Admission: EM | Admit: 2023-03-14 | Discharge: 2023-03-14 | Disposition: A | Discharge status: Home / Self Care | Payer: PRIVATE HEALTH INSURANCE | Attending: Emergency Medicine | Admitting: Emergency Medicine

## 2023-03-14 DIAGNOSIS — R1030 Lower abdominal pain, unspecified: Secondary | ICD-10-CM | POA: Diagnosis present

## 2023-03-14 DIAGNOSIS — E119 Type 2 diabetes mellitus without complications: Secondary | ICD-10-CM | POA: Diagnosis not present

## 2023-03-14 DIAGNOSIS — R1084 Generalized abdominal pain: Secondary | ICD-10-CM | POA: Diagnosis not present

## 2023-03-14 DIAGNOSIS — M545 Low back pain, unspecified: Secondary | ICD-10-CM | POA: Insufficient documentation

## 2023-03-14 LAB — CBC
HCT: 37.5 % (ref 36.0–46.0)
Hemoglobin: 12.4 g/dL (ref 12.0–15.0)
MCH: 25.6 pg — ABNORMAL LOW (ref 26.0–34.0)
MCHC: 33.1 g/dL (ref 30.0–36.0)
MCV: 77.5 fL — ABNORMAL LOW (ref 80.0–100.0)
Platelets: 235 10*3/uL (ref 150–400)
RBC: 4.84 MIL/uL (ref 3.87–5.11)
RDW: 14.6 % (ref 11.5–15.5)
WBC: 7.4 10*3/uL (ref 4.0–10.5)
nRBC: 0 % (ref 0.0–0.2)

## 2023-03-14 LAB — COMPREHENSIVE METABOLIC PANEL
ALT: 12 U/L (ref 0–44)
AST: 16 U/L (ref 15–41)
Albumin: 3.6 g/dL (ref 3.5–5.0)
Alkaline Phosphatase: 45 U/L (ref 38–126)
Anion gap: 5 (ref 5–15)
BUN: 10 mg/dL (ref 6–20)
CO2: 26 mmol/L (ref 22–32)
Calcium: 8.8 mg/dL — ABNORMAL LOW (ref 8.9–10.3)
Chloride: 103 mmol/L (ref 98–111)
Creatinine, Ser: 0.83 mg/dL (ref 0.44–1.00)
GFR, Estimated: >60 mL/min (ref >60)
Glucose, Bld: 133 mg/dL — ABNORMAL HIGH (ref 70–99)
Potassium: 3.8 mmol/L (ref 3.5–5.1)
Sodium: 134 mmol/L — ABNORMAL LOW (ref 135–145)
Total Bilirubin: 0.5 mg/dL (ref 0.0–1.2)
Total Protein: 6.9 g/dL (ref 6.5–8.1)

## 2023-03-14 LAB — WET PREP, GENITAL
Clue Cells Wet Prep HPF POC: NONE SEEN
Sperm: NONE SEEN
Trich, Wet Prep: NONE SEEN
WBC, Wet Prep HPF POC: <10 (ref <10)
Yeast Wet Prep HPF POC: NONE SEEN

## 2023-03-14 LAB — POC URINE PREG, ED: Preg Test, Ur: NEGATIVE

## 2023-03-14 LAB — CHLAMYDIA/NGC RT PCR (ARMC ONLY)
Chlamydia Tr: NOT DETECTED
N gonorrhoeae: NOT DETECTED

## 2023-03-14 LAB — URINALYSIS, ROUTINE W REFLEX MICROSCOPIC
Bilirubin Urine: NEGATIVE
Glucose, UA: NEGATIVE mg/dL
Hgb urine dipstick: NEGATIVE
Ketones, ur: NEGATIVE mg/dL
Leukocytes,Ua: NEGATIVE
Nitrite: NEGATIVE
Protein, ur: NEGATIVE mg/dL
Specific Gravity, Urine: 1.01 (ref 1.005–1.030)
pH: 7 (ref 5.0–8.0)

## 2023-03-14 MED ORDER — KETOROLAC TROMETHAMINE 15 MG/ML IJ SOLN
15.0000 mg | Freq: Once | INTRAMUSCULAR | Status: AC
  Administered 2023-03-14: 15 mg via INTRAVENOUS
  Filled 2023-03-14: qty 1

## 2023-03-14 MED ORDER — OXYCODONE-ACETAMINOPHEN 5-325 MG PO TABS: 1.0000 | ORAL_TABLET | ORAL | 0 refills | Status: DC | PRN

## 2023-03-14 MED ORDER — IOHEXOL 300 MG/ML  SOLN
75.0000 mL | Freq: Once | INTRAMUSCULAR | Status: AC | PRN
  Administered 2023-03-14: 75 mL via INTRAVENOUS

## 2023-03-14 MED ORDER — MORPHINE SULFATE (PF) 4 MG/ML IV SOLN
4.0000 mg | Freq: Once | INTRAVENOUS | Status: AC
  Administered 2023-03-14: 4 mg via INTRAVENOUS
  Filled 2023-03-14: qty 1

## 2023-03-14 NOTE — ED Provider Notes (Signed)
 [facility] Provider Note    Event Date/Time   First MD Initiated Contact with Patient 03/14/23 [phone]     (approximate)   History   Abdominal Pain and Back Pain   HPI  Rose [name] is a 48 y.o. female   Past medical history of endometriosis, kidney stones, diabetes, GERD, here with bilateral flank pain, lower abdominal pain rating to the left groin down the left leg.  Coming up on nearly 1 week of symptoms.  No dysuria or frequency.  No nausea vomiting or diarrhea and her bowel movements have been normal and she is passing flatus.  She went to her primary doctor who prescribed her tramadol and Flomax with suspicion of renal colic but it did not provide much relief.  She notes that she had some white vaginal discharge 1 week ago but it is resolved.  She wants STI testing  External Medical Documents Reviewed: Urology note from October 2024 documented urologic history including nephrolithiasis with prior lithotripsy and stent placement      Physical Exam   Triage Vital Signs: ED Triage Vitals  Encounter Vitals Group     BP --      Systolic BP Percentile --      Diastolic BP Percentile --      Pulse Rate 03/14/23 0526 92     Resp 03/14/23 0526 18     Temp 03/14/23 0526 98.5 F (36.9 C)     Temp Source 03/14/23 0526 Oral     SpO2 03/14/23 0526 100 %     Weight --      Height --      Head Circumference --      Peak Flow --      Pain Score 03/14/23 0527 8     Pain Loc --      Pain Education --      Exclude from Growth Chart --     Most recent vital signs: Vitals:   03/14/23 0526 03/14/23 0614  BP:  130/81  Pulse: 92   Resp: 18   Temp: 98.5 F (36.9 C)   SpO2: 100%     General: Awake, no distress.  CV:  Good peripheral perfusion.  Resp:  Normal effort.  Abd:  No distention.  Other:  Bilateral paraspinal lumbar tenderness with no midline tenderness or deformity to the L-spine.  She has a soft abdomen is nondistended no masses but  is diffusely mildly tender across the lower quadrants bilaterally.  She appears comfortable and nontoxic and instructed.  She deferring my pelvic exam and is opting for self swab for STI   ED Results / Procedures / Treatments   Labs (all labs ordered are listed, but only abnormal results are displayed) Labs Reviewed  COMPREHENSIVE METABOLIC PANEL - Abnormal; Notable for the following components:      Result Value   Sodium 134 (*)    Glucose, Bld 133 (*)    Calcium 8.8 (*)    All other components within normal limits  CBC - Abnormal; Notable for the following components:   MCV 77.5 (*)    MCH 25.6 (*)    All other components within normal limits  URINALYSIS, ROUTINE W REFLEX MICROSCOPIC - Abnormal; Notable for the following components:   Color, Urine YELLOW (*)    APPearance HAZY (*)    All other components within normal limits  WET PREP, GENITAL  CHLAMYDIA/NGC RT PCR ([facility] ONLY)  POC URINE PREG, ED     I ordered and reviewed the above labs they are notable for negative for pregnancy    PROCEDURES:  Critical Care performed: No  Procedures   MEDICATIONS ORDERED IN ED: Medications  ketorolac (TORADOL) 15 MG/ML injection 15 mg (15 mg Intravenous Given 03/14/23 0609)  morphine (PF) 4 MG/ML injection 4 mg (4 mg Intravenous Given 03/14/23 0609)    IMPRESSION / MDM / ASSESSMENT AND PLAN / ED COURSE  I reviewed the triage vital signs and the nursing notes.                                Patient's presentation is most consistent with acute presentation with potential threat to life or bodily function.  Differential diagnosis includes, but is not limited to, renal colic, obstructive uropathy, urinary tract infection or pyelonephritis, intra-abdominal infection like diverticulitis or appendicitis, pregnancy related complication or ectopic pregnancy, endometriosis   The patient is on the cardiac monitor to evaluate for evidence of arrhythmia and/or significant heart  rate changes.  MDM:    Broad differential diagnosis lower abdominal pain and flank pain and a history of endometriosis and kidney stones these are leading differential diagnoses.  Will get a CT scan of the abdomen pelvis to assess for these pathologies or other surgical abdominal pathologies like appendicitis or to see if she has diverticulitis given her left lower quadrant pain as well.  She had some transient discharge and wants STI testing but wants to self swab and declines my pelvic exam.  She is not pregnant.  I will give her IV pain medications while workup as above is pending.   -- Labs unremarkable and patient stable at the time of signout 7 AM.  CT scan has yet to be performed, disposition will be pending the results of this imaging.  STI testing is pending as well, patient okay with checking the results on MyChart should they not result by the time of completion of her other workup in the emergency department.       FINAL CLINICAL IMPRESSION(S) / ED DIAGNOSES   Final diagnoses:  Generalized abdominal pain     Rx / DC Orders   ED Discharge Orders     None        Note:  This document was prepared using Dragon voice recognition software and may include unintentional dictation errors.    Pilar Jarvis, MD 03/14/23 [phone]

## 2023-03-14 NOTE — ED Triage Notes (Signed)
 Pt to ED from home with lower abdominal and back pain x1 week radiating down to the legs. Pt prescribed Flomax/Tramadol by PCP w/ no relief. Patient denies N/V/D/urinary symptoms.   Hx Endometriosis

## 2023-03-14 NOTE — ED Notes (Signed)
 Upreg POC is negative.

## 2023-03-14 NOTE — ED Provider Notes (Signed)
 Discussed results of CT imaging blood work urinalysis and STI testing with patient.  Workup is reassuring.  She does appear stable and appropriate for outpatient follow-up.  Will give short course of pain medication prescription.  Patient agreeable with plan.   Willy Eddy, MD 03/14/23 [phone]

## 2023-03-15 ENCOUNTER — Ambulatory Visit (INDEPENDENT_AMBULATORY_CARE_PROVIDER_SITE_OTHER): Payer: PRIVATE HEALTH INSURANCE | Admitting: Obstetrics

## 2023-03-15 ENCOUNTER — Other Ambulatory Visit (HOSPITAL_COMMUNITY)
Admission: RE | Admit: 2023-03-15 | Discharge: 2023-03-15 | Disposition: A | Discharge status: Home / Self Care | Payer: PRIVATE HEALTH INSURANCE | Source: Ambulatory Visit | Attending: Obstetrics | Admitting: Obstetrics

## 2023-03-15 ENCOUNTER — Encounter: Payer: Self-pay | Admitting: Obstetrics

## 2023-03-15 VITALS — BP 148/86 | HR 91 | Ht 64.0 in | Wt 179.0 lb

## 2023-03-15 DIAGNOSIS — R102 Pelvic and perineal pain: Secondary | ICD-10-CM

## 2023-03-15 DIAGNOSIS — Z124 Encounter for screening for malignant neoplasm of cervix: Secondary | ICD-10-CM

## 2023-03-15 DIAGNOSIS — R1084 Generalized abdominal pain: Secondary | ICD-10-CM

## 2023-03-15 DIAGNOSIS — R112 Nausea with vomiting, unspecified: Secondary | ICD-10-CM | POA: Diagnosis not present

## 2023-03-15 DIAGNOSIS — Z113 Encounter for screening for infections with a predominantly sexual mode of transmission: Secondary | ICD-10-CM | POA: Diagnosis present

## 2023-03-15 MED ORDER — ONDANSETRON 4 MG PO TBDP
4.0000 mg | ORAL_TABLET | Freq: Four times a day (QID) | ORAL | 0 refills | Status: DC | PRN
Start: 2023-03-15 — End: 2023-08-05

## 2023-03-15 NOTE — Progress Notes (Unsigned)
    GYNECOLOGY PROGRESS NOTE  Subjective:  PCP: Rose Leriche, MD  [name], female    DOB: September 12, 1975, 48 y.o.   MRN: [id number]  HPI  Patient is a 48 y.o. G35P1011 female who presents for pain with endometriosis.Been in pain since last week. Lower pelvic pain,  back, legs and knees pain. Reports no vaginal discharge, she was on doxycyline and not sure if that cleared up any infection she may have had. No dysuria but has urine frequency, She self swabbed at the ER and not sure if she done it correctly. Last pap was normal 06/23/2018. CT scan done yesterday shows hydronephrosis.   {Common ambulatory SmartLinks:19316}  Review of Systems {ros; complete:30496}   Objective:   Blood pressure (!) 148/86, pulse 91, height 5\' 4"  (1.626 m), weight 179 lb (81.2 kg). Body mass index is 30.73 kg/m.  General appearance: {general exam:16600} Abdomen: {abdominal exam:16834} Pelvic: {pelvic exam:16852::"cervix normal in appearance","external genitalia normal","no adnexal masses or tenderness","no cervical motion tenderness","rectovaginal septum normal","uterus normal size, shape, and consistency","vagina normal without discharge"} Extremities: {extremity exam:5109} Neurologic: {neuro exam:17854}   Assessment/Plan:   No diagnosis found.  L flank pain RLQ pain Nromal pelvic exam, reswabbed and pap done Vomiting in office ED overnight if worse Call PCP in am for appt asap  There are no diagnoses linked to this encounter.     Julieanne Manson, DO  [facility]

## 2023-03-16 ENCOUNTER — Encounter: Payer: Self-pay | Admitting: Obstetrics and Gynecology

## 2023-03-17 LAB — CYTOLOGY - PAP
Comment: NEGATIVE
Diagnosis: NEGATIVE
High risk HPV: NEGATIVE

## 2023-03-17 LAB — CERVICOVAGINAL ANCILLARY ONLY
Chlamydia: NEGATIVE
Comment: NEGATIVE
Comment: NEGATIVE
Comment: NORMAL
Neisseria Gonorrhea: NEGATIVE
Trichomonas: NEGATIVE

## 2023-03-18 MED ORDER — ORILISSA 150 MG PO TABS: 1.0000 | ORAL_TABLET | Freq: Every day | ORAL | 11 refills | Status: DC

## 2023-06-22 ENCOUNTER — Other Ambulatory Visit: Payer: Self-pay | Admitting: Emergency Medicine

## 2023-06-22 DIAGNOSIS — G44009 Cluster headache syndrome, unspecified, not intractable: Secondary | ICD-10-CM

## 2023-06-28 ENCOUNTER — Other Ambulatory Visit: Payer: PRIVATE HEALTH INSURANCE

## 2023-08-05 ENCOUNTER — Ambulatory Visit: Payer: PRIVATE HEALTH INSURANCE | Admitting: Obstetrics and Gynecology

## 2023-08-05 ENCOUNTER — Encounter: Payer: Self-pay | Admitting: Obstetrics and Gynecology

## 2023-08-05 VITALS — BP 128/81 | HR 99 | Ht 64.0 in | Wt 179.0 lb

## 2023-08-05 DIAGNOSIS — T8332XA Displacement of intrauterine contraceptive device, initial encounter: Secondary | ICD-10-CM | POA: Diagnosis not present

## 2023-08-05 DIAGNOSIS — R1032 Left lower quadrant pain: Secondary | ICD-10-CM

## 2023-08-05 DIAGNOSIS — T8332XD Displacement of intrauterine contraceptive device, subsequent encounter: Secondary | ICD-10-CM

## 2023-08-05 DIAGNOSIS — N939 Abnormal uterine and vaginal bleeding, unspecified: Secondary | ICD-10-CM | POA: Diagnosis not present

## 2023-08-05 MED ORDER — MISOPROSTOL 100 MCG PO TABS: 100.0000 ug | ORAL_TABLET | Freq: Once | ORAL | 0 refills | Status: DC

## 2023-08-05 NOTE — Progress Notes (Addendum)
 Rose Dorn Lunger, MD   Chief Complaint  Patient presents with   Pelvic Pain    Left side x 1 week.    HPI:      Ms. Rose [name] is a 48 y.o. G2P1011 whose LMP was Patient's last menstrual period was 07/11/2023 (approximate)., presents today for LLQ pain for the past 1 week, radiating to umbilicus. Is sharp crampy pain, intermittently daily for the past wk, similar to menstrual cramping but worse. Tried ibup without relief, waking her at night.  No GI, urin sx. Has had thicker white vag d/c without irritation/odor. 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 Had some sx improvement with orilissa  in past. Declines hyst if she can help it.  She also has a Mirena  IUD, placed 06/23/18. Initially had occas spotting but now bleeding every 2 wks, lasting 7 days, mod flow, no clots, with dysmen, not improved with heating pad/NSAIDs. Thinks IUD contributing to pain. Has never done OCPs/POPs for pelvic pain.  Neg pap 3/25. Neg CT abd/pelvis 3/25.  She is sexually active, no pain/bleeding/dryness.   Past Surgical History:  Procedure Laterality Date   CESAREAN SECTION     CHOLECYSTECTOMY     CYSTOSCOPY W/ RETROGRADES  06/29/2019   Procedure: CYSTOSCOPY WITH RETROGRADE PYELOGRAM;  Surgeon: Francisca Redell BROCKS, MD;  Location: [facility] OR[facility];  Service: Urology;;   CYSTOSCOPY WITH RETROGRADE PYELOGRAM, URETEROSCOPY AND STENT PLACEMENT Left 01/17/2019   Procedure: CYSTOSCOPY WITH RETROGRADE PYELOGRAM, URETEROSCOPY AND STENT PLACEMENT;  Surgeon: Twylla Glendia BROCKS, MD;  Location: [facility];  Service: Urology;  Laterality: Left;   CYSTOSCOPY/URETEROSCOPY/HOLMIUM LASER/STENT PLACEMENT Right 06/29/2019   Procedure: CYSTOSCOPY/URETEROSCOPY/HOLMIUM LASER/STENT PLACEMENT;  Surgeon: Francisca Redell BROCKS, MD;  Location: [facility];  Service: Urology;   Laterality: Right;   EXTRACORPOREAL SHOCK WAVE LITHOTRIPSY Right 06/01/2019   Procedure: EXTRACORPOREAL SHOCK WAVE LITHOTRIPSY (ESWL);  Surgeon: Francisca Redell BROCKS, MD;  Location: [facility];  Service: Urology;  Laterality: Right;   kidney stone removal  01/21, 05/21, 06/21   LAPAROSCOPY N/A 08/24/2013   Procedure: LAPAROSCOPY OPERATIVE with lysis of adhesions ;  Surgeon: Truman Corona, MD;  Location: [facility];  Service: Gynecology;  Laterality: N/A;   LAPAROSCOPY Left 07/13/2022   Procedure: LAPAROSCOPY DIAGNOSTIC, EXCISION OF ENDOMETRIOSIS;  Surgeon: Connell Davies, MD;  Location: [facility];  Service: Gynecology;  Laterality: Left;   STONE EXTRACTION WITH BASKET Left 01/17/2019   Procedure: STONE EXTRACTION WITH BASKET;  Surgeon: Twylla Glendia BROCKS, MD;  Location: [facility];  Service: Urology;  Laterality: Left;   wrist, cyst removal      Family History  Problem Relation Age of Onset   Aneurysm Mother    Blindness Father    High blood pressure Father    Hypertension Sister    Hypertension Sister    Diabetes Neg Hx     Social History   Socioeconomic History   Marital status: Single    Spouse name: Not on file   Number of children: 1   Years of education: GED   Highest education level: Not on file  Occupational History    Employer: [employer]  Tobacco Use   Smoking status: Former    Types: E-cigarettes    Passive exposure: Past   Smokeless tobacco: Never   Tobacco comments:    History of vaping.  Did not vape daily.  Did socially.  Started 2020, Stopped in 2022.    Vaping Use   Vaping status: Former   Substances: CBD  Substance and Sexual Activity   Alcohol use: Not Currently    Alcohol/week: 1.0 standard drink of alcohol    Types: 1 Standard drinks or equivalent per week    Comment: WINE OCC   Drug use: Not Currently    Types: Marijuana    Comment: last use 05/29/19   Sexual activity: Yes    Birth control/protection: I.U.D.    Comment: Mirena   Other Topics Concern   Not  on file  Social History Narrative   Patient is single and she takes care of her daughter. Patient works full time Engineer, civil (consulting).   Education GED.   Right handed.   Caffeine one cup of coffee daily and soda daily.   Children One       Social Drivers of Corporate investment banker Strain: Not on file  Food Insecurity: Not on file  Transportation Needs: Not on file  Physical Activity: Not on file  Stress: Not on file  Social Connections: Not on file  Intimate Partner Violence: Not on file    Outpatient Medications Prior to Visit  Medication Sig Dispense Refill   albuterol  (VENTOLIN  HFA) 108 (90 Base) MCG/ACT inhaler Inhale 2 puffs into the lungs every 6 (six) hours as needed for wheezing or shortness of breath. 8 g 0   Ascorbic Acid  (VITAMIN C) 1000 MG tablet Take 2,000 mg by mouth daily.     Berberine Chloride (BERBERINE HCI) 500 MG CAPS Take 1 tablet by mouth 2 (two) times daily.     Blood Glucose Monitoring Suppl (GLUCOCOM BLOOD GLUCOSE MONITOR) DEVI 1 each by Other route.     Carboxymethylcellul-Glycerin  (LUBRICATING EYE DROPS OP) Place 1 drop into both eyes daily as needed (dry eyes).     cholecalciferol  (VITAMIN D3) 25 MCG (1000 UNIT) tablet Take 3,000 Units by mouth daily.     cyclobenzaprine (FLEXERIL) 10 MG tablet Take 10 mg by mouth at bedtime.     docusate sodium  (COLACE) 100 MG capsule Take 1 capsule (100 mg total) by mouth 2 (two) times daily as needed. 30 capsule 1   EPINEPHrine 0.3 mg/0.3 mL IJ SOAJ injection Inject into the muscle.     erythromycin ophthalmic ointment SMARTSIG:Application In Eye(s) 3 Times Daily     glipiZIDE (GLUCOTROL) 5 MG tablet Take 5 mg by mouth every morning.     glucose blood (ONE TOUCH ULTRA TEST) test strip 1 each by Other route daily. And lancets 1/day 250.00 100 each 12   ibuprofen  (ADVIL ) 600 MG tablet Take 1 tablet (600 mg total) by mouth every 6 (six) hours as needed. 30 tablet 2   ipratropium-albuterol  (DUONEB) 0.5-2.5 (3) MG/3ML  SOLN Take 3 mLs by nebulization 3 (three) times daily as needed (shortness of breath, chest tightness, wheeze). 180 mL 1   ketoconazole (NIZORAL) 2 % cream Apply topically daily.     LANTUS SOLOSTAR 100 UNIT/ML Solostar Pen SMARTSIG:15 Unit(s) SUB-Q Daily     levonorgestrel  (MIRENA ) 20 MCG/24HR IUD 1 each by Intrauterine route once.     meloxicam (MOBIC) 15 MG tablet Take 15 mg by mouth daily as needed.     Multiple Vitamin (ONE-A-DAY ESSENTIAL PO) Take 1 tablet by mouth daily.     simethicone  (GAS-X) 80 MG chewable tablet Chew 1 tablet (80 mg total) by mouth 4 (four) times daily as  needed for flatulence (bloating). 30 tablet 1   traMADol (ULTRAM) 50 MG tablet SMARTSIG:1 Tablet(s) By Mouth Every 12 Hours PRN     traZODone  (DESYREL ) 50 MG tablet Take 50 mg by mouth at bedtime.     zinc  gluconate 50 MG tablet Take 50 mg by mouth daily.     Elagolix Sodium  (ORILISSA ) 150 MG TABS Take 1 tablet (150 mg total) by mouth daily. 30 tablet 11   insulin  detemir (LEVEMIR ) 100 UNIT/ML injection Inject 0.25 mLs (25 Units total) into the skin 2 (two) times daily. 10 mL 3   Insulin  Syringe-Needle U-100 (SAFETY INSULIN  SYRINGES) 30G X 5/16 0.5 ML MISC 1 Syringe by Does not apply route daily. 30 each 3   omeprazole  (PRILOSEC) 20 MG capsule Take 1 capsule (20 mg total) by mouth daily as needed (reflux). 90 capsule 3   ondansetron  (ZOFRAN -ODT) 4 MG disintegrating tablet Take 1 tablet (4 mg total) by mouth 4 (four) times daily as needed for nausea or vomiting. 30 tablet 0   oxyCODONE -acetaminophen  (PERCOCET) 5-325 MG tablet Take 1 tablet by mouth every 4 (four) hours as needed for severe pain (pain score 7-10). 8 tablet 0   No facility-administered medications prior to visit.      ROS:  Review of Systems  Constitutional:  Negative for fever.  Gastrointestinal:  Negative for blood in stool, constipation, diarrhea, nausea and vomiting.  Genitourinary:  Positive for menstrual problem and pelvic pain. Negative  for dyspareunia, dysuria, flank pain, frequency, hematuria, urgency, vaginal bleeding, vaginal discharge and vaginal pain.  Musculoskeletal:  Negative for back pain.  Skin:  Negative for rash.   BREAST: No symptoms   OBJECTIVE:   Vitals:  BP 128/81   Pulse 99   Ht 5' 4 (1.626 m)   Wt 179 lb (81.2 kg)   LMP 07/11/2023 (Approximate)   BMI 30.73 kg/m   Physical Exam Vitals reviewed.  Constitutional:      Appearance: She is well-developed.  Pulmonary:     Effort: Pulmonary effort is normal.  Abdominal:     Palpations: Abdomen is soft.     Tenderness: There is abdominal tenderness in the left lower quadrant. There is no guarding or rebound.   Genitourinary:    General: Normal vulva.     Pubic Area: No rash.      Labia:        Right: No rash, tenderness or lesion.        Left: No rash, tenderness or lesion.      Vagina: Normal. No vaginal discharge, erythema or tenderness.     Cervix: Normal.     Uterus: Normal. Tender. Not enlarged.      Adnexa: Right adnexa normal.       Right: No mass or tenderness.         Left: Tenderness present. No mass.       Comments: IUD STRINGS NOT IN CX OS Musculoskeletal:        General: Normal range of motion.     Cervical back: Normal range of motion.  Skin:    General: Skin is warm and dry.  Neurological:     General: No focal deficit present.     Mental Status: She is alert and oriented to person, place, and time.  Psychiatric:        Mood and Affect: Mood normal.        Behavior: Behavior normal.        Thought Content: Thought content normal.  Judgment: Judgment normal.     Assessment/Plan: LLQ pain--hx of CPP, sx for the past wk. Question adhesive disease or due to menses; suspicion of endometriosis. Pt wonders if related to IUD. Will RTO for IUD removal after taking cytotec  due to lost strings and closed internal cx os. Will see if sx improve if bleeding improves. If not, can add OCPs/POPs for menses (had side effects  with orilissa ). Declines hyst if she can help it.   Intrauterine contraceptive device threads lost, subsequent encounter - Plan: misoprostol  (CYTOTEC ) 100 MCG tablet; RTO for replacement after cytotec .   AUB--with IUD. See if sx improve with replacement. Neg imaging in recent past.   Meds ordered this encounter  Medications   misoprostol  (CYTOTEC ) 100 MCG tablet    Sig: Take 1 tablet (100 mcg total) by mouth once for 1 dose. 1 hour before appt    Dispense:  1 tablet    Refill:  0    Supervising Provider:   LEIGH SOBER [[id number]]     Return in about 1 week (around 08/12/2023) for Mirena  IUD replacement.  Armen Waring B. Mega Kinkade, PA-C 08/05/2023 5:07 PM

## 2023-08-05 NOTE — Patient Instructions (Signed)
 I value your feedback and you entrusting [place] with your care. If you get a [place] patient survey, I would appreciate you taking the time to let us know about your experience today. Thank you! ? ? ?

## 2023-09-05 ENCOUNTER — Ambulatory Visit (INDEPENDENT_AMBULATORY_CARE_PROVIDER_SITE_OTHER): Payer: PRIVATE HEALTH INSURANCE

## 2023-09-05 ENCOUNTER — Ambulatory Visit
Admission: EM | Admit: 2023-09-05 | Discharge: 2023-09-05 | Disposition: A | Discharge status: Home / Self Care | Payer: PRIVATE HEALTH INSURANCE | Attending: Family Medicine | Admitting: Family Medicine

## 2023-09-05 DIAGNOSIS — R0989 Other specified symptoms and signs involving the circulatory and respiratory systems: Secondary | ICD-10-CM

## 2023-09-05 DIAGNOSIS — J4531 Mild persistent asthma with (acute) exacerbation: Secondary | ICD-10-CM

## 2023-09-05 DIAGNOSIS — E119 Type 2 diabetes mellitus without complications: Secondary | ICD-10-CM | POA: Diagnosis not present

## 2023-09-05 DIAGNOSIS — B9789 Other viral agents as the cause of diseases classified elsewhere: Secondary | ICD-10-CM

## 2023-09-05 DIAGNOSIS — J988 Other specified respiratory disorders: Secondary | ICD-10-CM

## 2023-09-05 DIAGNOSIS — J4520 Mild intermittent asthma, uncomplicated: Secondary | ICD-10-CM | POA: Diagnosis not present

## 2023-09-05 DIAGNOSIS — Z794 Long term (current) use of insulin: Secondary | ICD-10-CM

## 2023-09-05 MED ORDER — ALBUTEROL SULFATE HFA 108 (90 BASE) MCG/ACT IN AERS: 2.0000 | INHALATION_SPRAY | Freq: Four times a day (QID) | RESPIRATORY_TRACT | 0 refills | Status: AC | PRN

## 2023-09-05 MED ORDER — PREDNISONE 10 MG PO TABS
30.0000 mg | ORAL_TABLET | Freq: Every day | ORAL | 0 refills | Status: DC
Start: 2023-09-05 — End: 2023-11-22

## 2023-09-05 NOTE — ED Provider Notes (Signed)
 [name]  Note:  This document was prepared using Conservation officer, historic buildings and may include unintentional dictation errors.  MRN: [id number] DOB: [birth year]  Subjective:   Rose [name] is a 48 y.o. female presenting for 2-3 day history of chest congestion, bilateral ear fullness, fatigue, shakiness of her body, mild intermittent shob. No fever, chest pain, wheezing. Has a history of asthma, used her albuterol  last week. No smoking of any kind including cigarettes, cigars, vaping, marijuana use.  Last cbg was 181 this morning, was fasting. DM treated with insulin .  She is compliant with her medications.  No current facility-administered medications for this encounter.  Current Outpatient Medications:    albuterol  (VENTOLIN  HFA) 108 (90 Base) MCG/ACT inhaler, Inhale 2 puffs into the lungs every 6 (six) hours as needed for wheezing or shortness of breath., Disp: 8 g, Rfl: 0   Ascorbic Acid  (VITAMIN C) 1000 MG tablet, Take 2,000 mg by mouth daily., Disp: , Rfl:    Berberine Chloride (BERBERINE HCI) 500 MG CAPS, Take 1 tablet by mouth 2 (two) times daily., Disp: , Rfl:    Blood Glucose Monitoring Suppl (GLUCOCOM BLOOD GLUCOSE MONITOR) DEVI, 1 each by Other route., Disp: , Rfl:    Carboxymethylcellul-Glycerin  (LUBRICATING EYE DROPS OP), Place 1 drop into both eyes daily as needed (dry eyes)., Disp: , Rfl:    cholecalciferol  (VITAMIN D3) 25 MCG (1000 UNIT) tablet, Take 3,000 Units by mouth daily., Disp: , Rfl:    cyclobenzaprine (FLEXERIL) 10 MG tablet, Take 10 mg by mouth at bedtime., Disp: , Rfl:    docusate sodium  (COLACE) 100 MG capsule, Take 1 capsule (100 mg total) by mouth 2 (two) times daily as needed., Disp: 30 capsule, Rfl: 1   EPINEPHrine 0.3 mg/0.3 mL IJ SOAJ injection, Inject into the muscle., Disp: , Rfl:    erythromycin ophthalmic ointment, SMARTSIG:Application In Eye(s) 3 Times Daily, Disp: , Rfl:    glipiZIDE (GLUCOTROL) 5 MG tablet, Take 5  mg by mouth every morning., Disp: , Rfl:    glucose blood (ONE TOUCH ULTRA TEST) test strip, 1 each by Other route daily. And lancets 1/day 250.00, Disp: 100 each, Rfl: 12   ibuprofen  (ADVIL ) 600 MG tablet, Take 1 tablet (600 mg total) by mouth every 6 (six) hours as needed., Disp: 30 tablet, Rfl: 2   ipratropium-albuterol  (DUONEB) 0.5-2.5 (3) MG/3ML SOLN, Take 3 mLs by nebulization 3 (three) times daily as needed (shortness of breath, chest tightness, wheeze)., Disp: 180 mL, Rfl: 1   ketoconazole (NIZORAL) 2 % cream, Apply topically daily., Disp: , Rfl:    LANTUS SOLOSTAR 100 UNIT/ML Solostar Pen, SMARTSIG:15 Unit(s) SUB-Q Daily, Disp: , Rfl:    levonorgestrel  (MIRENA ) 20 MCG/24HR IUD, 1 each by Intrauterine route once., Disp: , Rfl:    meloxicam (MOBIC) 15 MG tablet, Take 15 mg by mouth daily as needed., Disp: , Rfl:    misoprostol  (CYTOTEC ) 100 MCG tablet, Take 1 tablet (100 mcg total) by mouth once for 1 dose. 1 hour before appt, Disp: 1 tablet, Rfl: 0   Multiple Vitamin (ONE-A-DAY ESSENTIAL PO), Take 1 tablet by mouth daily., Disp: , Rfl:    simethicone  (GAS-X) 80 MG chewable tablet, Chew 1 tablet (80 mg total) by mouth 4 (four) times daily as needed for flatulence (bloating)., Disp: 30 tablet, Rfl: 1   traMADol (ULTRAM) 50 MG tablet, SMARTSIG:1 Tablet(s) By Mouth Every 12 Hours PRN, Disp: , Rfl:    traZODone  (DESYREL ) 50 MG tablet, Take  50 mg by mouth at bedtime., Disp: , Rfl:    zinc  gluconate 50 MG tablet, Take 50 mg by mouth daily., Disp: , Rfl:    Allergies  Allergen Reactions   Bactrim [Sulfamethoxazole-Trimethoprim] Swelling   Neurontin  [Gabapentin ] Itching   Sulfa Antibiotics Other (See Comments)    Past Medical History:  Diagnosis Date   ADHD    Anxiety    Diabetes mellitus without complication (HCC)    Family history of adverse reaction to anesthesia    SISTER-TOO MUCH ANESTHESIA FOR C-SECTION   GERD (gastroesophageal reflux disease)    History of kidney stones    Neck  pain    Right leg pain      Past Surgical History:  Procedure Laterality Date   CESAREAN SECTION     CHOLECYSTECTOMY     CYSTOSCOPY W/ RETROGRADES  06/29/2019   Procedure: CYSTOSCOPY WITH RETROGRADE PYELOGRAM;  Surgeon: Francisca Redell BROCKS, MD;  Location: [facility];  Service: Urology;;   CYSTOSCOPY WITH RETROGRADE PYELOGRAM, URETEROSCOPY AND STENT PLACEMENT Left 01/17/2019   Procedure: CYSTOSCOPY WITH RETROGRADE PYELOGRAM, URETEROSCOPY AND STENT PLACEMENT;  Surgeon: Twylla Glendia BROCKS, MD;  Location: [facility];  Service: Urology;  Laterality: Left;   CYSTOSCOPY/URETEROSCOPY/HOLMIUM LASER/STENT PLACEMENT Right 06/29/2019   Procedure: CYSTOSCOPY/URETEROSCOPY/HOLMIUM LASER/STENT PLACEMENT;  Surgeon: Francisca Redell BROCKS, MD;  Location: [facility];  Service: Urology;  Laterality: Right;   EXTRACORPOREAL SHOCK WAVE LITHOTRIPSY Right 06/01/2019   Procedure: EXTRACORPOREAL SHOCK WAVE LITHOTRIPSY (ESWL);  Surgeon: Francisca Redell BROCKS, MD;  Location: [facility];  Service: Urology;  Laterality: Right;   kidney stone removal  01/21, 05/21, 06/21   LAPAROSCOPY N/A 08/24/2013   Procedure: LAPAROSCOPY OPERATIVE with lysis of adhesions ;  Surgeon: Truman Corona, MD;  Location: [facility];  Service: Gynecology;  Laterality: N/A;   LAPAROSCOPY Left 07/13/2022   Procedure: LAPAROSCOPY DIAGNOSTIC, EXCISION OF ENDOMETRIOSIS;  Surgeon: Connell Davies, MD;  Location: [facility];  Service: Gynecology;  Laterality: Left;   STONE EXTRACTION WITH BASKET Left 01/17/2019   Procedure: STONE EXTRACTION WITH BASKET;  Surgeon: Twylla Glendia BROCKS, MD;  Location: [facility];  Service: Urology;  Laterality: Left;   wrist, cyst removal      Family History  Problem Relation Age of Onset   Aneurysm Mother    Blindness Father    High blood pressure Father    Hypertension Sister    Hypertension Sister    Diabetes Neg Hx     Social History   Tobacco Use   Smoking status: Former    Types: E-cigarettes    Passive exposure: Past   Smokeless tobacco:  Never   Tobacco comments:    History of vaping.  Did not vape daily.  Did socially.  Started 2020, Stopped in 2022.    Vaping Use   Vaping status: Former   Substances: CBD  Substance Use Topics   Alcohol use: Yes    Alcohol/week: 1.0 standard drink of alcohol    Types: 1 Standard drinks or equivalent per week    Comment: WINE OCC   Drug use: Not Currently    Types: Marijuana    Comment: last use 05/29/19    ROS   Objective:   Vitals: BP (!) 152/103 (BP Location: Right Arm)   Pulse 99   Temp 98.7 F (37.1 C) (Oral)   Resp 20   SpO2 95%   BP Readings from Last 3 Encounters:  09/05/23 (!) 152/103  08/05/23 128/81  03/15/23 (!) 148/86    Physical Exam Constitutional:  General: She is not in acute distress.    Appearance: Normal appearance. She is well-developed and normal weight. She is not ill-appearing, toxic-appearing or diaphoretic.  HENT:     Head: Normocephalic and atraumatic.     Right Ear: Ear canal and external ear normal. No drainage or tenderness. No middle ear effusion. There is no impacted cerumen. Tympanic membrane is not erythematous or bulging.     Left Ear: Ear canal and external ear normal. No drainage or tenderness.  No middle ear effusion. There is no impacted cerumen. Tympanic membrane is not erythematous or bulging.     Ears:     Comments: Bilateral middle ear effusions.    Nose: Congestion present. No rhinorrhea.     Mouth/Throat:     Mouth: Mucous membranes are moist. No oral lesions.     Pharynx: No pharyngeal swelling, oropharyngeal exudate, posterior oropharyngeal erythema or uvula swelling.     Tonsils: No tonsillar exudate or tonsillar abscesses.  Eyes:     General: No scleral icterus.       Right eye: No discharge.        Left eye: No discharge.     Extraocular Movements: Extraocular movements intact.     Right eye: Normal extraocular motion.     Left eye: Normal extraocular motion.     Conjunctiva/sclera: Conjunctivae normal.   Cardiovascular:     Rate and Rhythm: Normal rate and regular rhythm.     Heart sounds: Normal heart sounds. No murmur heard.    No friction rub. No gallop.  Pulmonary:     Effort: Pulmonary effort is normal. No respiratory distress.     Breath sounds: No stridor. Wheezing (trace over mid lung fields bilaterally) present. No rhonchi or rales.  Chest:     Chest wall: No tenderness.  Musculoskeletal:     Cervical back: Normal range of motion and neck supple.  Lymphadenopathy:     Cervical: No cervical adenopathy.  Skin:    General: Skin is warm and dry.  Neurological:     General: No focal deficit present.     Mental Status: She is alert and oriented to person, place, and time.  Psychiatric:        Mood and Affect: Mood normal.        Behavior: Behavior normal.    DG Chest 2 View Result Date: 09/05/2023 CLINICAL DATA:  48 year old female with chest congestion and fatigue. Status post COVID-19. Earlier this month. EXAM: CHEST - 2 VIEW COMPARISON:  Chest radiographs 05/01/2022 and earlier. FINDINGS: PA and lateral views ear 0851 hours. Lung volumes and mediastinal contours are stable since 2022, normal. Visualized tracheal air column is within normal limits. Lung markings are stable, within normal limits. No pneumothorax, pleural effusion or confluent lung opacity. Stable cholecystectomy clips. Negative visible bowel gas. No acute osseous abnormality identified. IMPRESSION: No acute cardiopulmonary abnormality. Electronically Signed   By: VEAR Hurst M.D.   On: 09/05/2023 09:00   Assessment and Plan :   PDMP not reviewed this encounter.  1. Viral respiratory infection   2. Chest congestion   3. Mild intermittent asthma, unspecified whether complicated   4. Mild persistent asthma with (acute) exacerbation   5. Type 2 diabetes mellitus treated with insulin  (HCC)    Patient is undergoing a new viral respiratory infection likely worsened by her underlying asthma, diabetes and also send COVID  infection.  She does not practice a diabetic friendly or hypertensive friendly diet.  Emphasized need to  make this lifestyle change for the long-term but also to avoid complications from the use of prednisone  which would help her the best through her respiratory symptoms in the context of her underlying conditions.  Chest x-ray negative.  You supportive care otherwise.  Counseled patient on potential for adverse effects with medications prescribed/recommended today, ER and return-to-clinic precautions discussed, patient verbalized understanding.    Christopher Savannah, [place] 09/05/23 [phone]

## 2023-09-05 NOTE — Discharge Instructions (Addendum)
 We will manage this as a viral respiratory infection likely worsened from your asthma and recent COVID infection. For sore throat or cough try using a honey-based tea. Use 3 teaspoons of honey with juice squeezed from half lemon. Place shaved pieces of ginger into 1/2-1 cup of water and warm over stove top. Then mix the ingredients and repeat every 4 hours as needed. Please take Tylenol  500mg -650mg  once every 6 hours for fevers, aches and pains. Hydrate very well with at least 2 liters (64 ounces) of water. Eat light meals such as soups (chicken and noodles, chicken wild rice, vegetable).  Do not eat any foods that you are allergic to.  Start an antihistamine like Zyrtec (10mg  daily) for postnasal drainage, sinus congestion.  You can take this together with prednisone  at a lower dose of 30mg  for 5 days.   Contact your PCP to see if you can increase your insulin  dosing temporarily while you take a steroid course.   For diabetes or elevated blood sugar, please make sure you are limiting and avoiding starchy, carbohydrate foods like pasta, breads, sweet breads, pastry, rice, potatoes, desserts. These foods can elevate your blood sugar. Also, limit and avoid drinks that contain a lot of sugar such as sodas, sweet teas, fruit juices.  Drinking plain water will be much more helpful, try 64 ounces of water daily.  It is okay to flavor your water naturally by cutting cucumber, lemon, mint or lime, placing it in a picture with water and drinking it over a period of 24-48 hours as long as it remains refrigerated.  For elevated blood pressure, make sure you are monitoring salt in your diet.  Do not eat restaurant foods and limit processed foods at home. I highly recommend you prepare and cook your own foods at home.  Processed foods include things like frozen meals, pre-seasoned meats and dinners, deli meats, canned foods as these foods contain a high amount of sodium/salt.  Make sure you are paying attention to sodium  labels on foods you buy at the grocery store. Buy your spices separately such as garlic powder, onion powder, cumin, cayenne, parsley flakes so that you can avoid seasonings that contain salt. However, salt-free seasonings are available and can be used, an example is Mrs. Dash and includes a lot of different mixtures that do not contain salt.  Lastly, when cooking using oils that are healthier for you is important. This includes olive oil, avocado oil, canola oil. We have discussed a lot of foods to avoid but below is a list of foods that can be very healthy to use in your diet whether it is for diabetes, cholesterol, high blood pressure, or in general healthy eating.  Salads - kale, spinach, cabbage, spring mix, arugula Fruits - avocadoes, berries (blueberries, raspberries, blackberries), apples, oranges, pomegranate, grapefruit, kiwi Vegetables - asparagus, cauliflower, broccoli, green beans, brussel sprouts, bell peppers, beets; stay away from or limit starchy vegetables like potatoes, carrots, peas Other general foods - kidney beans, egg whites, almonds, walnuts, sunflower seeds, pumpkin seeds, fat free yogurt, almond milk, flax seeds, quinoa, oats  Meat - It is better to eat lean meats and limit your red meat including pork to once a week.  Wild caught fish, chicken breast are good options as they tend to be leaner sources of good protein. Still be mindful of the sodium labels for the meats you buy.  DO NOT EAT ANY FOODS ON THIS LIST THAT YOU ARE ALLERGIC TO. For more specific needs,  I highly recommend consulting a dietician or nutritionist but this can definitely be a good starting point.

## 2023-09-05 NOTE — ED Triage Notes (Signed)
 Pt chest congestion, body aches, low energy, jittering electrify, clogged ears x 2 days. Reports she had COVID on 08/12/23 and took Azithromycin, ivermectin and Zofran 

## 2023-11-01 ENCOUNTER — Other Ambulatory Visit: Payer: Self-pay | Admitting: Emergency Medicine

## 2023-11-01 DIAGNOSIS — Z1231 Encounter for screening mammogram for malignant neoplasm of breast: Secondary | ICD-10-CM

## 2023-11-04 ENCOUNTER — Other Ambulatory Visit: Payer: Self-pay | Admitting: Emergency Medicine

## 2023-11-04 DIAGNOSIS — N63 Unspecified lump in unspecified breast: Secondary | ICD-10-CM

## 2023-11-08 ENCOUNTER — Other Ambulatory Visit: Payer: Self-pay | Admitting: *Deleted

## 2023-11-08 ENCOUNTER — Inpatient Hospital Stay
Admission: RE | Admit: 2023-11-08 | Discharge: 2023-11-08 | Disposition: A | Payer: Self-pay | Source: Ambulatory Visit | Attending: Emergency Medicine | Admitting: Emergency Medicine

## 2023-11-08 ENCOUNTER — Inpatient Hospital Stay
Admission: RE | Admit: 2023-11-08 | Discharge: 2023-11-08 | Disposition: A | Discharge status: Home / Self Care | Payer: Self-pay | Source: Ambulatory Visit | Attending: Emergency Medicine | Admitting: Emergency Medicine

## 2023-11-08 DIAGNOSIS — Z1231 Encounter for screening mammogram for malignant neoplasm of breast: Secondary | ICD-10-CM

## 2023-11-11 ENCOUNTER — Ambulatory Visit
Admission: RE | Admit: 2023-11-11 | Discharge: 2023-11-11 | Disposition: A | Discharge status: Home / Self Care | Payer: PRIVATE HEALTH INSURANCE | Source: Ambulatory Visit | Attending: Emergency Medicine | Admitting: Emergency Medicine

## 2023-11-11 ENCOUNTER — Inpatient Hospital Stay
Admission: RE | Admit: 2023-11-11 | Discharge: 2023-11-11 | Payer: PRIVATE HEALTH INSURANCE | Attending: Emergency Medicine | Admitting: Emergency Medicine

## 2023-11-11 DIAGNOSIS — N63 Unspecified lump in unspecified breast: Secondary | ICD-10-CM | POA: Diagnosis present

## 2023-11-12 ENCOUNTER — Encounter: Payer: Self-pay | Admitting: Emergency Medicine

## 2023-11-15 ENCOUNTER — Other Ambulatory Visit: Payer: Self-pay | Admitting: Emergency Medicine

## 2023-11-15 DIAGNOSIS — R928 Other abnormal and inconclusive findings on diagnostic imaging of breast: Secondary | ICD-10-CM

## 2023-11-17 ENCOUNTER — Ambulatory Visit
Admission: RE | Admit: 2023-11-17 | Discharge: 2023-11-17 | Disposition: A | Discharge status: Home / Self Care | Payer: PRIVATE HEALTH INSURANCE | Source: Ambulatory Visit | Attending: Emergency Medicine | Admitting: Emergency Medicine

## 2023-11-17 DIAGNOSIS — R928 Other abnormal and inconclusive findings on diagnostic imaging of breast: Secondary | ICD-10-CM | POA: Diagnosis present

## 2023-11-17 DIAGNOSIS — N6325 Unspecified lump in the left breast, overlapping quadrants: Secondary | ICD-10-CM | POA: Insufficient documentation

## 2023-11-17 MED ORDER — LIDOCAINE 1 % OPTIME INJ - NO CHARGE
2.0000 mL | Freq: Once | INTRAMUSCULAR | Status: AC
  Administered 2023-11-17: 2 mL
  Filled 2023-11-17: qty 2

## 2023-11-17 MED ORDER — LIDOCAINE-EPINEPHRINE 1 %-1:100000 IJ SOLN
3.0000 mL | Freq: Once | INTRAMUSCULAR | Status: AC
  Administered 2023-11-17: 3 mL
  Filled 2023-11-17: qty 3

## 2023-11-18 ENCOUNTER — Telehealth: Payer: Self-pay

## 2023-11-18 NOTE — Telephone Encounter (Signed)
 Patient left msg on triage requesting call back. Has appointment on 11/22/23 for IUD removal. Was prescribed misoprostol  (CYTOTEC ) 100 MCG tablet and wants to know how and when she should take it? Called her back, no answer, LVMTRC. Instructions on Rx say: Take 1 tablet (100 mcg total) by mouth once for 1 dose. 1 hour before appt - Oral.

## 2023-11-19 ENCOUNTER — Telehealth: Payer: Self-pay

## 2023-11-19 NOTE — Telephone Encounter (Signed)
 Patient called to find out if she should take her Misoprostol  by mouth of sublingually prior to her procedure.  Advised Alicia would like her to take it by mouth 1 hour prior to appointment.  She will take it at 2:35pm 11/22/23.

## 2023-11-22 ENCOUNTER — Encounter: Payer: Self-pay | Admitting: Obstetrics and Gynecology

## 2023-11-22 ENCOUNTER — Ambulatory Visit: Payer: PRIVATE HEALTH INSURANCE | Admitting: Obstetrics and Gynecology

## 2023-11-22 VITALS — BP 113/76 | HR 96 | Ht 64.0 in | Wt 175.0 lb

## 2023-11-22 DIAGNOSIS — R102 Pelvic and perineal pain unspecified side: Secondary | ICD-10-CM

## 2023-11-22 DIAGNOSIS — Z30433 Encounter for removal and reinsertion of intrauterine contraceptive device: Secondary | ICD-10-CM

## 2023-11-22 DIAGNOSIS — N939 Abnormal uterine and vaginal bleeding, unspecified: Secondary | ICD-10-CM | POA: Diagnosis not present

## 2023-11-22 DIAGNOSIS — N898 Other specified noninflammatory disorders of vagina: Secondary | ICD-10-CM | POA: Diagnosis not present

## 2023-11-22 DIAGNOSIS — Z113 Encounter for screening for infections with a predominantly sexual mode of transmission: Secondary | ICD-10-CM

## 2023-11-22 MED ORDER — LEVONORGESTREL 20 MCG/DAY IU IUD
1.0000 | INTRAUTERINE_SYSTEM | Freq: Once | INTRAUTERINE | Status: AC
  Administered 2023-11-22: 1 via INTRAUTERINE

## 2023-11-22 NOTE — Progress Notes (Signed)
 Chief Complaint  Patient presents with   IUD replacement     History of Present Illness:  Rose [name] is a 48 y.o. that had a Mirena  IUD placed 06/23/18. Had a little spotting initially but now BTB couple times a month, light flow, lasting a few days to a week, with dysmen, not improved with ibup. Also with pelvic pressure, not related to bleeding. LLQ pain from 08/05/23 resolved. Pt was sexually active until recently, no pain/bleeding/dryness then. Neg CT abd/pelvis 3/25.  Has noticed increased vag d/c without irritation/itching/odor; partner recently broke up with her. Would like STD testing. Was on abx recently.   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 Had some sx improvement with orilissa  in past. Declines hyst if she can help it.  She also has a Mirena  IUD, placed 06/23/18. Initially had occas spotting but now bleeding every 2 wks, lasting 7 days, mod flow, no clots, with dysmen, not improved with heating pad/NSAIDs. Thinks IUD contributing to pain. Has never done OCPs/POPs for pelvic pain.   Past Medical History:  Diagnosis Date   ADHD    Anxiety    Diabetes mellitus without complication (HCC)    Family history of adverse reaction to anesthesia    SISTER-TOO MUCH ANESTHESIA FOR C-SECTION   GERD (gastroesophageal reflux disease)    History of kidney stones    Neck pain    Right leg pain    Past Surgical History:  Procedure Laterality Date   CESAREAN SECTION     CHOLECYSTECTOMY     CYSTOSCOPY W/ RETROGRADES  06/29/2019   Procedure: CYSTOSCOPY WITH RETROGRADE PYELOGRAM;  Surgeon: Francisca Redell BROCKS, MD;  Location: [facility];  Service: Urology;;   CYSTOSCOPY WITH RETROGRADE PYELOGRAM, URETEROSCOPY AND STENT PLACEMENT Left 01/17/2019   Procedure: CYSTOSCOPY WITH RETROGRADE PYELOGRAM,  URETEROSCOPY AND STENT PLACEMENT;  Surgeon: Twylla Glendia BROCKS, MD;  Location: [facility];  Service: Urology;  Laterality: Left;   CYSTOSCOPY/URETEROSCOPY/HOLMIUM LASER/STENT PLACEMENT Right 06/29/2019   Procedure: CYSTOSCOPY/URETEROSCOPY/HOLMIUM LASER/STENT PLACEMENT;  Surgeon: Francisca Redell BROCKS, MD;  Location: [facility];  Service: Urology;  Laterality: Right;   EXTRACORPOREAL SHOCK WAVE LITHOTRIPSY Right 06/01/2019   Procedure: EXTRACORPOREAL SHOCK WAVE LITHOTRIPSY (ESWL);  Surgeon: Francisca Redell BROCKS, MD;  Location: [facility];  Service: Urology;  Laterality: Right;   kidney stone removal  01/21, 05/21, 06/21   LAPAROSCOPY N/A 08/24/2013   Procedure: LAPAROSCOPY OPERATIVE with lysis of adhesions ;  Surgeon: Truman Corona, MD;  Location: [facility];  Service: Gynecology;  Laterality: N/A;   LAPAROSCOPY Left 07/13/2022   Procedure: LAPAROSCOPY DIAGNOSTIC, EXCISION OF ENDOMETRIOSIS;  Surgeon: Connell Davies, MD;  Location: [facility];  Service: Gynecology;  Laterality: Left;   STONE EXTRACTION WITH BASKET Left 01/17/2019   Procedure: STONE EXTRACTION WITH BASKET;  Surgeon: Twylla Glendia BROCKS, MD;  Location: [facility];  Service: Urology;  Laterality: Left;   wrist, cyst removal     Family History  Problem Relation Age of Onset   Aneurysm Mother    Blindness Father    High blood pressure Father    Hypertension Sister    Hypertension Sister    Diabetes Neg Hx    Review of Systems  Constitutional:  Negative for fever.  Gastrointestinal:  Negative for blood in stool, constipation, diarrhea, nausea and vomiting.  Genitourinary:  Positive for vaginal bleeding and vaginal discharge. Negative for dyspareunia, dysuria, flank pain, frequency, hematuria, urgency and vaginal pain.  Musculoskeletal:  Negative for back pain.  Skin:  Negative for rash.     BP 113/76   Pulse 96   Ht 5' 4 (1.626 m)   Wt 175 lb (79.4 kg)   BMI 30.04 kg/m    Physical Exam Constitutional:      General: She is not in acute  distress. Genitourinary:     Vulva normal.     Genitourinary Comments: WHITE VAG D/C; IUD STRINGS NOT IN CX OS     Right Labia: No rash, tenderness or lesions.    Left Labia: No tenderness, lesions or rash.    Vaginal discharge present.     No vaginal erythema, tenderness or bleeding.      Right Adnexa: not tender and no mass present.    Left Adnexa: not tender and no mass present.    No cervical motion tenderness or friability.     No IUD strings visualized.     Uterus is not enlarged or tender.  Pulmonary:     Effort: Pulmonary effort is normal.  Musculoskeletal:        General: Normal range of motion.  Neurological:     General: No focal deficit present.     Mental Status: She is alert.     Cranial Nerves: No cranial nerve deficit.  Skin:    General: Skin is warm and dry.  Psychiatric:        Mood and Affect: Mood normal.        Behavior: Behavior normal.        Thought Content: Thought content normal.        Judgment: Judgment normal.  Vitals and nursing note reviewed.     IUD Removal Strings of IUD identified and grasped.  IUD removed without problem with ring forceps.  Pt tolerated this well.  IUD noted to be intact.  IUD Insertion Procedure Note Patient identified, informed consent performed, consent signed.   Discussed risks of irregular bleeding, cramping, infection, malpositioning or misplacement of the IUD outside the uterus which may require further procedure such as laparoscopy, risk of failure <1%. Time out was performed.    Speculum placed in the vagina.  Cervix visualized.  Cleaned with Betadine x 2.  Grasped anteriorly with a single tooth tenaculum.  Uterus sounded to 10.0 cm.   IUD placed per manufacturer's recommendations.  Strings trimmed to 3 cm. Tenaculum was removed, good hemostasis noted.  Patient tolerated procedure well.   ASSESSMENT: Pelvic pressure in female--see if IUD replacement improves sx. If not, can check Gyn u/s. Neg CT 3/25. Hx of  suspected endometriosis and adhesive disease.  Abnormal uterine bleeding (AUB)--with IUD, see if sx improve after IUD replacement. F/u prn.   Vaginal discharge - Plan: NuSwab Vaginitis Plus (VG+); check nuswab, will f/u if abn. Recent abx use, no itching.   Screening for STD (sexually transmitted disease) - Plan: NuSwab Vaginitis Plus (VG+)  Encounter for removal and reinsertion of intrauterine contraceptive device (IUD) - Plan: levonorgestrel  (MIRENA ) 20 MCG/DAY IUD 1 each   Meds ordered this encounter  Medications   levonorgestrel  (MIRENA ) 20 MCG/DAY IUD 1 each    Plan:  Patient was given post-procedure instructions.  She was advised to have backup contraception for one week.   Call if you are having increasing pain, cramps or bleeding or if you have a fever greater than 100.4 degrees F.,  shaking chills, nausea or vomiting. Patient was also asked to check IUD strings periodically and follow up in 4 weeks for IUD check.  Return in about 4 weeks (around 12/20/2023) for IUD f/u.  Danner Paulding B. Dyesha Henault, PA-C 11/22/2023 4:17 PM

## 2023-11-22 NOTE — Patient Instructions (Signed)
 I value your feedback and you entrusting [place] with your care. If you get a  patient survey, I would appreciate you taking the time to let us know about your experience today. Thank you!  Instructions after IUD insertion  Most women experience no significant problems after insertion of an IUD, however minor cramping and spotting for a few days is common. Cramps may be treated with ibuprofen 800mg  every 8 hours or Tylenol 650 mg every 4 hours. Contact [facility] immediately if you experience any of the following symptoms during the next week: temperature >99.6 degrees, worsening pelvic pain, abdominal pain, fainting, unusually heavy vaginal bleeding, foul vaginal discharge, or if you think you have expelled the IUD. Nothing inserted in the vagina for 48 hours. You will be scheduled for a follow up visit in approximately four weeks.

## 2023-11-24 LAB — NUSWAB VAGINITIS PLUS (VG+)
Candida albicans, NAA: NEGATIVE
Candida glabrata, NAA: NEGATIVE
Chlamydia trachomatis, NAA: NEGATIVE
Neisseria gonorrhoeae, NAA: NEGATIVE
Trich vag by NAA: NEGATIVE

## 2023-12-20 ENCOUNTER — Encounter: Payer: Self-pay | Admitting: Obstetrics

## 2023-12-20 ENCOUNTER — Ambulatory Visit: Payer: PRIVATE HEALTH INSURANCE | Admitting: Obstetrics

## 2023-12-20 VITALS — BP 133/84 | HR 96 | Ht 64.0 in | Wt 179.0 lb

## 2023-12-20 DIAGNOSIS — Z30431 Encounter for routine checking of intrauterine contraceptive device: Secondary | ICD-10-CM

## 2023-12-20 MED ORDER — FLUCONAZOLE 150 MG PO TABS: 150.0000 mg | ORAL_TABLET | Freq: Once | ORAL | 0 refills | Status: AC

## 2023-12-20 NOTE — Progress Notes (Signed)
° ° °  GYNECOLOGY OFFICE ENCOUNTER NOTE  History:  48 y.o. G2P1011 here today for today for IUD string check; Mirena   IUD was placed  11/22/23. She reports bothersome spotting, but it is manageable. She denies pelvic pain. She does have some intermittent cramping.  The following portions of the patient's history were reviewed and updated as appropriate: allergies, current medications, past family history, past medical history, past social history, past surgical history and problem list. Last pap smear on 03/15/23 was normal, negative HRHPV.  Review of Systems:  Pertinent items are noted in HPI.  Objective:  Blood pressure 133/84, pulse 96, height 5' 4 (1.626 m), weight 179 lb (81.2 kg).  Physical Exam CONSTITUTIONAL: Well-developed, well-nourished female in no acute distress.  NEUROLOGIC: Alert and oriented to person, place, and time. Normal reflexes, muscle tone coordination.  ABDOMEN: Soft, no distention noted.   PELVIC: Normal appearing external genitalia; normal appearing vaginal mucosa and cervix.  IUD strings visualized, about 3 cm in length outside cervix. Moderate amount of yellowish, curd-like discharge  Assessment & Plan:  IUD in place Patient to keep IUD in place for up to 8 years; can come in for removal if she desires pregnancy earlier or for any concerning side effects. Discharge, h/o recurrent yeast infections. Not currently having any bothersome symptoms but does have a h/o yeast. Diflucan  sent in for Tomiko to take if she becomes symptomatic with yeast infection.  Terrell Shimko CNM [facility]

## 2024-01-18 ENCOUNTER — Other Ambulatory Visit (HOSPITAL_COMMUNITY)
Admission: RE | Admit: 2024-01-18 | Discharge: 2024-01-18 | Disposition: A | Discharge status: Home / Self Care | Payer: PRIVATE HEALTH INSURANCE | Source: Ambulatory Visit | Attending: Licensed Practical Nurse | Admitting: Licensed Practical Nurse

## 2024-01-18 ENCOUNTER — Ambulatory Visit (INDEPENDENT_AMBULATORY_CARE_PROVIDER_SITE_OTHER): Payer: PRIVATE HEALTH INSURANCE

## 2024-01-18 VITALS — BP 135/88 | HR 94 | Ht 64.0 in | Wt 179.6 lb

## 2024-01-18 DIAGNOSIS — Z113 Encounter for screening for infections with a predominantly sexual mode of transmission: Secondary | ICD-10-CM | POA: Diagnosis present

## 2024-01-18 NOTE — Progress Notes (Signed)
" ° ° °  NURSE VISIT NOTE  Subjective:    [name], female    DOB: 1975-08-02, 49 y.o.   MRN: [id number]  HPI  Patient is a 49 y.o. G57P1011 female who presents for vaginal swab, she states she just coming out of a relationship and wants to be checked for any STDs. Patient has a history of known exposure to STD.   Objective:    Ht 5' 4 (1.626 m)   Wt 179 lb 9.6 oz (81.5 kg)   BMI 30.83 kg/m      Assessment:   1. Screening examination for STD (sexually transmitted disease)       Plan:   GC and chlamydia DNA  probe sent to lab. Treatment: waiting on results ROV prn if symptoms persist or worsen.   Burnard LITTIE Ro, CMA  "

## 2024-01-19 LAB — HEP, RPR, HIV PANEL
HIV Screen 4th Generation wRfx: NONREACTIVE
Hepatitis B Surface Ag: NEGATIVE
RPR Ser Ql: NONREACTIVE

## 2024-01-20 ENCOUNTER — Ambulatory Visit: Payer: Self-pay | Admitting: Licensed Practical Nurse

## 2024-01-20 ENCOUNTER — Other Ambulatory Visit: Payer: Self-pay | Admitting: Licensed Practical Nurse

## 2024-01-20 DIAGNOSIS — B9689 Other specified bacterial agents as the cause of diseases classified elsewhere: Secondary | ICD-10-CM

## 2024-01-20 DIAGNOSIS — B3731 Acute candidiasis of vulva and vagina: Secondary | ICD-10-CM

## 2024-01-20 LAB — CERVICOVAGINAL ANCILLARY ONLY
Bacterial Vaginitis (gardnerella): POSITIVE — AB
Candida Glabrata: POSITIVE — AB
Candida Vaginitis: POSITIVE — AB
Chlamydia: NEGATIVE
Comment: NEGATIVE
Comment: NEGATIVE
Comment: NEGATIVE
Comment: NEGATIVE
Comment: NEGATIVE
Comment: NORMAL
Neisseria Gonorrhea: NEGATIVE
Trichomonas: NEGATIVE

## 2024-01-20 MED ORDER — METRONIDAZOLE 500 MG PO TABS: 500.0000 mg | ORAL_TABLET | Freq: Two times a day (BID) | ORAL | 0 refills | Status: AC

## 2024-01-20 MED ORDER — FLUCONAZOLE 150 MG PO TABS: 150.0000 mg | ORAL_TABLET | ORAL | 0 refills | Status: AC

## 2024-01-20 NOTE — Progress Notes (Signed)
 Self swab shows BV, yeast, and candida glabrata Script for flagyl  and Diflucan  sent Mychart message sent  Jinnie Cookey, [place]  [facility] 01/20/24  5:05 PM

## 2024-02-28 ENCOUNTER — Ambulatory Visit: Payer: PRIVATE HEALTH INSURANCE | Admitting: Certified Nurse Midwife
